# Patient Record
Sex: Male | Born: 1935
Health system: Southern US, Community
[De-identification: ages and names within clinical notes are randomized; demographics above are authoritative.]

## PROBLEM LIST (undated history)

## (undated) DIAGNOSIS — I350 Nonrheumatic aortic (valve) stenosis: Secondary | ICD-10-CM

## (undated) DIAGNOSIS — E785 Hyperlipidemia, unspecified: Secondary | ICD-10-CM

## (undated) DIAGNOSIS — I5032 Chronic diastolic (congestive) heart failure: Secondary | ICD-10-CM

## (undated) DIAGNOSIS — I10 Essential (primary) hypertension: Secondary | ICD-10-CM

## (undated) DIAGNOSIS — M4800 Spinal stenosis, site unspecified: Secondary | ICD-10-CM

## (undated) HISTORY — PX: TONSILLECTOMY: SUR1361

## (undated) HISTORY — DX: Essential (primary) hypertension: I10

## (undated) HISTORY — PX: EYE SURGERY: SHX253

## (undated) HISTORY — DX: Hyperlipidemia, unspecified: E78.5

## (undated) HISTORY — PX: BIOPSY BREAST: PRO8

---

## 2006-11-02 ENCOUNTER — Encounter: Admission: RE | Admit: 2006-11-02 | Discharge: 2006-11-02 | Payer: Self-pay | Admitting: Internal Medicine

## 2012-01-30 ENCOUNTER — Other Ambulatory Visit: Payer: Self-pay | Admitting: Oncology

## 2017-04-22 ENCOUNTER — Ambulatory Visit
Admission: RE | Admit: 2017-04-22 | Discharge: 2017-04-22 | Disposition: A | Discharge status: Home / Self Care | Payer: Medicare Other | Source: Ambulatory Visit | Attending: Internal Medicine | Admitting: Internal Medicine

## 2017-04-22 ENCOUNTER — Other Ambulatory Visit: Payer: Self-pay | Admitting: Internal Medicine

## 2017-04-22 DIAGNOSIS — K5900 Constipation, unspecified: Secondary | ICD-10-CM

## 2017-05-13 ENCOUNTER — Emergency Department (HOSPITAL_COMMUNITY): Payer: Medicare Other

## 2017-05-13 ENCOUNTER — Emergency Department (HOSPITAL_COMMUNITY)
Admission: EM | Admit: 2017-05-13 | Discharge: 2017-05-13 | Disposition: A | Discharge status: Home / Self Care | Payer: Medicare Other | Attending: Emergency Medicine | Admitting: Emergency Medicine

## 2017-05-13 ENCOUNTER — Other Ambulatory Visit: Payer: Self-pay

## 2017-05-13 ENCOUNTER — Encounter (HOSPITAL_COMMUNITY): Payer: Self-pay | Admitting: *Deleted

## 2017-05-13 DIAGNOSIS — R1084 Generalized abdominal pain: Secondary | ICD-10-CM | POA: Diagnosis not present

## 2017-05-13 DIAGNOSIS — K5900 Constipation, unspecified: Secondary | ICD-10-CM | POA: Diagnosis not present

## 2017-05-13 DIAGNOSIS — I10 Essential (primary) hypertension: Secondary | ICD-10-CM | POA: Insufficient documentation

## 2017-05-13 DIAGNOSIS — R0789 Other chest pain: Secondary | ICD-10-CM | POA: Insufficient documentation

## 2017-05-13 DIAGNOSIS — R109 Unspecified abdominal pain: Secondary | ICD-10-CM | POA: Diagnosis present

## 2017-05-13 DIAGNOSIS — I159 Secondary hypertension, unspecified: Secondary | ICD-10-CM

## 2017-05-13 HISTORY — DX: Spinal stenosis, site unspecified: M48.00

## 2017-05-13 LAB — BASIC METABOLIC PANEL WITH GFR
Anion gap: 9 (ref 5–15)
BUN: 20 mg/dL (ref 6–20)
CO2: 23 mmol/L (ref 22–32)
Calcium: 9.5 mg/dL (ref 8.9–10.3)
Chloride: 104 mmol/L (ref 101–111)
Creatinine, Ser: 1.17 mg/dL (ref 0.61–1.24)
GFR calc Af Amer: >60 mL/min
GFR calc non Af Amer: 55 mL/min — ABNORMAL LOW
Glucose, Bld: 119 mg/dL — ABNORMAL HIGH (ref 65–99)
Potassium: 4 mmol/L (ref 3.5–5.1)
Sodium: 136 mmol/L (ref 135–145)

## 2017-05-13 LAB — URINALYSIS, ROUTINE W REFLEX MICROSCOPIC
Bilirubin Urine: NEGATIVE
GLUCOSE, UA: NEGATIVE mg/dL
Hgb urine dipstick: NEGATIVE
Ketones, ur: NEGATIVE mg/dL
LEUKOCYTES UA: NEGATIVE
Nitrite: NEGATIVE
PROTEIN: NEGATIVE mg/dL
Specific Gravity, Urine: 1.025 (ref 1.005–1.030)
pH: 5 (ref 5.0–8.0)

## 2017-05-13 LAB — HEPATIC FUNCTION PANEL
ALBUMIN: 3.8 g/dL (ref 3.5–5.0)
ALK PHOS: 59 U/L (ref 38–126)
ALT: 13 U/L — ABNORMAL LOW (ref 17–63)
AST: 21 U/L (ref 15–41)
BILIRUBIN INDIRECT: 0.8 mg/dL (ref 0.3–0.9)
Bilirubin, Direct: 0.2 mg/dL (ref 0.1–0.5)
TOTAL PROTEIN: 6.4 g/dL — AB (ref 6.5–8.1)
Total Bilirubin: 1 mg/dL (ref 0.3–1.2)

## 2017-05-13 LAB — CBC
HCT: 38.8 % — ABNORMAL LOW (ref 39.0–52.0)
HEMOGLOBIN: 12.9 g/dL — AB (ref 13.0–17.0)
MCH: 30.6 pg (ref 26.0–34.0)
MCHC: 33.2 g/dL (ref 30.0–36.0)
MCV: 91.9 fL (ref 78.0–100.0)
Platelets: 214 10*3/uL (ref 150–400)
RBC: 4.22 MIL/uL (ref 4.22–5.81)
RDW: 13.4 % (ref 11.5–15.5)
WBC: 6.8 10*3/uL (ref 4.0–10.5)

## 2017-05-13 LAB — LIPASE, BLOOD: LIPASE: 40 U/L (ref 11–51)

## 2017-05-13 LAB — TROPONIN I: Troponin I: <0.03 ng/mL

## 2017-05-13 LAB — I-STAT TROPONIN, ED: TROPONIN I, POC: 0.02 ng/mL (ref 0.00–0.08)

## 2017-05-13 MED ORDER — MAGNESIUM CITRATE PO SOLN
1.0000 | Freq: Once | ORAL | Status: AC
  Administered 2017-05-13: 1 via ORAL
  Filled 2017-05-13: qty 296

## 2017-05-13 MED ORDER — IOPAMIDOL (ISOVUE-300) INJECTION 61%
INTRAVENOUS | Status: AC
  Administered 2017-05-13: 100 mL
  Filled 2017-05-13: qty 100

## 2017-05-13 NOTE — ED Triage Notes (Addendum)
Pt c/o "gas pains;" then tonight had pain in bilateral arms. Pt denies chest pain. Wife reports pt has "narrowing in the aorta" and has been told by pcp that if pt has CP to come to the ER. Wife also reports pt has had swelling in hands and feet  Pt denies any pain or discomfort at present

## 2017-05-13 NOTE — Discharge Instructions (Signed)
Please follow up with Dr. Valentina Lucks regarding your CT findings.  Take magnesium citrate, half a bottle today and another half tomorrow. You can also do enemas at home to help relieve your bowels. Discuss with Dr. Valentina Lucks your blood pressure medications.

## 2017-05-13 NOTE — ED Notes (Signed)
Patient transported to CT 

## 2017-05-13 NOTE — ED Provider Notes (Signed)
[facility]-EMERGENCY DEPT Provider Note   CSN: [id number] Arrival date & time: 05/13/17  0208     History   Chief Complaint Chief Complaint  Patient presents with  . Abdominal Pain  . Chest Pain    HPI Marcus Powers is a 83 y.o. male.  HPI  Marcus Powers is a 83 y.o. male with history of hypertension, presents to emergency department complaining of abdominal pain and chest discomfort. Patient states that he has been having issues with constipation over the last month. He has been to his primary care doctor and is taking MiraLAX and a laxative. He states he also had an enema 2 weeks ago. He states his constipation is not improving. He states that last night he was in bed, and developed diffuse abdominal discomfort that radiated into his chest. He also stated that he had some tingling in bilateral arms which alarmed him and he decided to come to emergency department. He does not remember last time he had a bowel movement. He also reports urinary frequency and states he has to sit down on the toilet to urinate because sometimes he is unable to control it. He denies any nausea or vomiting. No fever or chills. He denies any chest pain at this time. He also admits to swelling and bilateral hands and ankles over the last few weeks. He states that his doctor did take him off of one of the blood pressure medicines and he has been drinking more fluids. No medications taken prior to her coming in.   Past Medical History:  Diagnosis Date  . Spinal stenosis     There are no active problems to display for this patient.   Past Surgical History:  Procedure Laterality Date  . BIOPSY BREAST         Home Medications    Prior to Admission medications   Not on File    Family History No family history on file.  Social History Social History  Substance Use Topics  . Smoking status: Never Smoker  . Smokeless tobacco: Never Used  . Alcohol use No     Allergies   Patient has no known  allergies.   Review of Systems Review of Systems  Constitutional: Negative for chills and fever.  Respiratory: Negative for cough, chest tightness and shortness of breath.   Cardiovascular: Positive for chest pain. Negative for palpitations and leg swelling.  Gastrointestinal: Positive for abdominal pain and constipation. Negative for abdominal distention, diarrhea, nausea and vomiting.  Genitourinary: Positive for frequency and urgency. Negative for dysuria, hematuria, scrotal swelling and testicular pain.  Musculoskeletal: Negative for arthralgias, myalgias, neck pain and neck stiffness.  Skin: Negative for rash.  Allergic/Immunologic: Negative for immunocompromised state.  Neurological: Negative for dizziness, weakness, light-headedness, numbness and headaches.  All other systems reviewed and are negative.    Physical Exam Updated Vital Signs BP (!) 174/75   Pulse 75   Temp 98 F (36.7 C) (Oral)   Resp 15   SpO2 97%   Physical Exam  Constitutional: He is oriented to person, place, and time. He appears well-developed and well-nourished. No distress.  HENT:  Head: Normocephalic and atraumatic.  Eyes: Conjunctivae are normal.  Neck: Neck supple.  Cardiovascular: Normal rate, regular rhythm, normal heart sounds and intact distal pulses.   No murmur heard. Pulmonary/Chest: Effort normal. No respiratory distress. He has no wheezes. He has no rales.  Abdominal: Soft. Bowel sounds are normal. He exhibits no distension. There is tenderness. There is no rebound.  Diffuse tenderness  Musculoskeletal: He exhibits no edema.  Neurological: He is alert and oriented to person, place, and time.  Skin: Skin is warm and dry.  Nursing note and vitals reviewed.    ED Treatments / Results  Labs (all labs ordered are listed, but only abnormal results are displayed) Labs Reviewed  BASIC METABOLIC PANEL - Abnormal; Notable for the following:       Result Value   Glucose, Bld 119 (*)     GFR calc non Af Amer 55 (*)    All other components within normal limits  CBC - Abnormal; Notable for the following:    Hemoglobin 12.9 (*)    HCT 38.8 (*)    All other components within normal limits  URINALYSIS, ROUTINE W REFLEX MICROSCOPIC  HEPATIC FUNCTION PANEL  LIPASE, BLOOD  I-STAT TROPONIN, ED  I-STAT TROPONIN, ED    EKG  EKG Interpretation None       Radiology Dg Chest 2 View  Result Date: 05/13/2017 CLINICAL DATA:  Acute onset of generalized chest pain, radiating to the shoulder. Initial encounter. EXAM: CHEST  2 VIEW COMPARISON:  None. FINDINGS: The lungs are well-aerated and clear. There is no evidence of focal opacification, pleural effusion or pneumothorax. The heart is normal in size; the mediastinal contour is within normal limits. No acute osseous abnormalities are seen. IMPRESSION: No acute cardiopulmonary process seen. Electronically Signed   By: Roanna Raider M.D.   On: 05/13/2017 02:44    Procedures Procedures (including critical care time)  Medications Ordered in ED Medications - No data to display   Initial Impression / Assessment and Plan / ED Course  I have reviewed the triage vital signs and the nursing notes.  Pertinent labs & imaging results that were available during my care of the patient were reviewed by me and considered in my medical decision making (see chart for details).     Patient in emergency department with abdominal pain and chest pain. He has been in department for almost 8 hours prior to me seeing him. He is hypertensive, otherwise vital signs have remained normal. His abdomen is diffusely tender, question constipation. Given severity of the pain earlier, will get CT abdomen and pelvis for further evaluation. Will repeat troponin, initial was negative. Also will add hepatic function panel and lipase and UA   12:10 PM CT is with no acute findings other than constipation. Nonemergent findings were discussed with patient and he will  follow-up with family doctor for further evaluation. Discussed with Dr. Fayrene Fearing who has seen patient as well. Will treat with magnesium citrate for constipation, at home enemas as needed. Discussed blood pressure control, patient was recently taken off of some blood pressure medications, advised him to speak with Dr. Valentina Lucks to see if he needs to restart that. Otherwise patient is in no acute distress. 2 troponins are negative. I do not think this is cardiac. Will have him follow-up closely. Return precautions discussed.  Vitals:   05/13/17 0221 05/13/17 0720 05/13/17 0915 05/13/17 1210  BP: (!) 163/80 (!) 158/64 (!) 174/75 (!) 166/76  Pulse: 83 77 75 66  Resp: 18 (!) 24 15 17   Temp: 98 F (36.7 C)     TempSrc: Oral     SpO2: 100% 100% 97% 98%     Final Clinical Impressions(s) / ED Diagnoses   Final diagnoses:  Generalized abdominal pain  Constipation, unspecified constipation type  Secondary hypertension  Atypical chest pain    New Prescriptions New Prescriptions  No medications on file     Jaynie Crumble, Cordelia Poche 05/13/17 1759    Rolland Porter, MD 05/14/17 1043

## 2019-12-01 ENCOUNTER — Ambulatory Visit: Payer: Medicare Other

## 2020-10-23 DIAGNOSIS — E7800 Pure hypercholesterolemia, unspecified: Secondary | ICD-10-CM | POA: Diagnosis not present

## 2020-10-23 DIAGNOSIS — I10 Essential (primary) hypertension: Secondary | ICD-10-CM | POA: Diagnosis not present

## 2020-12-25 DIAGNOSIS — G3184 Mild cognitive impairment, so stated: Secondary | ICD-10-CM | POA: Diagnosis not present

## 2020-12-26 DIAGNOSIS — E7800 Pure hypercholesterolemia, unspecified: Secondary | ICD-10-CM | POA: Diagnosis not present

## 2020-12-26 DIAGNOSIS — I10 Essential (primary) hypertension: Secondary | ICD-10-CM | POA: Diagnosis not present

## 2021-02-09 ENCOUNTER — Emergency Department (HOSPITAL_COMMUNITY)
Admission: EM | Admit: 2021-02-09 | Discharge: 2021-02-10 | Disposition: A | Discharge status: Home / Self Care | Payer: Medicare Other | Attending: Emergency Medicine | Admitting: Emergency Medicine

## 2021-02-09 ENCOUNTER — Encounter (HOSPITAL_COMMUNITY): Payer: Self-pay

## 2021-02-09 ENCOUNTER — Emergency Department (HOSPITAL_COMMUNITY): Payer: Medicare Other

## 2021-02-09 ENCOUNTER — Other Ambulatory Visit: Payer: Self-pay

## 2021-02-09 DIAGNOSIS — R062 Wheezing: Secondary | ICD-10-CM | POA: Insufficient documentation

## 2021-02-09 DIAGNOSIS — R0602 Shortness of breath: Secondary | ICD-10-CM | POA: Diagnosis not present

## 2021-02-09 DIAGNOSIS — Z20822 Contact with and (suspected) exposure to covid-19: Secondary | ICD-10-CM | POA: Insufficient documentation

## 2021-02-09 DIAGNOSIS — R609 Edema, unspecified: Secondary | ICD-10-CM

## 2021-02-09 DIAGNOSIS — E8770 Fluid overload, unspecified: Secondary | ICD-10-CM | POA: Insufficient documentation

## 2021-02-09 DIAGNOSIS — R600 Localized edema: Secondary | ICD-10-CM | POA: Diagnosis not present

## 2021-02-09 DIAGNOSIS — J9811 Atelectasis: Secondary | ICD-10-CM | POA: Diagnosis not present

## 2021-02-09 DIAGNOSIS — R011 Cardiac murmur, unspecified: Secondary | ICD-10-CM | POA: Diagnosis not present

## 2021-02-09 MED ORDER — ALBUTEROL SULFATE HFA 108 (90 BASE) MCG/ACT IN AERS
2.0000 | INHALATION_SPRAY | RESPIRATORY_TRACT | Status: DC | PRN
  Administered 2021-02-09: 2 via RESPIRATORY_TRACT
  Filled 2021-02-09: qty 6.7

## 2021-02-09 NOTE — ED Triage Notes (Signed)
Patient reports shortness of breathing starting yesterday but worsening this morning, denies any chest pain.

## 2021-02-10 ENCOUNTER — Telehealth: Payer: Self-pay

## 2021-02-10 DIAGNOSIS — R600 Localized edema: Secondary | ICD-10-CM | POA: Diagnosis not present

## 2021-02-10 DIAGNOSIS — R0602 Shortness of breath: Secondary | ICD-10-CM | POA: Diagnosis not present

## 2021-02-10 LAB — COMPREHENSIVE METABOLIC PANEL
ALT: 14 U/L (ref 0–44)
AST: 16 U/L (ref 15–41)
Albumin: 3.7 g/dL (ref 3.5–5.0)
Alkaline Phosphatase: 56 U/L (ref 38–126)
Anion gap: 6 (ref 5–15)
BUN: 14 mg/dL (ref 8–23)
CO2: 24 mmol/L (ref 22–32)
Calcium: 9.1 mg/dL (ref 8.9–10.3)
Chloride: 109 mmol/L (ref 98–111)
Creatinine, Ser: 1.26 mg/dL — ABNORMAL HIGH (ref 0.61–1.24)
GFR, Estimated: 54 mL/min — ABNORMAL LOW (ref >60)
Glucose, Bld: 129 mg/dL — ABNORMAL HIGH (ref 70–99)
Potassium: 3.4 mmol/L — ABNORMAL LOW (ref 3.5–5.1)
Sodium: 139 mmol/L (ref 135–145)
Total Bilirubin: 0.9 mg/dL (ref 0.3–1.2)
Total Protein: 6.5 g/dL (ref 6.5–8.1)

## 2021-02-10 LAB — CBC WITH DIFFERENTIAL/PLATELET
Abs Immature Granulocytes: 0.01 10*3/uL (ref 0.00–0.07)
Basophils Absolute: 0.1 10*3/uL (ref 0.0–0.1)
Basophils Relative: 1 %
Eosinophils Absolute: 0.1 10*3/uL (ref 0.0–0.5)
Eosinophils Relative: 1 %
HCT: 35.9 % — ABNORMAL LOW (ref 39.0–52.0)
Hemoglobin: 11.6 g/dL — ABNORMAL LOW (ref 13.0–17.0)
Immature Granulocytes: 0 %
Lymphocytes Relative: 16 %
Lymphs Abs: 1.1 10*3/uL (ref 0.7–4.0)
MCH: 31.5 pg (ref 26.0–34.0)
MCHC: 32.3 g/dL (ref 30.0–36.0)
MCV: 97.6 fL (ref 80.0–100.0)
Monocytes Absolute: 0.8 10*3/uL (ref 0.1–1.0)
Monocytes Relative: 12 %
Neutro Abs: 4.6 10*3/uL (ref 1.7–7.7)
Neutrophils Relative %: 70 %
Platelets: 188 10*3/uL (ref 150–400)
RBC: 3.68 MIL/uL — ABNORMAL LOW (ref 4.22–5.81)
RDW: 13.8 % (ref 11.5–15.5)
WBC: 6.7 10*3/uL (ref 4.0–10.5)
nRBC: 0 % (ref 0.0–0.2)

## 2021-02-10 LAB — TROPONIN I (HIGH SENSITIVITY)
Troponin I (High Sensitivity): 61 ng/L — ABNORMAL HIGH (ref <18)
Troponin I (High Sensitivity): 61 ng/L — ABNORMAL HIGH (ref <18)

## 2021-02-10 LAB — RESP PANEL BY RT-PCR (FLU A&B, COVID) ARPGX2
Influenza A by PCR: NEGATIVE
Influenza B by PCR: NEGATIVE
SARS Coronavirus 2 by RT PCR: NEGATIVE

## 2021-02-10 LAB — BRAIN NATRIURETIC PEPTIDE: B Natriuretic Peptide: 826.7 pg/mL — ABNORMAL HIGH (ref 0.0–100.0)

## 2021-02-10 LAB — D-DIMER, QUANTITATIVE: D-Dimer, Quant: 0.59 ug/mL-FEU — ABNORMAL HIGH (ref 0.00–0.50)

## 2021-02-10 MED ORDER — FUROSEMIDE 40 MG PO TABS: 40.0000 mg | ORAL_TABLET | Freq: Every day | ORAL | 0 refills | Status: DC

## 2021-02-10 MED ORDER — FUROSEMIDE 10 MG/ML IJ SOLN
40.0000 mg | Freq: Once | INTRAMUSCULAR | Status: AC
  Administered 2021-02-10: 40 mg via INTRAVENOUS
  Filled 2021-02-10: qty 4

## 2021-02-10 MED ORDER — POTASSIUM CHLORIDE CRYS ER 20 MEQ PO TBCR: 40.0000 meq | EXTENDED_RELEASE_TABLET | Freq: Every day | ORAL | 0 refills | Status: DC

## 2021-02-10 NOTE — Telephone Encounter (Signed)
Ms ClPP lled to state her husbands prescriptions were never called in. In reviewing the chart her scripts were pritned. She went over the packet, they were not in there. This RNCM called them in to the pharmacy as written.

## 2021-02-10 NOTE — ED Provider Notes (Signed)
[facility] EMERGENCY DEPARTMENT Provider Note   CSN: [id number] Arrival date & time: 02/09/21  2224     History Chief Complaint  Patient presents with  . Shortness of Breath    Marcus Powers is a 87 y.o. male.  87 year old male who presents emerged from today with shortness of breath.  His wife is with him and states her last couple days of progressively worsening audible wheezing.  Never anything like that before.  Does not have a history of smoking.  No recent illnesses.  No cough or fevers.  When I noticed the lower extremity swelling he states that that been going on here recently.  The wife states that she is not sure how long its been present   Shortness of Breath      Past Medical History:  Diagnosis Date  . Spinal stenosis     There are no problems to display for this patient.   Past Surgical History:  Procedure Laterality Date  . BIOPSY BREAST         History reviewed. No pertinent family history.  Social History   Tobacco Use  . Smoking status: Never Smoker  . Smokeless tobacco: Never Used  Substance Use Topics  . Alcohol use: No  . Drug use: No    Home Medications Prior to Admission medications   Medication Sig Start Date End Date Taking? Authorizing Provider  acetaminophen (TYLENOL) 500 MG tablet Take 1,000 mg by mouth every 6 (six) hours as needed for mild pain.   Yes [provider]  furosemide (LASIX) 40 MG tablet Take 1 tablet (40 mg total) by mouth daily for 5 days. 02/10/21 02/15/21 Yes Sheddrick Lattanzio, Barbara Cower, MD  hydrochlorothiazide (HYDRODIURIL) 12.5 MG tablet Take 12.5 mg by mouth daily. 12/31/20  Yes [provider]  losartan (COZAAR) 100 MG tablet Take 100 mg by mouth daily. 12/31/20  Yes [provider]  potassium chloride SA (KLOR-CON) 20 MEQ tablet Take 2 tablets (40 mEq total) by mouth daily for 5 days. 02/10/21 02/15/21 Yes Artia Singley, Barbara Cower, MD  pravastatin (PRAVACHOL) 20 MG tablet Take 20 mg by mouth  daily. 12/31/20  Yes [provider]    Allergies    Patient has no known allergies.  Review of Systems   Review of Systems  Respiratory: Positive for shortness of breath.   All other systems reviewed and are negative.   Physical Exam Updated Vital Signs BP (!) 149/96   Pulse 89   Temp 98.1 F (36.7 C) (Oral)   Resp (!) 21   Ht 5\' 5"  (1.651 m)   Wt 73.5 kg   SpO2 99%   BMI 26.96 kg/m   Physical Exam Vitals and nursing note reviewed.  Constitutional:      Appearance: He is well-developed.  HENT:     Head: Normocephalic and atraumatic.  Neck:     Vascular: JVD present.  Cardiovascular:     Rate and Rhythm: Normal rate.     Heart sounds: Murmur heard.    Pulmonary:     Effort: Pulmonary effort is normal. No respiratory distress.     Breath sounds: Examination of the right-lower field reveals rales. Examination of the left-lower field reveals rales. Rales present.  Abdominal:     General: There is no distension.  Musculoskeletal:        General: Normal range of motion.     Cervical back: Normal range of motion.     Right lower leg: Edema present.  Left lower leg: Edema present.  Skin:    General: Skin is warm and dry.  Neurological:     Mental Status: He is alert.     ED Results / Procedures / Treatments   Labs (all labs ordered are listed, but only abnormal results are displayed) Labs Reviewed  CBC WITH DIFFERENTIAL/PLATELET - Abnormal; Notable for the following components:      Result Value   RBC 3.68 (*)    Hemoglobin 11.6 (*)    HCT 35.9 (*)    All other components within normal limits  COMPREHENSIVE METABOLIC PANEL - Abnormal; Notable for the following components:   Potassium 3.4 (*)    Glucose, Bld 129 (*)    Creatinine, Ser 1.26 (*)    GFR, Estimated 54 (*)    All other components within normal limits  BRAIN NATRIURETIC PEPTIDE - Abnormal; Notable for the following components:   B Natriuretic Peptide 826.7 (*)    All other  components within normal limits  D-DIMER, QUANTITATIVE - Abnormal; Notable for the following components:   D-Dimer, Quant 0.59 (*)    All other components within normal limits  TROPONIN I (HIGH SENSITIVITY) - Abnormal; Notable for the following components:   Troponin I (High Sensitivity) 61 (*)    All other components within normal limits  TROPONIN I (HIGH SENSITIVITY) - Abnormal; Notable for the following components:   Troponin I (High Sensitivity) 61 (*)    All other components within normal limits  RESP PANEL BY RT-PCR (FLU A&B, COVID) [id number]    EKG EKG Interpretation  Date/Time:  Saturday Feb 09 2021 22:35:17 EDT Ventricular Rate:  92 PR Interval:  164 QRS Duration: 88 QT Interval:  378 QTC Calculation: 467 R Axis:   60 Text Interpretation: Normal sinus rhythm Possible Left atrial enlargement Left ventricular hypertrophy with repolarization abnormality ( Sokolow-Lyon ) Abnormal ECG Confirmed by Marily Memos [phone]) on 02/09/2021 11:21:49 PM   Radiology DG Chest 2 View  Result Date: 02/09/2021 CLINICAL DATA:  Shortness of breath EXAM: CHEST - 2 VIEW COMPARISON:  05/13/2017 FINDINGS: Cardiac shadow is within normal limits. Aortic calcifications are noted. Lungs are well aerated bilaterally. Very mild left basilar atelectasis is noted posteriorly. No sizable effusion noted. No acute bony abnormality is seen. IMPRESSION: Mild left basilar atelectasis. Electronically Signed   By: Alcide Clever M.D.   On: 02/09/2021 22:57    Procedures Procedures   Medications Ordered in ED Medications  albuterol (VENTOLIN HFA) 108 (90 Base) MCG/ACT inhaler 2 puff (2 puffs Inhalation Given 02/09/21 2349)  furosemide (LASIX) injection 40 mg (40 mg Intravenous Given 02/10/21 0142)    ED Course  I have reviewed the triage vital signs and the nursing notes.  Pertinent labs & imaging results that were available during my care of the patient were reviewed by me and considered in my medical decision  making (see chart for details).    MDM Rules/Calculators/A&P                         Suspect pulmonry edema, will eval for same. Has h/o some type of valve problem but unsure of which one or what the problem is.  Patient was diuresed in the emergency room a significant amount of urine output.  Patient with improvement.  His vital signs are stable.  He is not tachypneic or hypoxic.  Blood pressure started to improve.  He started to feel better.  We will initiate a 3-day course  of Lasix at home with cardiology referral for management of new onset heart failure.  Final Clinical Impression(s) / ED Diagnoses Final diagnoses:  Fluid retention    Rx / DC Orders ED Discharge Orders         Ordered    furosemide (LASIX) 40 MG tablet  Daily        02/10/21 0322    Ambulatory referral to Cardiology        02/10/21 0322    potassium chloride SA (KLOR-CON) 20 MEQ tablet  Daily        02/10/21 0323           Donita Newland, Barbara Cower, MD 02/10/21 [phone]

## 2021-02-15 DIAGNOSIS — I350 Nonrheumatic aortic (valve) stenosis: Secondary | ICD-10-CM | POA: Diagnosis not present

## 2021-02-15 DIAGNOSIS — I5030 Unspecified diastolic (congestive) heart failure: Secondary | ICD-10-CM | POA: Diagnosis not present

## 2021-02-18 ENCOUNTER — Other Ambulatory Visit (HOSPITAL_COMMUNITY): Payer: Self-pay | Admitting: Internal Medicine

## 2021-02-18 DIAGNOSIS — I5030 Unspecified diastolic (congestive) heart failure: Secondary | ICD-10-CM

## 2021-02-28 ENCOUNTER — Ambulatory Visit (HOSPITAL_COMMUNITY)
Admission: RE | Admit: 2021-02-28 | Discharge: 2021-02-28 | Disposition: A | Discharge status: Home / Self Care | Payer: Medicare Other | Source: Ambulatory Visit | Attending: Internal Medicine | Admitting: Internal Medicine

## 2021-02-28 ENCOUNTER — Other Ambulatory Visit: Payer: Self-pay

## 2021-02-28 DIAGNOSIS — I5030 Unspecified diastolic (congestive) heart failure: Secondary | ICD-10-CM | POA: Diagnosis not present

## 2021-02-28 DIAGNOSIS — I080 Rheumatic disorders of both mitral and aortic valves: Secondary | ICD-10-CM | POA: Diagnosis not present

## 2021-02-28 LAB — ECHOCARDIOGRAM COMPLETE
AR max vel: 0.92 cm2
AV Area VTI: 0.9 cm2
AV Area mean vel: 0.83 cm2
AV Mean grad: 47.3 mmHg
AV Peak grad: 74.5 mmHg
Ao pk vel: 4.32 m/s
Area-P 1/2: 2.76 cm2
Calc EF: 52.1 %
P 1/2 time: 351 msec
S' Lateral: 3.4 cm
Single Plane A2C EF: 54.7 %
Single Plane A4C EF: 49.9 %

## 2021-02-28 NOTE — Progress Notes (Signed)
  Echocardiogram 2D Echocardiogram has been performed.  Tye Savoy 02/28/2021, 11:26 AM

## 2021-03-08 DIAGNOSIS — I10 Essential (primary) hypertension: Secondary | ICD-10-CM | POA: Diagnosis not present

## 2021-03-08 DIAGNOSIS — E7800 Pure hypercholesterolemia, unspecified: Secondary | ICD-10-CM | POA: Diagnosis not present

## 2021-03-08 DIAGNOSIS — I5030 Unspecified diastolic (congestive) heart failure: Secondary | ICD-10-CM | POA: Diagnosis not present

## 2021-04-15 ENCOUNTER — Ambulatory Visit: Payer: Self-pay | Admitting: Cardiology

## 2021-04-19 NOTE — Progress Notes (Signed)
Patient referred by Lavone Orn, MD for aortic stenosis  Subjective:   Marcus Powers, male    DOB: [birth year], 87 y.o.   MRN: [id number]   Chief Complaint  Patient presents with   Congestive heart failure with preserved left ventricular fu   Aortic Stenosis   New Patient (Initial Visit)     HPI  87 y.o. African-American male with hypertension, hyperlipidemia, severe aortic stenosis.  Patient is here today with his son-in-law [name].  Patient lives with his wife, daughter, and son-in-law.  He is fairly ambulatory at home with a cane.  She also mows his lawn using a right more.  He does not have overt chest pain, shortness of breath, orthopnea, PND symptoms.  He has only occasional leg edema.  He was in the ER in 02/2021 with wheezing and shortness of breath.  Chest x-ray showed some atelectasis but no obvious edema.  His BNP was over 800.  He was given Lasix for 5 days.  He has not had any recurrence of such symptoms since then.  Reviewed external echocardiogram findings, and personally interpreted them.   Past Medical History:  Diagnosis Date   Spinal stenosis      Past Surgical History:  Procedure Laterality Date   BIOPSY BREAST       Social History   Tobacco Use  Smoking Status Never  Smokeless Tobacco Never    Social History   Substance and Sexual Activity  Alcohol Use No     Family History  Problem Relation Age of Onset   Cancer Mother    Stroke Father      Current Outpatient Medications on File Prior to Visit  Medication Sig Dispense Refill   acetaminophen (TYLENOL) 500 MG tablet Take 1,000 mg by mouth every 6 (six) hours as needed for mild pain.     furosemide (LASIX) 40 MG tablet Take 1 tablet (40 mg total) by mouth daily for 5 days. 5 tablet 0   hydrochlorothiazide (HYDRODIURIL) 12.5 MG tablet Take 12.5 mg by mouth daily.     losartan (COZAAR) 100 MG tablet Take 100 mg by mouth daily.     potassium chloride SA (KLOR-CON) 20 MEQ tablet Take 2  tablets (40 mEq total) by mouth daily for 5 days. 10 tablet 0   pravastatin (PRAVACHOL) 20 MG tablet Take 20 mg by mouth daily.     No current facility-administered medications on file prior to visit.    Cardiovascular and other pertinent studies:  EKG 04/22/2021: Sinus rhythm 90 bpm Left atrial enlargement.  Left ventricular hypertrophy Diffuse nonspecific T-abnormality, likely due to LVH  Echocardiogram 02/28/2021:  1. Left ventricular ejection fraction, by estimation, is 50 to 55%. The  left ventricle has low normal function. The left ventricle has no regional  wall motion abnormalities. There is mild left ventricular hypertrophy.  Left ventricular diastolic parameters are consistent with  Grade I diastolic dysfunction (impaired  relaxation).   2. Right ventricular systolic function is normal. The right ventricular  size is normal. There is normal pulmonary artery systolic pressure.   3. Left atrial size was mildly dilated.   4. The mitral valve is normal in structure. Mild mitral valve  regurgitation. No evidence of mitral stenosis.   5. The aortic valve has an indeterminant number of cusps. Aortic valve  regurgitation is mild. Severe aortic valve stenosis.  Vmax 4.5 m/sec, mean PG 47 mmHg, AVA 0.9 cm2.   Recent labs: 03/05/2021: Glucose 95, BUN/Cr 16/1.18. EGFR 70.  HbA1C 6.3% Chol 223, TG 163, HDL 76, LDL 119 TSH N/A    Review of Systems  Cardiovascular:  Negative for chest pain, dyspnea on exertion, leg swelling (Occasional), palpitations and syncope.        Vitals:   04/22/21 1323  BP: 132/64  Pulse: 91  Resp: 16  Temp: 98 F (36.7 C)  SpO2: 97%     Body mass index is 25.46 kg/m. Filed Weights   04/22/21 1323  Weight: 153 lb (69.4 kg)     Objective:   Physical Exam Vitals and nursing note reviewed.  Constitutional:      General: He is not in acute distress. Neck:     Vascular: No JVD.  Cardiovascular:     Rate and Rhythm: Normal rate and  regular rhythm.     Pulses: Normal pulses.     Heart sounds: Murmur heard.  Harsh midsystolic murmur is present with a grade of 4/6 at the upper right sternal border radiating to the neck.  Pulmonary:     Effort: Pulmonary effort is normal.     Breath sounds: Normal breath sounds. No wheezing or rales.  Musculoskeletal:     Right lower leg: Edema (Trace) present.     Left lower leg: No edema.           Assessment & Recommendations:   87 y.o. African-American male with hypertension, hyperlipidemia, severe aortic stenosis.   HFpEF: Compensated.  Aortic stenosis: Severe by transthoracic echocardiogram 02/2021.  Number of cusps not clearly delineated. He is minimally symptomatic.  He had 1 episode of mild decompensation of HFpEF in 02/2021.  Otherwise, only has minimal leg swelling.  More importantly, patient is quite reluctant to consider valve replacement at this time.  I explained the natural history of aortic stenosis, and option of TAVR which is minimally invasive.  However, patient tells me today, " if he calls me, I must answer", while talking about possibility of sudden death from severe aortic stenosis.  Patient understands the risks of delaying TAVR. We decided to hold off work-up for TAVR.  I will repeat echocardiogram in 3 months and see him for follow-up after that.  Okay to take Lasix as needed for worsening leg edema shortness of breath.  Hypertension: Controlled.  Thank you for referring the patient to [place]. Please feel free to contact with any questions.   Nigel Mormon, MD Pager: [phone] Office: [phone]

## 2021-04-22 ENCOUNTER — Ambulatory Visit: Payer: Medicare Other | Admitting: Cardiology

## 2021-04-22 ENCOUNTER — Encounter: Payer: Self-pay | Admitting: Cardiology

## 2021-04-22 ENCOUNTER — Other Ambulatory Visit: Payer: Self-pay

## 2021-04-22 VITALS — BP 132/64 | HR 91 | Temp 98.0°F | Resp 16 | Ht 65.0 in | Wt 153.0 lb

## 2021-04-22 DIAGNOSIS — I5030 Unspecified diastolic (congestive) heart failure: Secondary | ICD-10-CM | POA: Diagnosis not present

## 2021-04-22 DIAGNOSIS — I10 Essential (primary) hypertension: Secondary | ICD-10-CM | POA: Diagnosis not present

## 2021-04-22 DIAGNOSIS — I350 Nonrheumatic aortic (valve) stenosis: Secondary | ICD-10-CM | POA: Insufficient documentation

## 2021-04-22 NOTE — Patient Instructions (Signed)
Transcatheter Aortic Valve Replacement  Transcatheter aortic valve replacement (TAVR) is a procedure to place an artificial aortic valve in the heart. This procedure is also called transcatheter aortic valve implantation (TAVI). The aortic valve controls blood flow between the heart and the main blood vessel that supplies blood to the body (aorta). TAVR may be needed if the aortic valve is stiff or tight (aortic stenosis) and is causing symptoms. During TAVR, a flexible tube (catheter) is placed through an incision in your groin, chest, or neck and is guided to your aortic valve. Then, the artificial valve is moved through the catheter and into the aortic valve. The artificial valve is then expanded and placed within your old valve. The old valve is not removed. The new valve takes over thefunction of the old valve, and it improves blood flow through your heart. Tell a health care provider about: Any allergies you have. All medicines you are taking, including blood thinners, vitamins, herbs, eye drops, creams, and over-the-counter medicines. Any problems you or family members have had with anesthetic medicines. Any blood disorders you have. Any surgeries you have had. Any medical conditions you have. Whether you are pregnant or may be pregnant. What are the risks? Generally, this is a safe procedure. However, problems may occur, including: Bleeding. Damage to nearby structures or organs, such as damage to nearby blood vessels, damage to the heart, or kidney failure. Stroke. Blood clots. Heart problems, such as abnormal heart rhythms (arrhythmia), heart attack, failure of the artificial valve, or the need for a pacemaker. Allergic reactions to medicines. Infection. What happens before the procedure? Staying hydrated Follow instructions from your health care provider about hydration, which may include: Up to 2 hours before the procedure - you may continue to drink clear liquids, such as water,  clear fruit juice, black coffee, and plain tea.  Eating and drinking restrictions Follow instructions from your health care provider about eating and drinking, which may include: 8 hours before the procedure - stop eating heavy meals or foods, such as meat, fried foods, or fatty foods. 6 hours before the procedure - stop eating light meals or foods, such as toast or cereal. 6 hours before the procedure - stop drinking milk or drinks that contain milk. 2 hours before the procedure - stop drinking clear liquids. Medicines Ask your health care provider about: Changing or stopping your regular medicines. This is especially important if you are taking diabetes medicines or blood thinners. Taking medicines such as aspirin and ibuprofen. These medicines can thin your blood. Do not take these medicines unless your health care provider tells you to take them. Taking over-the-counter medicines, vitamins, herbs, and supplements. You may need to take blood thinners before surgery. If so, take these medicines as directed. Tests You will have testing, including: An echocardiogram (ECG), or ultrasound of the heart, to evaluate your heart function and heart valves. CT scan to evaluate your aortic valve and determine if your valve can be altered with an artificial valve. You may also have: A stress test to evaluate your aortic valve. A coronary angiogram. During this procedure, a catheter is inserted into a blood vessel and guided into your heart to check pressures, check your arteries, and take images of the heart. Tests of lung function. A chest X-ray. You will have blood samples taken to monitor your kidney function and salts and minerals in your blood (electrolytes). General instructions Do not use any products that contain nicotine or tobacco for at least 4 weeks  before the procedure. These products include cigarettes, e-cigarettes, and chewing tobacco. If you need help quitting, ask your health care  provider. Plan to have someone take you home from the hospital or clinic. Plan to have a responsible adult care for you for at least 24 hours after you leave the hospital or clinic. This is important. Ask your health care provider: How your surgery site will be marked. What steps will be taken to help prevent infection. These steps may include: Removing hair at the surgery site. Washing skin with a germ-killing soap. Taking antibiotic medicine. What happens during the procedure? IVs will be inserted into veins in your arms. You will be given one or more of the following: A medicine to help you relax (sedative). A medicine to numb the area (local anesthetic). A medicine to make you fall asleep (general anesthetic). Small incisions will be made in one or more of these places: Your groin. This is the most common place. Your neck. Your upper chest, near your collarbone. The left side of your chest. Catheters will be threaded into your incisions and into blood vessels that lead to your heart. If you have an incision in the left side of your chest, a catheter will be placed directly into your heart. Ongoing X-ray images (fluoroscopy) will be used to guide the catheters to the right places in your heart. A wire will be placed through the catheter inside your aortic valve. A balloon at the end of this catheter may be inflated to open your aortic valve. The wire will then be removed. Another wire will be placed through the catheter inside your aortic valve. This wire will be used to help position and inflate your artificial valve. Then, the wire will be removed. Tests will be done to make sure your new valve and your heart are working properly. Your incisions will be closed with stitches (sutures), skin glue, or adhesive strips. If an incision was made in your chest wall, you may have a chest tube placed to keep your lung from collapsing. Bandages (dressings) will be placed over your  incisions. The procedure may vary among health care providers and hospitals. What happens after the procedure? Your blood pressure, heart rate, breathing rate, and blood oxygen level will be monitored until you leave the hospital or clinic. You may continue to receive fluids and medicines through an IV. If you had a chest incision, you will have some chest pain. You will get pain medicine as needed. You will be given blood thinners. You may need to lie still in bed for several hours. When you are allowed to get up, you will be encouraged to move around. You may have to wear compression stockings. These stockings help to prevent blood clots and reduce swelling in your legs. You will be shown how to do breathing exercises to prevent lung infection. Do not drive or operate machinery until your health care provider says that it is safe. Summary Transcatheter aortic valve replacement (TAVR) is a procedure to place an artificial aortic valve in the heart. You may need TAVR if you have a stiff or tight aortic valve (aortic stenosis) that is causing symptoms. After the procedure, you may need to lie still in bed for several hours. When you are allowed to get up, you will be encouraged to move around. This information is not intended to replace advice given to you by your health care provider. Make sure you discuss any questions you have with your healthcare provider. Document  Revised: 11/09/2019 Document Reviewed: 09/09/2019 Elsevier Patient Education  2022 [employer] Aortic Valve Stenosis  Aortic valve stenosis is a narrowing of the aortic valve in the heart. The aortic valve opens and closes to regulate blood flow between the left side of the heart (left ventricle) and the artery that leads away from the heart (aorta). When the aortic valve becomes narrow, it is difficult for the heart to pump blood out to the body, which causes the heart to work harder. The extra workcan weaken the heart muscle  over time. Aortic valve stenosis can range from mild to severe. If it is not treated, itcan become more severe over time and lead to heart failure. What are the causes? This condition may be caused by: Buildup of calcium around and on the aortic valve. This can occur with aging. This is the most common cause of aortic valve stenosis. A heart problem that developed in the womb (birth defect). Rheumatic fever. Radiation to the chest. What increases the risk? You may be more likely to develop this condition if: You are older than age 53. You were born with an abnormal bicuspid valve. What are the signs or symptoms? You may not have any symptoms until your condition becomes severe. It may take 10-20 years for mild or moderate aortic valve stenosis to become severe. Symptoms may include: Shortness of breath. This may get worse during physical activity. Feeling unusually weak and tired (fatigue). Extreme discomfort in the chest, neck, or arm during physical activity (angina). A heartbeat that is irregular or faster than normal (palpitations). Dizziness or fainting. This may happen when you get physically tired or after you take certain heart medicines, such as nitroglycerin. How is this diagnosed? This condition may be diagnosed with: A physical exam. Echocardiogram. This is a type of imaging test that uses sound waves (ultrasound) to make images of your heart. There are two kinds of this test that may be used. Transthoracic echocardiogram (TTE). For this type, a wand-like tool (transducer) is moved over your chest to create ultrasound images that are recorded by a computer. Transesophageal echocardiogram (TEE). For this type, a flexible tube (probe) is inserted down the part of the body that moves food from your mouth to your stomach (esophagus). The heart and the esophagus are close to each other. Your health care provider will use the probe to take clear, detailed pictures of the heart. Cardiac  catheterization. For this procedure, a small, thin tube (catheter) is passed through a large vein in your neck, groin, or arm. The catheter is used to get information about arteries, structures, blood pressure, and oxygen levels in your heart. Stress tests. These are tests that evaluate the blood supply to your heart and your heart's response to exercise. You may work with a health care provider who specializes in the heart (cardiologist) for diagnosis and treatment. How is this treated? Treatment depends on how severe your condition is and what your symptoms are. You will need to have your heart checked regularly to make sure that your condition is not getting worse or causing serious problems. Treatment may also include: Surgery to replace your aortic valve. This is the most common treatment for aortic valve stenosis, and it is the only treatment to cure the condition. Several types of surgeries are available. The surgery may be done: Through a large incision over your heart (open-heart surgery). Through small incisions, using a flexible tube called a catheter (transcatheter aortic valve replacement, TAVR). Medicines that help to  keep your heart rate regular. Medicines that thin your blood (anticoagulants) to prevent blood clots. Antibiotic medicines to help prevent infection. If your condition is mild, you may only need regular follow-up visits formonitoring. Follow these instructions at home: Lifestyle Limit alcohol intake to no more than 1 drink a day for nonpregnant women and 2 drinks a day for men. One drink equals 12 oz of beer, 5 oz of wine, or 1 oz of hard liquor. Do not use any products that contain nicotine or tobacco, such as cigarettes and e-cigarettes. If you need help quitting, ask your health care provider. Work with your health care provider to manage your blood pressure and cholesterol. Maintain a healthy weight. Eating and drinking  Eat a heart-healthy diet that includes  plenty of fresh fruits and vegetables, whole grains, lean protein, and low-fat or nonfat dairy. Limit how much caffeine you drink. Caffeine can affect your heart's rate and rhythm. Avoid foods that are: High in salt (sodium), saturated fat, or sugar. Canned or highly processed. [place]. Follow instructions from your health care provider about any other eating or drinking restrictions.  Activity Exercise regularly and return to your normal activities as told by your health care provider. Ask your health care provider what amount and type of physical activity is safe for you. If your aortic valve stenosis is mild, you may only need to avoid very intense physical activity, such as heavy weight lifting. The more severe your aortic valve stenosis is, the more activities you may need to avoid. If you are taking blood thinners: Before you take any medicines that contain aspirin or NSAIDs, talk with your health care provider. These medicines increase your risk for dangerous bleeding. Take your medicine exactly as told, at the same time every day. Avoid activities that could cause injury or bruising, and follow instructions about how to prevent falls. Wear a medical alert bracelet or carry a card that lists what medicines you take. General instructions Take over-the-counter and prescription medicines only as told by your health care provider. If you were prescribed an antibiotic, take it as told by your health care provider. Do not stop taking the antibiotic even if you start to feel better. If you are a woman and you plan to become pregnant, talk with your health care provider before you become pregnant. Before you have any type of medical or dental procedure or surgery, tell all health care providers that you have aortic valve stenosis. This may affect the treatment that you receive. Keep all follow-up visits as told by your health care provider. This is important. Contact a health care provider  if: You have a fever. Get help right away if: You develop any of the following symptoms: Chest pain. Chest tightness. Shortness of breath. Trouble breathing. You feel light-headed. You feel like you might faint. Your heartbeat is irregular or faster than normal. These symptoms may represent a serious problem that is an emergency. Do not wait to see if the symptoms will go away. Get medical help right away. Call your local emergency services (911 in the [place] Do not drive yourself to the hospital. Summary Aortic valve stenosis is a narrowing of the aortic valve in the heart. The aortic valve opens and closes to regulate blood flow between the left side of the heart (left ventricle) and the artery that leads away from the heart (aorta). Aortic valve stenosis can range from mild to severe. If it is not treated, it can become more severe over time  and lead to heart failure. Treatment depends on how severe your condition is and what your symptoms are. You will need to have your heart checked regularly to make sure that your condition is not getting worse or causing serious problems. Exercise regularly and return to your normal activities as told by your health care provider. Ask your health care provider what amount and type of physical activity is safe for you. This information is not intended to replace advice given to you by your health care provider. Make sure you discuss any questions you have with your healthcare provider. Document Revised: 03/07/2020 Document Reviewed: 03/07/2020 Elsevier Patient Education  2022 [employer]

## 2021-05-03 ENCOUNTER — Ambulatory Visit: Payer: Medicare Other | Admitting: Cardiovascular Disease

## 2021-05-31 DIAGNOSIS — E7800 Pure hypercholesterolemia, unspecified: Secondary | ICD-10-CM | POA: Diagnosis not present

## 2021-05-31 DIAGNOSIS — I10 Essential (primary) hypertension: Secondary | ICD-10-CM | POA: Diagnosis not present

## 2021-05-31 DIAGNOSIS — I5030 Unspecified diastolic (congestive) heart failure: Secondary | ICD-10-CM | POA: Diagnosis not present

## 2021-07-09 ENCOUNTER — Ambulatory Visit: Payer: Medicare Other

## 2021-07-09 ENCOUNTER — Other Ambulatory Visit: Payer: Self-pay

## 2021-07-09 DIAGNOSIS — I350 Nonrheumatic aortic (valve) stenosis: Secondary | ICD-10-CM | POA: Diagnosis not present

## 2021-07-18 ENCOUNTER — Ambulatory Visit: Payer: Medicare Other | Admitting: Cardiology

## 2021-07-18 ENCOUNTER — Other Ambulatory Visit: Payer: Self-pay

## 2021-07-18 ENCOUNTER — Encounter: Payer: Self-pay | Admitting: Cardiology

## 2021-07-18 VITALS — BP 144/61 | HR 110 | Temp 98.5°F | Resp 16 | Ht 65.0 in | Wt 157.0 lb

## 2021-07-18 DIAGNOSIS — I10 Essential (primary) hypertension: Secondary | ICD-10-CM | POA: Diagnosis not present

## 2021-07-18 DIAGNOSIS — I350 Nonrheumatic aortic (valve) stenosis: Secondary | ICD-10-CM

## 2021-07-18 NOTE — Progress Notes (Signed)
Patient referred by Lavone Orn, MD for aortic stenosis  Subjective:   Marcus Powers, male    DOB: [birth year], 87 y.o.   MRN: [id number]   Chief Complaint  Patient presents with   Congestive heart failure with preserved left ventricular fu   Hypertension   Follow-up   Results    echo     HPI  87 y.o. African-American male with hypertension, hyperlipidemia, severe AS, mod AI  Patient is here today with his son in law. Patient tells me that he is feeling well. He denies any chest pain, also denies any shortness of breath with the level of activity he does. He tells me that he had swelling in his legs a few weeks ago. He calls this a "clot" in his legs, that improved after urinating. I do not find any objective evidence of him having had any DVT.   I discussed recent echocardiogram results with the patient, details below. He remains unsure about if he wants any procedure for severe aortic stenosis and aortic regurgitation.   Initial consultation HPI: Patient is here today with his son-in-law Marcus Powers.  Patient lives with his wife, daughter, and son-in-law.  He is fairly ambulatory at home with a cane.  She also mows his lawn using a right more.  He does not have overt chest pain, shortness of breath, orthopnea, PND symptoms.  He has only occasional leg edema.  He was in the ER in 02/2021 with wheezing and shortness of breath.  Chest x-ray showed some atelectasis but no obvious edema.  His BNP was over 800.  He was given Lasix for 5 days.  He has not had any recurrence of such symptoms since then.  Reviewed external echocardiogram findings, and personally interpreted them.   Current Outpatient Medications on File Prior to Visit  Medication Sig Dispense Refill   acetaminophen (TYLENOL) 500 MG tablet Take 1,000 mg by mouth every 6 (six) hours as needed for mild pain.     amLODipine (NORVASC) 5 MG tablet Take 5 mg by mouth daily.     losartan (COZAAR) 100 MG tablet Take 100 mg by mouth  daily.     pravastatin (PRAVACHOL) 20 MG tablet Take 20 mg by mouth daily.     No current facility-administered medications on file prior to visit.    Cardiovascular and other pertinent studies:  EKG 04/22/2021: Sinus rhythm 90 bpm Left atrial enlargement.  Left ventricular hypertrophy Diffuse nonspecific T-abnormality, likely due to LVH  Echocardiogram 07/09/2021:  Left ventricle cavity is normal in size. Moderate concentric hypertrophy  of the left ventricle. Normal global wall motion. Normal LV systolic  function with EF 55%. Doppler evidence of grade I (impaired) diastolic  dysfunction, normal LAP.  Left atrial cavity is mildly dilated. Aneurysmal interatrial septum  without 2D or color Doppler evidence of shunting.  Trileaflet aortic valve. Moderate aortic valve leaflet calcification.  Moderate aortic stenosis. Vmax 3.96 m/sec, mean PG 38 mmHg, AVA 0.6 cm by  continuity equation. Dimensionless index 0.17. Moderate (Grade III) aortic  regurgitation. Moderate (Grade II) mitral regurgitation.  Mild tricuspid regurgitation.  No evidence of pulmonary hypertension.   Recent labs: 03/05/2021: Glucose 95, BUN/Cr 16/1.18. EGFR 70.  HbA1C 6.3% Chol 223, TG 163, HDL 76, LDL 119 TSH N/A Trop HS 91, 91 BNP 826   Review of Systems  Cardiovascular:  Negative for chest pain, dyspnea on exertion, leg swelling (Occasional), palpitations and syncope.        Vitals:   07/18/21  1158  BP: (!) 144/61  Pulse: (!) 110  Resp: 16  Temp: 98.5 F (36.9 C)  SpO2: 97%    Body mass index is 26.13 kg/m. Filed Weights   07/18/21 1158  Weight: 157 lb (71.2 kg)     Objective:   Physical Exam Vitals and nursing note reviewed.  Constitutional:      General: He is not in acute distress. Neck:     Vascular: No JVD.  Cardiovascular:     Rate and Rhythm: Normal rate and regular rhythm.     Pulses: Normal pulses.     Heart sounds: Murmur heard.  Harsh midsystolic murmur is present  with a grade of 4/6 at the upper right sternal border radiating to the neck.  Pulmonary:     Effort: Pulmonary effort is normal.     Breath sounds: Normal breath sounds. No wheezing or rales.  Musculoskeletal:     Right lower leg: Edema (Trace) present.     Left lower leg: No edema.           Assessment & Recommendations:   87 y.o. African-American male with hypertension, hyperlipidemia, severe AS, mod AI  Severe AS, mod AI: At present, he is stage C1. He did have symptomatic HFpEF in 02/2021, which has since resolved. Regardless, he remains at increased risk for future recurrent heart failure, arrhythmia, syncope, sudden death. He remains very unsure about if and when he would want to consider TAVR. He seems to think that he should only consider this if he symptomatic. I have explained the risks and benefits of TAVR at this stage. Obviously, he needs coronary angiography and RHC before TAVR. He wants to talk to his daughter and get back to me.  HFpEF: Compensated.  Hypertension: Controlled.  F/u in 4 weeks     Nigel Mormon, MD Pager: [phone] Office: [phone]

## 2021-08-01 DIAGNOSIS — Z0000 Encounter for general adult medical examination without abnormal findings: Secondary | ICD-10-CM | POA: Diagnosis not present

## 2021-08-01 DIAGNOSIS — I350 Nonrheumatic aortic (valve) stenosis: Secondary | ICD-10-CM | POA: Diagnosis not present

## 2021-08-01 DIAGNOSIS — I10 Essential (primary) hypertension: Secondary | ICD-10-CM | POA: Diagnosis not present

## 2021-08-01 DIAGNOSIS — R7301 Impaired fasting glucose: Secondary | ICD-10-CM | POA: Diagnosis not present

## 2021-08-01 DIAGNOSIS — E7800 Pure hypercholesterolemia, unspecified: Secondary | ICD-10-CM | POA: Diagnosis not present

## 2021-08-01 DIAGNOSIS — Z1389 Encounter for screening for other disorder: Secondary | ICD-10-CM | POA: Diagnosis not present

## 2021-08-01 DIAGNOSIS — Z23 Encounter for immunization: Secondary | ICD-10-CM | POA: Diagnosis not present

## 2021-08-15 ENCOUNTER — Ambulatory Visit: Payer: Medicare Other | Admitting: Cardiology

## 2021-08-23 ENCOUNTER — Ambulatory Visit: Payer: Medicare Other | Admitting: Cardiology

## 2021-08-23 ENCOUNTER — Other Ambulatory Visit: Payer: Self-pay

## 2021-12-12 ENCOUNTER — Other Ambulatory Visit: Payer: Self-pay

## 2021-12-12 ENCOUNTER — Emergency Department (HOSPITAL_COMMUNITY): Payer: Medicare Other

## 2021-12-12 ENCOUNTER — Inpatient Hospital Stay (HOSPITAL_COMMUNITY)
Admission: EM | Admit: 2021-12-12 | Discharge: 2021-12-14 | DRG: 286 | Disposition: A | Discharge status: Home / Self Care | Payer: Medicare Other | Attending: Internal Medicine | Admitting: Internal Medicine

## 2021-12-12 ENCOUNTER — Encounter (HOSPITAL_COMMUNITY): Payer: Self-pay | Admitting: Emergency Medicine

## 2021-12-12 DIAGNOSIS — I499 Cardiac arrhythmia, unspecified: Secondary | ICD-10-CM | POA: Diagnosis not present

## 2021-12-12 DIAGNOSIS — Z87891 Personal history of nicotine dependence: Secondary | ICD-10-CM

## 2021-12-12 DIAGNOSIS — J9811 Atelectasis: Secondary | ICD-10-CM | POA: Diagnosis not present

## 2021-12-12 DIAGNOSIS — Z20822 Contact with and (suspected) exposure to covid-19: Secondary | ICD-10-CM | POA: Diagnosis present

## 2021-12-12 DIAGNOSIS — I5031 Acute diastolic (congestive) heart failure: Secondary | ICD-10-CM | POA: Diagnosis not present

## 2021-12-12 DIAGNOSIS — E876 Hypokalemia: Secondary | ICD-10-CM | POA: Diagnosis present

## 2021-12-12 DIAGNOSIS — I110 Hypertensive heart disease with heart failure: Principal | ICD-10-CM | POA: Diagnosis present

## 2021-12-12 DIAGNOSIS — R0602 Shortness of breath: Secondary | ICD-10-CM | POA: Diagnosis not present

## 2021-12-12 DIAGNOSIS — Z888 Allergy status to other drugs, medicaments and biological substances status: Secondary | ICD-10-CM | POA: Diagnosis not present

## 2021-12-12 DIAGNOSIS — I350 Nonrheumatic aortic (valve) stenosis: Secondary | ICD-10-CM

## 2021-12-12 DIAGNOSIS — I5033 Acute on chronic diastolic (congestive) heart failure: Secondary | ICD-10-CM | POA: Diagnosis present

## 2021-12-12 DIAGNOSIS — J9601 Acute respiratory failure with hypoxia: Secondary | ICD-10-CM | POA: Diagnosis present

## 2021-12-12 DIAGNOSIS — I429 Cardiomyopathy, unspecified: Secondary | ICD-10-CM | POA: Diagnosis not present

## 2021-12-12 DIAGNOSIS — Z79899 Other long term (current) drug therapy: Secondary | ICD-10-CM

## 2021-12-12 DIAGNOSIS — R0689 Other abnormalities of breathing: Secondary | ICD-10-CM | POA: Diagnosis not present

## 2021-12-12 DIAGNOSIS — R0603 Acute respiratory distress: Secondary | ICD-10-CM | POA: Diagnosis present

## 2021-12-12 DIAGNOSIS — R6889 Other general symptoms and signs: Secondary | ICD-10-CM | POA: Diagnosis not present

## 2021-12-12 DIAGNOSIS — E785 Hyperlipidemia, unspecified: Secondary | ICD-10-CM | POA: Diagnosis not present

## 2021-12-12 DIAGNOSIS — Z743 Need for continuous supervision: Secondary | ICD-10-CM | POA: Diagnosis not present

## 2021-12-12 DIAGNOSIS — J90 Pleural effusion, not elsewhere classified: Secondary | ICD-10-CM | POA: Diagnosis not present

## 2021-12-12 DIAGNOSIS — I5032 Chronic diastolic (congestive) heart failure: Secondary | ICD-10-CM | POA: Diagnosis present

## 2021-12-12 DIAGNOSIS — R000 Tachycardia, unspecified: Secondary | ICD-10-CM | POA: Diagnosis not present

## 2021-12-12 DIAGNOSIS — I10 Essential (primary) hypertension: Secondary | ICD-10-CM | POA: Diagnosis present

## 2021-12-12 HISTORY — DX: Chronic diastolic (congestive) heart failure: I50.32

## 2021-12-12 HISTORY — DX: Nonrheumatic aortic (valve) stenosis: I35.0

## 2021-12-12 LAB — CBC WITH DIFFERENTIAL/PLATELET
Abs Immature Granulocytes: 0.02 10*3/uL (ref 0.00–0.07)
Basophils Absolute: 0.1 10*3/uL (ref 0.0–0.1)
Basophils Relative: 1 %
Eosinophils Absolute: 0.1 10*3/uL (ref 0.0–0.5)
Eosinophils Relative: 1 %
HCT: 35.1 % — ABNORMAL LOW (ref 39.0–52.0)
Hemoglobin: 11.4 g/dL — ABNORMAL LOW (ref 13.0–17.0)
Immature Granulocytes: 0 %
Lymphocytes Relative: 21 %
Lymphs Abs: 1.6 10*3/uL (ref 0.7–4.0)
MCH: 32.6 pg (ref 26.0–34.0)
MCHC: 32.5 g/dL (ref 30.0–36.0)
MCV: 100.3 fL — ABNORMAL HIGH (ref 80.0–100.0)
Monocytes Absolute: 0.5 10*3/uL (ref 0.1–1.0)
Monocytes Relative: 7 %
Neutro Abs: 5.2 10*3/uL (ref 1.7–7.7)
Neutrophils Relative %: 70 %
Platelets: 197 10*3/uL (ref 150–400)
RBC: 3.5 MIL/uL — ABNORMAL LOW (ref 4.22–5.81)
RDW: 13.7 % (ref 11.5–15.5)
WBC: 7.5 10*3/uL (ref 4.0–10.5)
nRBC: 0 % (ref 0.0–0.2)

## 2021-12-12 NOTE — ED Provider Notes (Signed)
[facility] EMERGENCY DEPARTMENT Provider Note   CSN: [id number] Arrival date & time: 12/12/21  2332     History  Chief Complaint  Patient presents with   Shortness of Breath    Marcus Powers is a 88 y.o. male.  Patient presents to the emergency department for evaluation of shortness of breath.  Patient reports that he got up to go to the bathroom and noticed that he was very short of breath.  EMS reports that he was in distress upon their arrival.  Oxygen saturation was 85% on room air, he was diaphoretic and tripoding.  Patient placed on CPAP and has significantly improved.  Respiratory rate decreased from the 40s to 20s and his oxygen saturation is now 100%.  Patient reports that he feels much improved.  He never had any associated chest pain.  Patient has noticed swelling of his legs which is unusual for him.  He does not take a diuretic.   Shortness of Breath     Home Medications Prior to Admission medications   Medication Sig Start Date End Date Taking? Authorizing Provider  acetaminophen (TYLENOL) 500 MG tablet Take 1,000 mg by mouth every 6 (six) hours as needed for mild pain.    [provider]  amLODipine (NORVASC) 5 MG tablet Take 5 mg by mouth daily. 04/16/21   [provider]  losartan (COZAAR) 100 MG tablet Take 100 mg by mouth daily. 12/31/20   [provider]  pravastatin (PRAVACHOL) 20 MG tablet Take 20 mg by mouth daily. 12/31/20   [provider]      Allergies    Benazepril hcl    Review of Systems   Review of Systems  Respiratory:  Positive for shortness of breath.   Cardiovascular:  Positive for leg swelling.   Physical Exam Updated Vital Signs BP (!) 157/100 (BP Location: Right Arm)    Pulse 83    Temp 97.9 F (36.6 C) (Oral)    Resp 19    Ht 5\' 5"  (1.651 m)    Wt 70.2 kg    SpO2 98%    BMI 25.74 kg/m  Physical Exam Vitals and nursing note reviewed.  Constitutional:      General: He is not in  acute distress.    Appearance: He is well-developed.  HENT:     Head: Normocephalic and atraumatic.     Mouth/Throat:     Mouth: Mucous membranes are moist.  Eyes:     General: Vision grossly intact. Gaze aligned appropriately.     Extraocular Movements: Extraocular movements intact.     Conjunctiva/sclera: Conjunctivae normal.  Cardiovascular:     Rate and Rhythm: Normal rate and regular rhythm.     Pulses: Normal pulses.     Heart sounds: Normal heart sounds, S1 normal and S2 normal. No murmur heard.   No friction rub. No gallop.  Pulmonary:     Effort: Accessory muscle usage present.     Breath sounds: Examination of the right-upper field reveals wheezing and rales. Examination of the left-upper field reveals wheezing and rales. Examination of the right-middle field reveals rales. Examination of the left-middle field reveals rales. Examination of the right-lower field reveals rales. Examination of the left-lower field reveals rales. Wheezing and rales present.  Abdominal:     Palpations: Abdomen is soft.     Tenderness: There is no abdominal tenderness. There is no guarding or rebound.     Hernia: No hernia is present.  Musculoskeletal:  General: No swelling.     Cervical back: Full passive range of motion without pain, normal range of motion and neck supple. No pain with movement, spinous process tenderness or muscular tenderness. Normal range of motion.     Right lower leg: Edema present.     Left lower leg: Edema present.  Skin:    General: Skin is warm and dry.     Capillary Refill: Capillary refill takes less than 2 seconds.     Findings: No ecchymosis, erythema, lesion or wound.  Neurological:     Mental Status: He is alert and oriented to person, place, and time.     GCS: GCS eye subscore is 4. GCS verbal subscore is 5. GCS motor subscore is 6.     Cranial Nerves: Cranial nerves 2-12 are intact.     Sensory: Sensation is intact.     Motor: Motor function is intact.  No weakness or abnormal muscle tone.     Coordination: Coordination is intact.  Psychiatric:        Mood and Affect: Mood normal.        Speech: Speech normal.        Behavior: Behavior normal.    ED Results / Procedures / Treatments   Labs (all labs ordered are listed, but only abnormal results are displayed) Labs Reviewed  CBC WITH DIFFERENTIAL/PLATELET - Abnormal; Notable for the following components:      Result Value   RBC 3.50 (*)    Hemoglobin 11.4 (*)    HCT 35.1 (*)    MCV 100.3 (*)    All other components within normal limits  COMPREHENSIVE METABOLIC PANEL - Abnormal; Notable for the following components:   Potassium 3.3 (*)    Glucose, Bld 189 (*)    Calcium 8.7 (*)    Total Protein 6.1 (*)    Total Bilirubin 1.4 (*)    GFR, Estimated 57 (*)    All other components within normal limits  BRAIN NATRIURETIC PEPTIDE - Abnormal; Notable for the following components:   B Natriuretic Peptide 960.3 (*)    All other components within normal limits  COMPREHENSIVE METABOLIC PANEL - Abnormal; Notable for the following components:   Potassium 3.3 (*)    Glucose, Bld 125 (*)    Total Bilirubin 1.3 (*)    All other components within normal limits  CBC WITH DIFFERENTIAL/PLATELET - Abnormal; Notable for the following components:   RBC 3.54 (*)    Hemoglobin 11.2 (*)    HCT 34.5 (*)    All other components within normal limits  TROPONIN I (HIGH SENSITIVITY) - Abnormal; Notable for the following components:   Troponin I (High Sensitivity) 95 (*)    All other components within normal limits  TROPONIN I (HIGH SENSITIVITY) - Abnormal; Notable for the following components:   Troponin I (High Sensitivity) 102 (*)    All other components within normal limits  RESP PANEL BY RT-PCR (FLU A&B, COVID) ARPGX2  MAGNESIUM  PHOSPHORUS  TROPONIN I (HIGH SENSITIVITY)    EKG EKG Interpretation  Date/Time:  Thursday December 12 2021 23:37:57 EST Ventricular Rate:  103 PR  Interval:  168 QRS Duration: 89 QT Interval:  373 QTC Calculation: 489 R Axis:   55 Text Interpretation: Sinus tachycardia Supraventricular bigeminy Left atrial enlargement Probable LVH with secondary repol abnrm Borderline prolonged QT interval No significant change since last tracing Confirmed by Gilda Crease [phone]) on 12/12/2021 11:42:28 PM  Radiology CT Angio Chest Pulmonary Embolism (PE)  W or WO Contrast  Result Date: 12/13/2021 CLINICAL DATA:  Shortness of breath EXAM: CT ANGIOGRAPHY CHEST WITH CONTRAST TECHNIQUE: Multidetector CT imaging of the chest was performed using the standard protocol during bolus administration of intravenous contrast. Multiplanar CT image reconstructions and MIPs were obtained to evaluate the vascular anatomy. RADIATION DOSE REDUCTION: This exam was performed according to the departmental dose-optimization program which includes automated exposure control, adjustment of the mA and/or kV according to patient size and/or use of iterative reconstruction technique. CONTRAST:  34mL OMNIPAQUE IOHEXOL 350 MG/ML SOLN COMPARISON:  Chest x-ray from earlier in the same day. FINDINGS: Cardiovascular: Atherosclerotic calcifications of the thoracic aorta and its branches are noted. No aneurysmal dilatation is seen. No cardiac enlargement is noted. Coronary calcifications are noted. The pulmonary artery shows a normal branching pattern bilaterally. No focal filling defect to suggest pulmonary embolism is seen. Mediastinum/Nodes: Thoracic inlet is within normal limits. No sizable hilar or mediastinal adenopathy is noted. The esophagus is within normal limits. Lungs/Pleura: Lungs demonstrate bilateral small pleural effusions. Associated basilar atelectasis is seen. Small nodule is noted in the right upper lobe laterally measuring less than 5 mm best seen on image number 25 of series 6. no other nodules are seen. Upper Abdomen: No acute abnormality is noted. Right renal cyst is seen  measuring up to 5.6 cm. Musculoskeletal: Degenerative changes of the thoracic spine are noted. No rib abnormality is seen. Review of the MIP images confirms the above findings. IMPRESSION: No evidence of pulmonary emboli. 4 mm right solid pulmonary nodule within the upper lobe. No follow-up imaging is recommended. Reference: Radiology. 2017; 284(1):228-43. Bibasilar atelectasis is noted. Aortic Atherosclerosis (ICD10-I70.0). Electronically Signed   By: Alcide Clever M.D.   On: 12/13/2021 02:36   DG Chest Port 1 View  Result Date: 12/13/2021 CLINICAL DATA:  Shortness of breath. EXAM: PORTABLE CHEST 1 VIEW COMPARISON:  Chest x-ray 05/13/2017. FINDINGS: The heart size and mediastinal contours are within normal limits. There is minimal left basilar atelectasis. The lungs are otherwise clear. No pleural effusion or pneumothorax. The visualized skeletal structures are unremarkable. IMPRESSION: Minimal left basilar atelectasis. Electronically Signed   By: Darliss Cheney M.D.   On: 12/13/2021 00:00    Procedures Procedures    Medications Ordered in ED Medications  acetaminophen (TYLENOL) tablet 650 mg (has no administration in time range)    Or  acetaminophen (TYLENOL) suppository 650 mg (has no administration in time range)  furosemide (LASIX) injection 20 mg (has no administration in time range)  pravastatin (PRAVACHOL) tablet 20 mg (has no administration in time range)  iohexol (OMNIPAQUE) 350 MG/ML injection 73 mL (73 mLs Intravenous Contrast Given 12/13/21 0223)  furosemide (LASIX) injection 40 mg (40 mg Intravenous Given 12/13/21 0302)  potassium chloride SA (KLOR-CON M) CR tablet 40 mEq (40 mEq Oral Given 12/13/21 0532)    ED Course/ Medical Decision Making/ A&P                           Medical Decision Making Amount and/or Complexity of Data Reviewed Independent Historian: EMS External Data Reviewed: radiology, ECG and notes. Labs: ordered. Decision-making details documented in ED  Course. Radiology: ordered and independent interpretation performed. Decision-making details documented in ED Course. ECG/medicine tests: ordered and independent interpretation performed. Decision-making details documented in ED Course.  Risk Prescription drug management. Decision regarding hospitalization.   Patient presents to the emergency department with respiratory distress.  Patient reports that he awoke from  sleep and felt very short of breath.  He has been noting increased swelling of his legs over the last few days.  Differential diagnosis considered: Acute coronary syndrome, hypertensive urgency, congestive heart failure exacerbation, pneumonia, bronchospasm  Upon arrival, patient is on CPAP.  He was continued on BiPAP.  Patient with diffuse rales and slight wheezing evident on upon arrival.  Chest x-ray at arrival did not show significant pathology, however.  Patient was hypertensive for EMS but this improved with sublingual nitroglycerin x2.  Continued on BiPAP but was able to be weaned off.  Patient tachycardic and hypoxic without clear cause at arrival.  CT angiography performed.  No evidence of PE but he does have bilateral pleural effusions.  Does have slightly reduced ejection fraction and diastolic heart failure on most recent echo.  BNP elevated.  Presentation most consistent with pulmonary edema and congestive heart failure exacerbation, likely precipitated by elevated blood pressure.  Will hospitalize for further management.        Final Clinical Impression(s) / ED Diagnoses Final diagnoses:  Acute respiratory failure with hypoxia [facility])    Rx / DC Orders ED Discharge Orders     None         Delquan Poucher, Canary Brim, MD 12/13/21 [phone]

## 2021-12-12 NOTE — Progress Notes (Signed)
Placed pt on bipap PS10, peep 5 40%. Pt tolerating well. Pt resp rate 22. Small right sided wheeze. Pt not in distress. Will continue to monitor.  ?

## 2021-12-12 NOTE — ED Triage Notes (Signed)
Pt BIB [facility] from home, pt c/o sudden onset shortness of breath after getting up to use the bathroom. On EMS arrival, pt tripoding, diaphoretic and +rales. Given 2NTG by EMS, and placed on CPAP pta. ?

## 2021-12-13 ENCOUNTER — Encounter (HOSPITAL_COMMUNITY)
Admission: EM | Disposition: A | Discharge status: Home / Self Care | Payer: Self-pay | Source: Home / Self Care | Attending: Internal Medicine

## 2021-12-13 ENCOUNTER — Emergency Department (HOSPITAL_COMMUNITY): Payer: Medicare Other

## 2021-12-13 ENCOUNTER — Observation Stay (HOSPITAL_COMMUNITY): Payer: Medicare Other

## 2021-12-13 ENCOUNTER — Encounter (HOSPITAL_COMMUNITY): Payer: Self-pay | Admitting: Internal Medicine

## 2021-12-13 DIAGNOSIS — I5032 Chronic diastolic (congestive) heart failure: Secondary | ICD-10-CM | POA: Diagnosis present

## 2021-12-13 DIAGNOSIS — E876 Hypokalemia: Secondary | ICD-10-CM | POA: Diagnosis present

## 2021-12-13 DIAGNOSIS — I350 Nonrheumatic aortic (valve) stenosis: Secondary | ICD-10-CM

## 2021-12-13 DIAGNOSIS — Z20822 Contact with and (suspected) exposure to covid-19: Secondary | ICD-10-CM | POA: Diagnosis present

## 2021-12-13 DIAGNOSIS — Z888 Allergy status to other drugs, medicaments and biological substances status: Secondary | ICD-10-CM | POA: Diagnosis not present

## 2021-12-13 DIAGNOSIS — J90 Pleural effusion, not elsewhere classified: Secondary | ICD-10-CM | POA: Diagnosis not present

## 2021-12-13 DIAGNOSIS — E785 Hyperlipidemia, unspecified: Secondary | ICD-10-CM | POA: Diagnosis not present

## 2021-12-13 DIAGNOSIS — Z79899 Other long term (current) drug therapy: Secondary | ICD-10-CM | POA: Diagnosis not present

## 2021-12-13 DIAGNOSIS — I5033 Acute on chronic diastolic (congestive) heart failure: Secondary | ICD-10-CM | POA: Diagnosis not present

## 2021-12-13 DIAGNOSIS — Z87891 Personal history of nicotine dependence: Secondary | ICD-10-CM | POA: Diagnosis not present

## 2021-12-13 DIAGNOSIS — I5031 Acute diastolic (congestive) heart failure: Secondary | ICD-10-CM | POA: Diagnosis not present

## 2021-12-13 DIAGNOSIS — R0602 Shortness of breath: Secondary | ICD-10-CM | POA: Diagnosis not present

## 2021-12-13 DIAGNOSIS — J9811 Atelectasis: Secondary | ICD-10-CM | POA: Diagnosis not present

## 2021-12-13 DIAGNOSIS — I110 Hypertensive heart disease with heart failure: Secondary | ICD-10-CM | POA: Diagnosis present

## 2021-12-13 DIAGNOSIS — I10 Essential (primary) hypertension: Secondary | ICD-10-CM

## 2021-12-13 DIAGNOSIS — J9601 Acute respiratory failure with hypoxia: Principal | ICD-10-CM | POA: Diagnosis present

## 2021-12-13 DIAGNOSIS — R0603 Acute respiratory distress: Secondary | ICD-10-CM | POA: Diagnosis present

## 2021-12-13 DIAGNOSIS — I429 Cardiomyopathy, unspecified: Secondary | ICD-10-CM | POA: Diagnosis present

## 2021-12-13 HISTORY — PX: RIGHT HEART CATH AND CORONARY ANGIOGRAPHY: CATH118264

## 2021-12-13 LAB — ECHOCARDIOGRAM COMPLETE
AR max vel: 0.33 cm2
AV Area VTI: 0.39 cm2
AV Area mean vel: 0.31 cm2
AV Mean grad: 35 mmHg
AV Peak grad: 56 mmHg
Ao pk vel: 3.74 m/s
Area-P 1/2: 2.66 cm2
Calc EF: 52.9 %
Height: 65 in
MV M vel: 5.8 m/s
MV Peak grad: 134.6 mmHg
P 1/2 time: 315 msec
S' Lateral: 3.25 cm
Single Plane A2C EF: 48.2 %
Single Plane A4C EF: 59.7 %
Weight: 2475.2 oz

## 2021-12-13 LAB — COMPREHENSIVE METABOLIC PANEL
ALT: 10 U/L (ref 0–44)
ALT: 13 U/L (ref 0–44)
AST: 19 U/L (ref 15–41)
AST: 22 U/L (ref 15–41)
Albumin: 3.6 g/dL (ref 3.5–5.0)
Albumin: 3.7 g/dL (ref 3.5–5.0)
Alkaline Phosphatase: 53 U/L (ref 38–126)
Alkaline Phosphatase: 55 U/L (ref 38–126)
Anion gap: 10 (ref 5–15)
Anion gap: 11 (ref 5–15)
BUN: 14 mg/dL (ref 8–23)
BUN: 16 mg/dL (ref 8–23)
CO2: 22 mmol/L (ref 22–32)
CO2: 24 mmol/L (ref 22–32)
Calcium: 8.7 mg/dL — ABNORMAL LOW (ref 8.9–10.3)
Calcium: 9 mg/dL (ref 8.9–10.3)
Chloride: 103 mmol/L (ref 98–111)
Chloride: 106 mmol/L (ref 98–111)
Creatinine, Ser: 1.14 mg/dL (ref 0.61–1.24)
Creatinine, Ser: 1.2 mg/dL (ref 0.61–1.24)
GFR, Estimated: 57 mL/min — ABNORMAL LOW (ref >60)
GFR, Estimated: >60 mL/min (ref >60)
Glucose, Bld: 125 mg/dL — ABNORMAL HIGH (ref 70–99)
Glucose, Bld: 189 mg/dL — ABNORMAL HIGH (ref 70–99)
Potassium: 3.3 mmol/L — ABNORMAL LOW (ref 3.5–5.1)
Potassium: 3.3 mmol/L — ABNORMAL LOW (ref 3.5–5.1)
Sodium: 138 mmol/L (ref 135–145)
Sodium: 138 mmol/L (ref 135–145)
Total Bilirubin: 1.3 mg/dL — ABNORMAL HIGH (ref 0.3–1.2)
Total Bilirubin: 1.4 mg/dL — ABNORMAL HIGH (ref 0.3–1.2)
Total Protein: 6.1 g/dL — ABNORMAL LOW (ref 6.5–8.1)
Total Protein: 6.6 g/dL (ref 6.5–8.1)

## 2021-12-13 LAB — BRAIN NATRIURETIC PEPTIDE: B Natriuretic Peptide: 960.3 pg/mL — ABNORMAL HIGH (ref 0.0–100.0)

## 2021-12-13 LAB — POCT I-STAT 7, (LYTES, BLD GAS, ICA,H+H)
Acid-Base Excess: 2 mmol/L (ref 0.0–2.0)
Bicarbonate: 26 mmol/L (ref 20.0–28.0)
Calcium, Ion: 1.21 mmol/L (ref 1.15–1.40)
HCT: 32 % — ABNORMAL LOW (ref 39.0–52.0)
Hemoglobin: 10.9 g/dL — ABNORMAL LOW (ref 13.0–17.0)
O2 Saturation: 99 %
Potassium: 3.5 mmol/L (ref 3.5–5.1)
Sodium: 142 mmol/L (ref 135–145)
TCO2: 27 mmol/L (ref 22–32)
pCO2 arterial: 39 mmHg (ref 32–48)
pH, Arterial: 7.432 (ref 7.35–7.45)
pO2, Arterial: 123 mmHg — ABNORMAL HIGH (ref 83–108)

## 2021-12-13 LAB — CBC WITH DIFFERENTIAL/PLATELET
Abs Immature Granulocytes: 0.03 10*3/uL (ref 0.00–0.07)
Basophils Absolute: 0.1 10*3/uL (ref 0.0–0.1)
Basophils Relative: 1 %
Eosinophils Absolute: 0 10*3/uL (ref 0.0–0.5)
Eosinophils Relative: 0 %
HCT: 34.5 % — ABNORMAL LOW (ref 39.0–52.0)
Hemoglobin: 11.2 g/dL — ABNORMAL LOW (ref 13.0–17.0)
Immature Granulocytes: 0 %
Lymphocytes Relative: 12 %
Lymphs Abs: 1.1 10*3/uL (ref 0.7–4.0)
MCH: 31.6 pg (ref 26.0–34.0)
MCHC: 32.5 g/dL (ref 30.0–36.0)
MCV: 97.5 fL (ref 80.0–100.0)
Monocytes Absolute: 0.9 10*3/uL (ref 0.1–1.0)
Monocytes Relative: 10 %
Neutro Abs: 7 10*3/uL (ref 1.7–7.7)
Neutrophils Relative %: 77 %
Platelets: 195 10*3/uL (ref 150–400)
RBC: 3.54 MIL/uL — ABNORMAL LOW (ref 4.22–5.81)
RDW: 13.7 % (ref 11.5–15.5)
WBC: 9.1 10*3/uL (ref 4.0–10.5)
nRBC: 0 % (ref 0.0–0.2)

## 2021-12-13 LAB — MAGNESIUM: Magnesium: 2 mg/dL (ref 1.7–2.4)

## 2021-12-13 LAB — TROPONIN I (HIGH SENSITIVITY)
Troponin I (High Sensitivity): 102 ng/L (ref <18)
Troponin I (High Sensitivity): 105 ng/L (ref <18)
Troponin I (High Sensitivity): 95 ng/L — ABNORMAL HIGH (ref <18)

## 2021-12-13 LAB — POCT I-STAT EG7
Acid-Base Excess: 2 mmol/L (ref 0.0–2.0)
Bicarbonate: 27.2 mmol/L (ref 20.0–28.0)
Calcium, Ion: 1.23 mmol/L (ref 1.15–1.40)
HCT: 32 % — ABNORMAL LOW (ref 39.0–52.0)
Hemoglobin: 10.9 g/dL — ABNORMAL LOW (ref 13.0–17.0)
O2 Saturation: 68 %
Potassium: 3.6 mmol/L (ref 3.5–5.1)
Sodium: 143 mmol/L (ref 135–145)
TCO2: 29 mmol/L (ref 22–32)
pCO2, Ven: 43.4 mmHg — ABNORMAL LOW (ref 44–60)
pH, Ven: 7.405 (ref 7.25–7.43)
pO2, Ven: 36 mmHg (ref 32–45)

## 2021-12-13 LAB — RESP PANEL BY RT-PCR (FLU A&B, COVID) ARPGX2
Influenza A by PCR: NEGATIVE
Influenza B by PCR: NEGATIVE
SARS Coronavirus 2 by RT PCR: NEGATIVE

## 2021-12-13 LAB — PHOSPHORUS: Phosphorus: 3.1 mg/dL (ref 2.5–4.6)

## 2021-12-13 SURGERY — RIGHT HEART CATH AND CORONARY ANGIOGRAPHY
Anesthesia: LOCAL

## 2021-12-13 MED ORDER — ACETAMINOPHEN 325 MG PO TABS: 650.0000 mg | ORAL_TABLET | Freq: Four times a day (QID) | ORAL | Status: DC | PRN

## 2021-12-13 MED ORDER — HEPARIN (PORCINE) IN NACL 1000-0.9 UT/500ML-% IV SOLN
INTRAVENOUS | Status: AC
  Filled 2021-12-13: qty 1000

## 2021-12-13 MED ORDER — SODIUM CHLORIDE 0.9 % IV SOLN: 250.0000 mL | INTRAVENOUS | Status: DC | PRN

## 2021-12-13 MED ORDER — VERAPAMIL HCL 2.5 MG/ML IV SOLN
INTRAVENOUS | Status: DC | PRN
  Administered 2021-12-13: 10 mL via INTRA_ARTERIAL

## 2021-12-13 MED ORDER — MIDAZOLAM HCL 2 MG/2ML IJ SOLN
INTRAMUSCULAR | Status: AC
  Filled 2021-12-13: qty 2

## 2021-12-13 MED ORDER — POTASSIUM CHLORIDE CRYS ER 20 MEQ PO TBCR
40.0000 meq | EXTENDED_RELEASE_TABLET | Freq: Once | ORAL | Status: AC
  Administered 2021-12-13: 40 meq via ORAL
  Filled 2021-12-13: qty 2

## 2021-12-13 MED ORDER — LIDOCAINE HCL (PF) 1 % IJ SOLN
INTRAMUSCULAR | Status: AC
Start: 2021-12-13 — End: ?
  Filled 2021-12-13: qty 30

## 2021-12-13 MED ORDER — ONDANSETRON HCL 4 MG/2ML IJ SOLN: 4.0000 mg | Freq: Four times a day (QID) | INTRAMUSCULAR | Status: DC | PRN

## 2021-12-13 MED ORDER — FENTANYL CITRATE (PF) 100 MCG/2ML IJ SOLN
INTRAMUSCULAR | Status: DC | PRN
  Administered 2021-12-13: 25 ug via INTRAVENOUS

## 2021-12-13 MED ORDER — SODIUM CHLORIDE 0.9 % IV SOLN: INTRAVENOUS | Status: DC

## 2021-12-13 MED ORDER — FUROSEMIDE 10 MG/ML IJ SOLN
20.0000 mg | Freq: Two times a day (BID) | INTRAMUSCULAR | Status: DC
Start: 2021-12-13 — End: 2021-12-14
  Administered 2021-12-13 (×2): 20 mg via INTRAVENOUS
  Filled 2021-12-13 (×2): qty 2

## 2021-12-13 MED ORDER — FENTANYL CITRATE (PF) 100 MCG/2ML IJ SOLN
INTRAMUSCULAR | Status: AC
  Filled 2021-12-13: qty 2

## 2021-12-13 MED ORDER — HEPARIN SODIUM (PORCINE) 1000 UNIT/ML IJ SOLN
INTRAMUSCULAR | Status: DC | PRN
  Administered 2021-12-13 (×2): 2000 [IU] via INTRAVENOUS

## 2021-12-13 MED ORDER — MIDAZOLAM HCL 2 MG/2ML IJ SOLN
INTRAMUSCULAR | Status: DC | PRN
  Administered 2021-12-13: 1 mg via INTRAVENOUS

## 2021-12-13 MED ORDER — SODIUM CHLORIDE 0.9% FLUSH: 3.0000 mL | Freq: Two times a day (BID) | INTRAVENOUS | Status: DC

## 2021-12-13 MED ORDER — ACETAMINOPHEN 650 MG RE SUPP: 650.0000 mg | Freq: Four times a day (QID) | RECTAL | Status: DC | PRN

## 2021-12-13 MED ORDER — LABETALOL HCL 5 MG/ML IV SOLN: 10.0000 mg | INTRAVENOUS | Status: AC | PRN

## 2021-12-13 MED ORDER — VERAPAMIL HCL 2.5 MG/ML IV SOLN
INTRAVENOUS | Status: AC
  Filled 2021-12-13: qty 2

## 2021-12-13 MED ORDER — HEPARIN SODIUM (PORCINE) 1000 UNIT/ML IJ SOLN
INTRAMUSCULAR | Status: AC
  Filled 2021-12-13: qty 10

## 2021-12-13 MED ORDER — ASPIRIN 81 MG PO CHEW: 81.0000 mg | CHEWABLE_TABLET | ORAL | Status: DC

## 2021-12-13 MED ORDER — PRAVASTATIN SODIUM 10 MG PO TABS
20.0000 mg | ORAL_TABLET | Freq: Every day | ORAL | Status: DC
  Administered 2021-12-14: 20 mg via ORAL
  Filled 2021-12-13 (×2): qty 2

## 2021-12-13 MED ORDER — IOHEXOL 350 MG/ML SOLN
INTRAVENOUS | Status: DC | PRN
  Administered 2021-12-13: 30 mL

## 2021-12-13 MED ORDER — HYDRALAZINE HCL 20 MG/ML IJ SOLN: 10.0000 mg | INTRAMUSCULAR | Status: AC | PRN

## 2021-12-13 MED ORDER — LIDOCAINE HCL (PF) 1 % IJ SOLN
INTRAMUSCULAR | Status: DC | PRN
  Administered 2021-12-13 (×2): 2 mL

## 2021-12-13 MED ORDER — FUROSEMIDE 10 MG/ML IJ SOLN
40.0000 mg | Freq: Once | INTRAMUSCULAR | Status: AC
  Administered 2021-12-13: 40 mg via INTRAVENOUS
  Filled 2021-12-13: qty 4

## 2021-12-13 MED ORDER — IOHEXOL 350 MG/ML SOLN
73.0000 mL | Freq: Once | INTRAVENOUS | Status: AC | PRN
  Administered 2021-12-13: 73 mL via INTRAVENOUS

## 2021-12-13 MED ORDER — SODIUM CHLORIDE 0.9% FLUSH: 3.0000 mL | INTRAVENOUS | Status: DC | PRN

## 2021-12-13 MED ORDER — HEPARIN (PORCINE) IN NACL 1000-0.9 UT/500ML-% IV SOLN
INTRAVENOUS | Status: DC | PRN
  Administered 2021-12-13 (×2): 500 mL

## 2021-12-13 MED ORDER — SODIUM CHLORIDE 0.9% FLUSH
3.0000 mL | Freq: Two times a day (BID) | INTRAVENOUS | Status: DC
  Administered 2021-12-13 – 2021-12-14 (×3): 3 mL via INTRAVENOUS

## 2021-12-13 MED ORDER — ACETAMINOPHEN 325 MG PO TABS: 650.0000 mg | ORAL_TABLET | ORAL | Status: DC | PRN

## 2021-12-13 SURGICAL SUPPLY — 17 items
CATH BALLN WEDGE 5F 110CM (CATHETERS) ×1 IMPLANT
CATH INFINITI 5 FR JL3.5 (CATHETERS) ×1 IMPLANT
CATH INFINITI 5FR MULTPACK ANG (CATHETERS) ×1 IMPLANT
CATH SWAN GANZ 7F STRAIGHT (CATHETERS) IMPLANT
DEVICE RAD COMP TR BAND LRG (VASCULAR PRODUCTS) ×1 IMPLANT
GLIDESHEATH SLEND A-KIT 6F 22G (SHEATH) ×1 IMPLANT
GUIDEWIRE INQWIRE 1.5J.035X260 (WIRE) IMPLANT
INQWIRE 1.5J .035X260CM (WIRE) ×2
KIT HEART LEFT (KITS) ×6 IMPLANT
KIT MICROPUNCTURE NIT STIFF (SHEATH) ×1 IMPLANT
PACK CARDIAC CATHETERIZATION (CUSTOM PROCEDURE TRAY) ×6 IMPLANT
SHEATH GLIDE SLENDER 4/5FR (SHEATH) ×1 IMPLANT
SHEATH PINNACLE 5F 10CM (SHEATH) IMPLANT
SHEATH PINNACLE 7F 10CM (SHEATH) IMPLANT
SYR MEDRAD MARK 7 150ML (SYRINGE) ×6 IMPLANT
TRANSDUCER W/STOPCOCK (MISCELLANEOUS) ×6 IMPLANT
TUBING CIL FLEX 10 FLL-RA (TUBING) ×6 IMPLANT

## 2021-12-13 NOTE — H&P (View-Only) (Signed)
CARDIOLOGY CONSULT NOTE  ?Patient ID: ?Marcus Powers ?MRN: [id number] ?DOB/AGE: 07-Dec-1927 88 y.o. ? ?Admit date: 12/12/2021 ?Referring Physician: [facility] hospitalist ?Reason for Consultation:  Severe AS, heart failure ? ?HPI:  ? ?88 y.o. African-American male with hypertension, hyperlipidemia, severe AS, mod AI, now admitted with acute decompensated heart failure ? ?Patient had previously been reluctant to consider TAVR workup as he had been only sporadically symptomatic. However, last two days, he has had severe shortness of breath with minimal activity, generalized weakness. He has also noticed swelling in both his feet. No chest pain. Workup shows elevated BNP, elevated but flat trop. He is currently asymptomatic at rest.  ? ?Past Medical History:  ?Diagnosis Date  ? Aortic stenosis   ? Chronic diastolic heart failure ([place])   ? Hyperlipidemia   ? Hypertension   ? Spinal stenosis   ?  ? ?Past Surgical History:  ?Procedure Laterality Date  ? BIOPSY BREAST    ?  ? ?\ ?Family History  ?Problem Relation Age of Onset  ? Cancer Mother   ? Stroke Father   ?  ? ?Social History: ?Social History  ? ?Socioeconomic History  ? Marital status: Married  ?  Spouse name: Not on file  ? Number of children: Not on file  ? Years of education: Not on file  ? Highest education level: Not on file  ?Occupational History  ? Not on file  ?Tobacco Use  ? Smoking status: Former  ?  Packs/day: 0.25  ?  Years: 5.00  ?  Pack years: 1.25  ?  Types: Cigarettes  ?  Quit date: 1969  ?  Years since quitting: 54.2  ? Smokeless tobacco: Never  ?Vaping Use  ? Vaping Use: Never used  ?Substance and Sexual Activity  ? Alcohol use: No  ? Drug use: No  ? Sexual activity: Not on file  ?Other Topics Concern  ? Not on file  ?Social History Narrative  ? Not on file  ? ?Social Determinants of Health  ? ?Financial Resource Strain: Not on file  ?Food Insecurity: Not on file  ?Transportation Needs: Not on file  ?Physical Activity: Not on file  ?Stress: Not on file   ?Social Connections: Not on file  ?Intimate Partner Violence: Not on file  ?  ? ?Medications Prior to Admission  ?Medication Sig Dispense Refill Last Dose  ? acetaminophen (TYLENOL) 500 MG tablet Take 1,000 mg by mouth every 6 (six) hours as needed for mild pain.     ? amLODipine (NORVASC) 5 MG tablet Take 5 mg by mouth daily.     ? losartan (COZAAR) 100 MG tablet Take 100 mg by mouth daily.     ? pravastatin (PRAVACHOL) 20 MG tablet Take 20 mg by mouth daily.     ? ? ?Review of Systems  ?Constitutional:  ?     Generalized weakness  ?Cardiovascular:  Positive for dyspnea on exertion and leg swelling. Negative for chest pain, palpitations and syncope.  ?  ? ?Physical Exam: ?Physical Exam ?Vitals and nursing note reviewed.  ?Constitutional:   ?   General: He is not in acute distress. ?Neck:  ?   Vascular: No JVD.  ?Cardiovascular:  ?   Rate and Rhythm: Normal rate and regular rhythm. FrequentExtrasystoles are present. ?   Heart sounds: Murmur heard.  ?Harsh midsystolic murmur is present with a grade of 4/6 at the upper right sternal border radiating to the neck.  ?Pulmonary:  ?   Effort: Pulmonary effort is  normal.  ?   Breath sounds: Examination of the right-lower field reveals wheezing. Examination of the left-lower field reveals wheezing. Wheezing present. No rales.  ?Musculoskeletal:  ?   Right lower leg: Edema (Feet) present.  ?   Left lower leg: Edema (Feet) present.  ? ? ? ?  ?Lab Results: ?Reviewed and interpreted: ? Latest Reference Range & Units 12/12/21 23:37 12/13/21 01:41 12/13/21 05:57  ?B Natriuretic Peptide 0.0 - 100.0 pg/mL 960.3 (H)    ?Troponin I (High Sensitivity) <18 ng/L 95 (H) 102 (HH) 105 (HH)  ?(HH): Data is critically high ?(H): Data is abnormally high ? ?Imaging/tests reviewed and independently interpreted: ?CXR 12/12/2021: ?Minimal left basilar atelectasis. ? ?Cardiac Studies: ? ?Telemetry 12/13/2021: ?Frequent PVCs ? ?EKG 12/13/2021: ?Sinus tachycardia ?Supraventricular bigeminy ?Left atrial  enlargement ?Probable LVH with secondary repol abnrm ?Borderline prolonged QT interval ? ?Echocardiogram 07/09/2021:  ?Left ventricle cavity is normal in size. Moderate concentric hypertrophy  ?of the left ventricle. Normal global wall motion. Normal LV systolic  ?function with EF 55%. Doppler evidence of grade I (impaired) diastolic  ?dysfunction, normal LAP.  ?Left atrial cavity is mildly dilated. Aneurysmal interatrial septum  ?without 2D or color Doppler evidence of shunting.  ?Trileaflet aortic valve. Moderate aortic valve leaflet calcification.  ?Moderate aortic stenosis. Vmax 3.96 m/sec, mean PG 38 mmHg, AVA 0.6 cm? by  ?continuity equation. Dimensionless index 0.17. Moderate (Grade III) aortic  ?regurgitation. Moderate (Grade II) mitral regurgitation.  ?Mild tricuspid regurgitation.  ?No evidence of pulmonary hypertension. ?  ?Tests ordered: ?Echocardiogram ? ?Assessment & Recommendations: ? ?88 y.o. African-American male with hypertension, hyperlipidemia, severe AS, mod AI, now admitted with acute decompensated heart failure ? ?Acute on chronic HFpEF: ?Valvular cardiomyopathy due to severe AS, mod AI. ?High risk of worsening heart failure, syncope, death explained to the patient and wife Jobe Gibbon (over the phone). ?Patient is now willing to consider TAVR workup, which I hope can be done during this hospitalization. ?Risks/benefits discussed with the patient. There really isn't ay alternative option to his optimal care. ?Will proceed with coronary angiography and right heart cath this morning. ?Continue IV lasix 20 mg bid for now. ?Echocardiogram ordered.  ?Will speak with TAVR team. ? ?Discussed interpretation of tests and management recommendations with the primary team ? ?  ? ?Nigel Mormon, MD ?Pager: [phone] ?Office: [phone] ? ?

## 2021-12-13 NOTE — Assessment & Plan Note (Signed)
#)   Acute hypoxic respiratory failure: Not resolved; initially presented in acute fracture respiratory failure given initial oxygen saturations in the mid 80s on room air, on CPAP followed by BiPAP in the setting of acute respiratory symptoms, as above, and no known history of underlying baseline supplemental oxygen requirements.  Suspect that this was the basis of aforementioned acute on chronic diastolic heart failure, with ensuing good diuresis following initiation of IV Lasix resulting in ability to wean off of BiPAP, with patient now maintaining oxygen saturations in the mid to high 90s on room air.  Stated above, CT chest showed no evidence of infiltrate, or acute pulmonary embolism.  ?  ?Plan: Monitor continuous pulse oximetry.  Add on serum magnesium.  Check serum phosphorus level.  Trend troponin.  Echocardiogram ordered for the morning.  Monitor on telemetry. ?

## 2021-12-13 NOTE — Consult Note (Addendum)
[place] VALVE TEAM  Cardiology Consultation:   Patient ID: Olamiposi Topp MRN: [id number]; DOB: 17-Jan-1928  Admit date: 12/12/2021 Date of Consult: 12/13/2021  Primary Care Provider: Lavone Orn, MD [place] Cardiologist: Dr. Virgina Jock, MD   Patient Profile:   Ison Gaida is a 88 y.o. male with a hx of HTN, HLD, and severe aortic stenosis who is being seen today for the evaluation of possible TAVR at the request of Dr. Virgina Jock.  History of Present Illness:   Mr. Baumhardt is a very nice 88yo male who lives here in [place] with his wife of 59 years, their daughter and her husband. He is a retired Database administrator. He is very functional at home and is able to perform ADL/IADLs on his own. He ambulates in the home with a cane and uses a walker for longer distances, mainly for stability. He continues to mow his yard with a riding lawnmower without difficulty. He does not go to the dentist however has no active issues with his teeth. He reports he has been doing well with only intermittent SOB and LE edema until about two days ago when he says he was sitting and talking with his wife on the couch. When he went to stand up, his legs were weak and he became very SOB with speaking. They called EMS to the house and on their arrival, his O2 saturations were at 85% on RA, he was tripod breathing and diaphoretic. He was placed on CPAP with significant improvement. On arrival to [facility], BNP noted to be 960, CXR with minimal left basilar atelectasis. CTA with no PE. HsT elevated at 102>>105. He has been treated with IV Lasix with great UO. He is now willing to consider TAVR. He underwent R/LHC today which showed no obstructive CAD. Repeat echocardiogram is pending review. He is currently resting comfortably with no SOB, chest pain, palpitations, orthopnea, dizziness, or syncope.   Mr. Esmaili has been followed by Dr. Virgina Jock for his cardiology care. He was  admitted to the hospital 02/2021 for SOB, found to have a BNP >800, CXR showing atelectasis however no overt edema. He was treated with Lasix x5 days with symptoms resolution. Echocardiogram at that time showed an LVEF at 50-55% with mild LVH G1DD, mild MR, and severe aortic stenosis with a mean gradient at 47.16mmHg, peak gradient 74.57mmHg, and AVA by VTI measuring 0.90cm2.   He was then seen by Dr. Virgina Jock 04/22/21 and was doing well overall. He was ambulating in the home and mowing his lawn with no symptoms of SOB, chest pain, orthopnea, or dizziness. There was discussion about referral for TAVR workup however the patient was very reluctant despite discussion regarding the natural progression of the disease. Plan at that time was for repeat echocardiogram in 3 months with follow up. Repeat echo 07/09/21 showed LVEF at 55% with G1DD, moderate grade III aortic regurgitation and moderate grade II mitral regurgitation. Mean aortic valve gradient at 56mmHg with an AVA at 0.6cm2. There was mild TR. Fortunately at that time, he had no recurrent HF admissions and remained fairly stable. TAVR was again dicussed at which time the patient only wanted to consider if her became symptomatic.   Past Medical History:  Diagnosis Date   Aortic stenosis    Chronic diastolic heart failure ([place])    Hyperlipidemia    Hypertension    Spinal stenosis     Past Surgical History:  Procedure Laterality Date   BIOPSY BREAST  Home Medications:  Prior to Admission medications   Medication Sig Start Date End Date Taking? Authorizing Provider  acetaminophen (TYLENOL) 500 MG tablet Take 1,000 mg by mouth every 6 (six) hours as needed for mild pain.    [provider]  amLODipine (NORVASC) 5 MG tablet Take 5 mg by mouth daily. 04/16/21   [provider]  losartan (COZAAR) 100 MG tablet Take 100 mg by mouth daily. 12/31/20   [provider]  pravastatin (PRAVACHOL) 20 MG tablet Take 20 mg by  mouth daily. 12/31/20   [provider]    Inpatient Medications: Scheduled Meds:  aspirin  81 mg Oral Pre-Cath   [MAR Hold] furosemide  20 mg Intravenous BID   [MAR Hold] pravastatin  20 mg Oral Daily   sodium chloride flush  3 mL Intravenous Q12H   Continuous Infusions:  sodium chloride     sodium chloride     PRN Meds: sodium chloride, [MAR Hold] acetaminophen **OR** [MAR Hold] acetaminophen, Heparin (Porcine) in NaCl, sodium chloride flush  Allergies:    Allergies  Allergen Reactions   Benazepril Hcl     Other reaction(s): cough    Social History:   Social History   Socioeconomic History   Marital status: Married    Spouse name: Not on file   Number of children: Not on file   Years of education: Not on file   Highest education level: Not on file  Occupational History   Not on file  Tobacco Use   Smoking status: Former    Packs/day: 0.25    Years: 5.00    Pack years: 1.25    Types: Cigarettes    Quit date: 1969    Years since quitting: 54.2   Smokeless tobacco: Never  Vaping Use   Vaping Use: Never used  Substance and Sexual Activity   Alcohol use: No   Drug use: No   Sexual activity: Not on file  Other Topics Concern   Not on file  Social History Narrative   Not on file   Social Determinants of Health   Financial Resource Strain: Not on file  Food Insecurity: Not on file  Transportation Needs: Not on file  Physical Activity: Not on file  Stress: Not on file  Social Connections: Not on file  Intimate Partner Violence: Not on file    Family History:    Family History  Problem Relation Age of Onset   Cancer Mother    Stroke Father    ROS:  Please see the history of present illness.   All other ROS reviewed and negative.     Physical Exam/Data:   Vitals:   12/13/21 0500 12/13/21 0515 12/13/21 0550 12/13/21 0917  BP: (!) 145/85 (!) 156/68 (!) 157/100   Pulse: 82 77 83   Resp: 19 (!) 21 19   Temp:  98 F (36.7 C) 97.9 F (36.6  C)   TempSrc:  Oral Oral   SpO2: 97% 97% 98% 98%  Weight:   70.2 kg   Height:   5\' 5"  (1.651 m)     Intake/Output Summary (Last 24 hours) at 12/13/2021 0924 Last data filed at 12/13/2021 0550 Gross per 24 hour  Intake --  Output 700 ml  Net -700 ml   Last 3 Weights 12/13/2021 07/18/2021 04/22/2021  Weight (lbs) 154 lb 11.2 oz 157 lb 153 lb  Weight (kg) 70.171 kg 71.215 kg 69.4 kg     Body mass index is 25.74 kg/m.  General: Elderly, NAD Skin: Warm, dry, intact  Neck: Negative for carotid bruits. No JVD Lungs:Clear to ausculation bilaterally. No wheezes, rales, or rhonchi. Breathing is unlabored. Cardiovascular: RRR with S1 S2. Harsh murmur noted Abdomen: Soft, non-tender, non-distended. No obvious abdominal masses. Extremities: Mild 1-2 ankle edema   Neuro: Alert and oriented. No focal deficits. No facial asymmetry. MAE spontaneously. Psych: Responds to questions appropriately with normal affect.    EKG:  The EKG was personally reviewed and demonstrates: 12/13/21 ST with HR 105bpm. Telemetry:  Telemetry was personally reviewed and demonstrates: 12/13/21 NSR with HR in the 80's  Relevant CV Studies:  [facility] 12/13/21:  LM: Normal LAD: Prox focal 20% stenosis Lcx: Normal RCA: Mid focal 20% stenosis   No obstructive coronary artery disease   RA: 7 mmHg RV: 44/1 mmHg PA: 34/12 mmHg, mPAP 27 mmHg PCW: 11 mmHg   CO: 4.9 L/min CI: 2.8 L/min/m2   Valvular cardiomyopathy, now compensated at rest NYHA class III   Echocardiogram 07/09/2021:  Left ventricle cavity is normal in size. Moderate concentric hypertrophy  of the left ventricle. Normal global wall motion. Normal LV systolic  function with EF 55%. Doppler evidence of grade I (impaired) diastolic  dysfunction, normal LAP.  Left atrial cavity is mildly dilated. Aneurysmal interatrial septum  without 2D or color Doppler evidence of shunting.  Trileaflet aortic valve. Moderate aortic valve leaflet calcification.   Moderate aortic stenosis. Vmax 3.96 m/sec, mean PG 38 mmHg, AVA 0.6 cm by  continuity equation. Dimensionless index 0.17. Moderate (Grade III) aortic  regurgitation. Moderate (Grade II) mitral regurgitation.  Mild tricuspid regurgitation.  No evidence of pulmonary hypertension.  Echocardiogram 02/28/2021:  1. Left ventricular ejection fraction, by estimation, is 50 to 55%. The  left ventricle has low normal function. The left ventricle has no regional  wall motion abnormalities. There is mild left ventricular hypertrophy. Left ventricular diastolic parameters are consistent with  Grade I diastolic dysfunction (impaired  relaxation).   2. Right ventricular systolic function is normal. The right ventricular  size is normal. There is normal pulmonary artery systolic pressure.   3. Left atrial size was mildly dilated.   4. The mitral valve is normal in structure. Mild mitral valve  regurgitation. No evidence of mitral stenosis.   Laboratory Data:  High Sensitivity Troponin:   Recent Labs  Lab 12/12/21 2337 12/13/21 0141 12/13/21 0557  TROPONINIHS 95* 102* 105*     Chemistry Recent Labs  Lab 12/12/21 2337 12/13/21 0557  NA 138 138  K 3.3* 3.3*  CL 106 103  CO2 22 24  GLUCOSE 189* 125*  BUN 16 14  CREATININE 1.20 1.14  CALCIUM 8.7* 9.0  GFRNONAA 57* >60  ANIONGAP 10 11    Recent Labs  Lab 12/12/21 2337 12/13/21 0557  PROT 6.1* 6.6  ALBUMIN 3.6 3.7  AST 22 19  ALT 10 13  ALKPHOS 53 55  BILITOT 1.4* 1.3*   Hematology Recent Labs  Lab 12/12/21 2337 12/13/21 0557  WBC 7.5 9.1  RBC 3.50* 3.54*  HGB 11.4* 11.2*  HCT 35.1* 34.5*  MCV 100.3* 97.5  MCH 32.6 31.6  MCHC 32.5 32.5  RDW 13.7 13.7  PLT 197 195   BNP Recent Labs  Lab 12/12/21 2337  BNP 960.3*    DDimer No results for input(s): DDIMER in the last 168 hours.   Radiology/Studies:  CT Angio Chest Pulmonary Embolism (PE) W or WO Contrast  Result Date: 12/13/2021 CLINICAL DATA:  Shortness of  breath EXAM: CT  ANGIOGRAPHY CHEST WITH CONTRAST TECHNIQUE: Multidetector CT imaging of the chest was performed using the standard protocol during bolus administration of intravenous contrast. Multiplanar CT image reconstructions and MIPs were obtained to evaluate the vascular anatomy. RADIATION DOSE REDUCTION: This exam was performed according to the departmental dose-optimization program which includes automated exposure control, adjustment of the mA and/or kV according to patient size and/or use of iterative reconstruction technique. CONTRAST:  61mL OMNIPAQUE IOHEXOL 350 MG/ML SOLN COMPARISON:  Chest x-ray from earlier in the same day. FINDINGS: Cardiovascular: Atherosclerotic calcifications of the thoracic aorta and its branches are noted. No aneurysmal dilatation is seen. No cardiac enlargement is noted. Coronary calcifications are noted. The pulmonary artery shows a normal branching pattern bilaterally. No focal filling defect to suggest pulmonary embolism is seen. Mediastinum/Nodes: Thoracic inlet is within normal limits. No sizable hilar or mediastinal adenopathy is noted. The esophagus is within normal limits. Lungs/Pleura: Lungs demonstrate bilateral small pleural effusions. Associated basilar atelectasis is seen. Small nodule is noted in the right upper lobe laterally measuring less than 5 mm best seen on image number 25 of series 6. no other nodules are seen. Upper Abdomen: No acute abnormality is noted. Right renal cyst is seen measuring up to 5.6 cm. Musculoskeletal: Degenerative changes of the thoracic spine are noted. No rib abnormality is seen. Review of the MIP images confirms the above findings. IMPRESSION: No evidence of pulmonary emboli. 4 mm right solid pulmonary nodule within the upper lobe. No follow-up imaging is recommended. Reference: Radiology. 2017; 284(1):228-43. Bibasilar atelectasis is noted. Aortic Atherosclerosis (ICD10-I70.0). Electronically Signed   By: Alcide Clever M.D.   On:  12/13/2021 02:36   DG Chest Port 1 View  Result Date: 12/13/2021 CLINICAL DATA:  Shortness of breath. EXAM: PORTABLE CHEST 1 VIEW COMPARISON:  Chest x-ray 05/13/2017. FINDINGS: The heart size and mediastinal contours are within normal limits. There is minimal left basilar atelectasis. The lungs are otherwise clear. No pleural effusion or pneumothorax. The visualized skeletal structures are unremarkable. IMPRESSION: Minimal left basilar atelectasis. Electronically Signed   By: Darliss Cheney M.D.   On: 12/13/2021 00:00    STS Risk Calculator:  Procedure: AV Replacement  Risk of Mortality: 4.266% Renal Failure: 5.825% Permanent Stroke: 3.018% Prolonged Ventilation: 13.606% DSW Infection: 0.109% Reoperation: 5.444% Morbidity or Mortality: 20.526% Short Length of Stay: 15.452% Long Length of Stay: 12.710%  [facility] Cardiomyopathy Questionnaire  KCCQ-12 12/13/2021  1 a. Ability to shower/bathe Not at all limited  1 b. Ability to walk 1 block Slightly limited  1 c. Ability to hurry/jog Other, Did not do  2. Edema feet/ankles/legs 1-2 times a week  3. Limited by fatigue 1-2 times a week  4. Limited by dyspnea 1-2 times a week  5. Sitting up / on 3+ pillows Never over the past 2 weeks  6. Limited enjoyment of life Slightly limited  7. Rest of life w/ symptoms Somewhat satisfied  8 a. Participation in hobbies Slightly limited  8 b. Participation in chores Slightly limited  8 c. Visiting family/friends Slightly limited     Assessment and Plan:   Denorris Orduno is a 88 y.o. male with symptoms of severe, stage D1 aortic stenosis with NYHA Class III symptoms. I have reviewed the patient's recent echocardiogram which is notable for normal LV systolic function at 55% and severe aortic stenosis with peak gradient of 62.59mmHg and mean transvalvular gradient of 38.35mmHg. The patient's dimensionless index is 0.17 and calculated aortic valve area is 0.6 cm.  I have reviewed the natural  history of aortic stenosis with the patient. We have discussed the limitations of medical therapy and the poor prognosis associated with symptomatic aortic stenosis. We have reviewed potential treatment options, including palliative medical therapy, conventional surgical aortic valve replacement, and transcatheter aortic valve replacement. We discussed treatment options in the context of this patient's specific comorbid medical conditions.    The patient's predicted risk of mortality with conventional aortic valve replacement is 4.2% primarily based on advanced age, concurrent valvular disease with moderate MR, moderate AR, and mild TR, and progressing HF symptoms. TAVR seems like a reasonable treatment option for this patient pending formal cardiac surgical consultation. We discussed typical evaluation which will require a gated cardiac CTA and a CTA of the chest/abdomen/pelvis to evaluate both his cardiac anatomy and peripheral vasculature. He underwent R/LHC today which showed no obstructive CAD and will have an updated echocardiogram as well. The plan will be to obtain pre TAVR scans Monday if he remains inpatient over the weekend. Unfortunately, Dr. Cyndia Bent will be out of town for the next two weeks, limiting our availability to perform TAVRs at that time. If he is willing to proceed, we can schedule an appointment with Dr. Cyndia Bent in mid-May. In the meantime, we can work on optimizing his diuretic regimen to help prevent re-hospitalization.   For questions or updates, please contact [place] Please consult [email or web address] for contact info under    Signed, Kathyrn Drown, NP  12/13/2021 9:24 AM  I have personally seen and examined this patient. I agree with the assessment and plan as outlined above.   Mr. Martinetti is a 88 yo male with history of HTN, HLD, mild CAD by cath today and severe aortic stenosis who is admitted with acute on chronic diastolic CHF. He is very active at home. He is known to  have severe aortic stenosis by echo in 2022. He has chosen not to pursue TAVR workup up to this point as he was feeling well. He has been followed by Dr. Virgina Jock in the outpatient setting. He is now admitted with acute respiratory failure, hypoxia and volume overload. He has been diuresed and is feeling much better. Echo in October 2022 with severe AS with mean gradient 35 mmHg, AVA 0.6 cm2. LVEF=55%. Echo today with similar gradient across the aortic valve. Cardiac cath today with mild CAD. Overall normal filling pressures.  LE edema is resolved. He feels much better. Dyspnea resolved. No chest pain. No dizziness.   Labs reviewed.  Echo images and cath images reviewed.  EKG with sinus tachycardia with LVH  My exam:  Thin, elderly male in NAD.  Systolic murmur.  No JVD.  No LE edema.  Clear lungs.   Plan:  Severe Aortic Valve Stenosis: He has severe, stage D1 aortic valve stenosis. I have personally reviewed the echo images. The aortic valve is thickened, calcified with limited leaflet mobility. I think he would benefit from AVR. Given advanced age, he is not a candidate for conventional AVR by surgical approach. I think he may be a good candidate for TAVR.   I have reviewed the natural history of aortic stenosis with the patient and their family members  who are present today. We have discussed the limitations of medical therapy and the poor prognosis associated with symptomatic aortic stenosis. We have reviewed potential treatment options, including palliative medical therapy, conventional surgical aortic valve replacement, and transcatheter aortic valve replacement. We discussed treatment options in the context of the patient's  specific comorbid medical conditions.   He would like to proceed with planning for TAVR.We will plan a cardiac CT, CTA of the chest/abdomen and pelvis early next week. He can then be discharged and we will plan an outpatient visit with Dr. Cyndia Bent.   Lauree Chandler 12/13/2021 2:54 PM

## 2021-12-13 NOTE — Progress Notes (Signed)
? ?  Echocardiogram ?2D Echocardiogram has been performed. ? ?Festus Barren ?12/13/2021, 11:31 AM ?

## 2021-12-13 NOTE — Progress Notes (Signed)
Pt on his way down to cath lab. Consent signed. Pt verbalized understanding of procedure and it's indications. All questions and concerns addressed. ?

## 2021-12-13 NOTE — Assessment & Plan Note (Signed)
#)   Essential Hypertension: documented h/o such, with outpatient antihypertensive regimen including amlodipine as well as losartan.  SBP's in the ED today: In the 120s to 150s mmHg.  Particular in the context of plan for additional IV diuresis, will hold home antihypertensive medications for now given the potential for afterload dependence of borderline severe aortic stenosis, pending updated echocardiogram result, as further detailed above. ?  ?Plan: Close monitoring of subsequent BP via routine VS. hold home) medications for now, as above. ?

## 2021-12-13 NOTE — Progress Notes (Signed)
Removed pt from bipap. HR 80, sat 99% BP151/84. No resp distress noted. Will continue to monitor.  ?

## 2021-12-13 NOTE — Consult Note (Signed)
CARDIOLOGY CONSULT NOTE  ?Patient ID: ?Marcus Powers ?MRN: [id number] ?DOB/AGE: May 08, 1928 88 y.o. ? ?Admit date: 12/12/2021 ?Referring Physician: [facility] ?Reason for Consultation:  Severe AS, heart failure ? ?HPI:  ? ?88 y.o. African-American male with hypertension, hyperlipidemia, severe AS, mod AI, now admitted with acute decompensated heart failure ? ?Patient had previously been reluctant to consider TAVR workup as he had been only sporadically symptomatic. However, last two days, he has had severe shortness of breath with minimal activity, generalized weakness. He has also noticed swelling in both his feet. No chest pain. Workup shows elevated BNP, elevated but flat trop. He is currently asymptomatic at rest.  ? ?Past Medical History:  ?Diagnosis Date  ? Aortic stenosis   ? Chronic diastolic heart failure ([place])   ? Hyperlipidemia   ? Hypertension   ? Spinal stenosis   ?  ? ?Past Surgical History:  ?Procedure Laterality Date  ? BIOPSY BREAST    ?  ? ?\ ?Family History  ?Problem Relation Age of Onset  ? Cancer Mother   ? Stroke Father   ?  ? ?Social History: ?Social History  ? ?Socioeconomic History  ? Marital status: Married  ?  Spouse name: Not on file  ? Number of children: Not on file  ? Years of education: Not on file  ? Highest education level: Not on file  ?Occupational History  ? Not on file  ?Tobacco Use  ? Smoking status: Former  ?  Packs/day: 0.25  ?  Years: 5.00  ?  Pack years: 1.25  ?  Types: Cigarettes  ?  Quit date: 1969  ?  Years since quitting: 54.2  ? Smokeless tobacco: Never  ?Vaping Use  ? Vaping Use: Never used  ?Substance and Sexual Activity  ? Alcohol use: No  ? Drug use: No  ? Sexual activity: Not on file  ?Other Topics Concern  ? Not on file  ?Social History Narrative  ? Not on file  ? ?Social Determinants of Health  ? ?Financial Resource Strain: Not on file  ?Food Insecurity: Not on file  ?Transportation Needs: Not on file  ?Physical Activity: Not on file  ?Stress: Not on file   ?Social Connections: Not on file  ?Intimate Partner Violence: Not on file  ?  ? ?Medications Prior to Admission  ?Medication Sig Dispense Refill Last Dose  ? acetaminophen (TYLENOL) 500 MG tablet Take 1,000 mg by mouth every 6 (six) hours as needed for mild pain.     ? amLODipine (NORVASC) 5 MG tablet Take 5 mg by mouth daily.     ? losartan (COZAAR) 100 MG tablet Take 100 mg by mouth daily.     ? pravastatin (PRAVACHOL) 20 MG tablet Take 20 mg by mouth daily.     ? ? ?Review of Systems  ?Constitutional:  ?     Generalized weakness  ?Cardiovascular:  Positive for dyspnea on exertion and leg swelling. Negative for chest pain, palpitations and syncope.  ?  ? ?Physical Exam: ?Physical Exam ?Vitals and nursing note reviewed.  ?Constitutional:   ?   General: He is not in acute distress. ?Neck:  ?   Vascular: No JVD.  ?Cardiovascular:  ?   Rate and Rhythm: Normal rate and regular rhythm. FrequentExtrasystoles are present. ?   Heart sounds: Murmur heard.  ?Harsh midsystolic murmur is present with a grade of 4/6 at the upper right sternal border radiating to the neck.  ?Pulmonary:  ?   Effort: Pulmonary effort is  normal.  ?   Breath sounds: Examination of the right-lower field reveals wheezing. Examination of the left-lower field reveals wheezing. Wheezing present. No rales.  ?Musculoskeletal:  ?   Right lower leg: Edema (Feet) present.  ?   Left lower leg: Edema (Feet) present.  ? ? ? ?  ?Lab Results: ?Reviewed and interpreted: ? Latest Reference Range & Units 12/12/21 23:37 12/13/21 01:41 12/13/21 05:57  ?B Natriuretic Peptide 0.0 - 100.0 pg/mL 960.3 (H)    ?Troponin I (High Sensitivity) <18 ng/L 95 (H) 102 (HH) 105 (HH)  ?(HH): Data is critically high ?(H): Data is abnormally high ? ?Imaging/tests reviewed and independently interpreted: ?CXR 12/12/2021: ?Minimal left basilar atelectasis. ? ?Cardiac Studies: ? ?Telemetry 12/13/2021: ?Frequent PVCs ? ?EKG 12/13/2021: ?Sinus tachycardia ?Supraventricular bigeminy ?Left atrial  enlargement ?Probable LVH with secondary repol abnrm ?Borderline prolonged QT interval ? ?Echocardiogram 07/09/2021:  ?Left ventricle cavity is normal in size. Moderate concentric hypertrophy  ?of the left ventricle. Normal global wall motion. Normal LV systolic  ?function with EF 55%. Doppler evidence of grade I (impaired) diastolic  ?dysfunction, normal LAP.  ?Left atrial cavity is mildly dilated. Aneurysmal interatrial septum  ?without 2D or color Doppler evidence of shunting.  ?Trileaflet aortic valve. Moderate aortic valve leaflet calcification.  ?Moderate aortic stenosis. Vmax 3.96 m/sec, mean PG 38 mmHg, AVA 0.6 cm? by  ?continuity equation. Dimensionless index 0.17. Moderate (Grade III) aortic  ?regurgitation. Moderate (Grade II) mitral regurgitation.  ?Mild tricuspid regurgitation.  ?No evidence of pulmonary hypertension. ?  ?Tests ordered: ?Echocardiogram ? ?Assessment & Recommendations: ? ?88 y.o. African-American male with hypertension, hyperlipidemia, severe AS, mod AI, now admitted with acute decompensated heart failure ? ?Acute on chronic HFpEF: ?Valvular cardiomyopathy due to severe AS, mod AI. ?High risk of worsening heart failure, syncope, death explained to the patient and wife Jobe Gibbon (over the phone). ?Patient is now willing to consider TAVR workup, which I hope can be done during this hospitalization. ?Risks/benefits discussed with the patient. There really isn't ay alternative option to his optimal care. ?Will proceed with coronary angiography and right heart cath this morning. ?Continue IV lasix 20 mg bid for now. ?Echocardiogram ordered.  ?Will speak with TAVR team. ? ?Discussed interpretation of tests and management recommendations with the primary team ? ?  ? ?Nigel Mormon, MD ?Pager: [phone] ?Office: [phone] ? ?

## 2021-12-13 NOTE — Progress Notes (Signed)
Pt received from cath lab AxOx4 but slightly confused, VS wnL and as per flow. (R) wrist & (L) AC sites r C/D/I. All questions and concerns addressed. Call bell placed within reach, will continue to monitor sites and maintain patient safety.  ?

## 2021-12-13 NOTE — H&P (Signed)
History and Physical    PLEASE NOTE THAT DRAGON DICTATION SOFTWARE WAS USED IN THE CONSTRUCTION OF THIS NOTE.   Marcus Powers [id number] DOB: [birth year] DOA: 12/12/2021  PCP: Lavone Orn, MD  Patient coming from: home   I have personally briefly reviewed patient's old medical records in [place]  Chief Complaint: Shortness of breath  HPI: Marcus Powers is a 88 y.o. male with medical history significant for chronic diastolic heart failure, moderate aortic stenosis, hypertension, hyperlipidemia, who is admitted to [facility] on 12/12/2021 with acute on chronic diastolic heart failure after presenting from home to [facility] ED complaining of shortness of breath.   The patient reports 1 day of progressive shortness of breath associated with as well as 2 to 3 days of progressive edema in the bilateral lower extremities in the absence of calf tenderness or new lower extremity erythema.  He notes associated new onset wheezing and nonproductive cough.  Denies any associated chest pain, diaphoresis, palpitations, nausea, vomiting, presyncope, or syncope.  Denies history of any recent hemoptysis. He also denies any associated subjective fever, chills, rigors, or generalized myalgias.  Medical history notable for chronic diastolic heart failure, with most recent echocardiogram on 07/09/2021 notable for LVEF 44%, grade 1 diastolic dysfunction, mildly dilated left atrium, moderate aortic stenosis associated with aortic valve area of 0.6 cm2 per continuity equation, moderate mitral regurgitation, moderate aortic regurgitation.  He is not on any scheduled diuretic medications as an outpatient.  He is a former smoker, having completely quit smoking greater than 50 years ago after less than 3-pack-year history of smoking.  Denies any known chronic underlying pulmonary pathology, and confirms no known baseline supplemental oxygen requirements.  In the setting of progressive shortness of breath, the  patient contacted EMS this evening, who noted initial oxygen saturations to be in the mid 80s, prompting EMS to start CPAP.  Blood pressure was elevated at the time, prompting sublingual nitroglycerin to be administered via EMS.  Of note, he was immediately converted to BiPAP upon arrival at [facility] emergency department.    ED Course:  Vital signs in the ED were notable for the following: Afebrile; initial heart rate 104, with subsequent heart rates in the range of 89-97; blood pressure 122/76- 150/95 mmHg; initial respiratory rate 30, subsequently improving into the range of 19-23; oxygen saturation 100% on BiPAP with 40% FiO2, with subsequent removal of BiPAP leading to associated oxygen saturations of 96 to 97% on room air following interval IV diuresis via Lasix, as quantified below.  Labs were notable for the following: CMP notable for the following: Potassium 3.3, bicarbonate 22, creatinine 1.20 relative to most recent prior creatinine data point of 1.26 in May 2022.  BNP 960 compared to 826 in May 2022.  High-sensitivity troponin I initially 95, with repeat value trending up slightly to 182, which is compared to most recent prior value of 61 in May 2022.  CBC notable for white blood cell count 7500, hemoglobin 11.4 compared to 11.6 in May 2022.  COVID-19/influenza PCR negative.  Imaging and additional notable ED work-up: EKG, in comparison to most recent prior EKG from July 2022 shows sinus tachycardia with heart rate 103, nonspecific T wave flattening in V5/V6, which appears unchanged relative to most recent prior EKG, and no evidence of ST changes, including no evidence of ST elevation.  CTA chest showed no evidence of acute pulmonary embolism, while demonstrating evidence of small bilateral pleural effusions, bibasilar atelectasis, but no evidence of infiltrate or  pneumothorax.  While in the ED, the following were administered: Lasix 40 mg IV x1 dose.  Subsequently, the patient was admitted  for further evaluation and management of acute on chronic diastolic heart failure, with presentation also notable for hypokalemia and mildly elevated troponin in the context of improved acute hypoxic respiratory failure.     Review of Systems: As per HPI otherwise 10 point review of systems negative.   Past Medical History:  Diagnosis Date   Aortic stenosis    Chronic diastolic heart failure ([place])    Hyperlipidemia    Hypertension    Spinal stenosis     Past Surgical History:  Procedure Laterality Date   BIOPSY BREAST      Social History:  reports that he quit smoking about 54 years ago. His smoking use included cigarettes. He has a 1.25 pack-year smoking history. He has never used smokeless tobacco. He reports that he does not drink alcohol and does not use drugs.   Allergies  Allergen Reactions   Benazepril Hcl     Other reaction(s): cough    Family History  Problem Relation Age of Onset   Cancer Mother    Stroke Father     Family history reviewed and not pertinent    Prior to Admission medications   Medication Sig Start Date End Date Taking? Authorizing Provider  acetaminophen (TYLENOL) 500 MG tablet Take 1,000 mg by mouth every 6 (six) hours as needed for mild pain.    [provider]  amLODipine (NORVASC) 5 MG tablet Take 5 mg by mouth daily. 04/16/21   [provider]  losartan (COZAAR) 100 MG tablet Take 100 mg by mouth daily. 12/31/20   [provider]  pravastatin (PRAVACHOL) 20 MG tablet Take 20 mg by mouth daily. 12/31/20   [provider]     Objective    Physical Exam: Vitals:   12/13/21 0200 12/13/21 0215 12/13/21 0230 12/13/21 0245  BP: (!) 151/65 (!) 144/82 (!) 156/86 (!) 150/95  Pulse: 86 86 99 87  Resp: (!) 21 20 (!) 23 19  Temp:      TempSrc:      SpO2: 96% 97% 96% 97%    General: appears to be stated age; alert, oriented; mildly increased work of breathing noted Skin: warm, dry, no rash Head:   AT/[place] Mouth:  Oral mucosa membranes appear moist, normal dentition Neck: supple; trachea midline Heart:  RRR; did not appreciate any M/R/G Lungs: Bibasilar crackles, but otherwise CTAB, did not appreciate any wheezes,or rhonchi Abdomen: + BS; soft, ND, NT Vascular: 2+ pedal pulses b/l; 2+ radial pulses b/l Extremities: 1-2+ edema in the bilateral lower extremities, no muscle wasting Neuro: strength and sensation intact in upper and lower extremities b/l     Labs on Admission: I have personally reviewed following labs and imaging studies  CBC: Recent Labs  Lab 12/12/21 2337  WBC 7.5  NEUTROABS 5.2  HGB 11.4*  HCT 35.1*  MCV 100.3*  PLT 544   Basic Metabolic Panel: Recent Labs  Lab 12/12/21 2337  NA 138  K 3.3*  CL 106  CO2 22  GLUCOSE 189*  BUN 16  CREATININE 1.20  CALCIUM 8.7*   GFR: CrCl cannot be calculated (Unknown ideal weight.). Liver Function Tests: Recent Labs  Lab 12/12/21 2337  AST 22  ALT 10  ALKPHOS 53  BILITOT 1.4*  PROT 6.1*  ALBUMIN 3.6   No results for input(s): LIPASE, AMYLASE in the last 168 hours. No results  for input(s): AMMONIA in the last 168 hours. Coagulation Profile: No results for input(s): INR, PROTIME in the last 168 hours. Cardiac Enzymes: No results for input(s): CKTOTAL, CKMB, CKMBINDEX, TROPONINI in the last 168 hours. BNP (last 3 results) No results for input(s): PROBNP in the last 8760 hours. HbA1C: No results for input(s): HGBA1C in the last 72 hours. CBG: No results for input(s): GLUCAP in the last 168 hours. Lipid Profile: No results for input(s): CHOL, HDL, LDLCALC, TRIG, CHOLHDL, LDLDIRECT in the last 72 hours. Thyroid Function Tests: No results for input(s): TSH, T4TOTAL, FREET4, T3FREE, THYROIDAB in the last 72 hours. Anemia Panel: No results for input(s): VITAMINB12, FOLATE, FERRITIN, TIBC, IRON, RETICCTPCT in the last 72 hours. Urine analysis:    Component Value Date/Time   COLORURINE YELLOW 05/13/2017  1115   APPEARANCEUR CLEAR 05/13/2017 1115   LABSPEC 1.025 05/13/2017 1115   PHURINE 5.0 05/13/2017 1115   GLUCOSEU NEGATIVE 05/13/2017 1115   HGBUR NEGATIVE 05/13/2017 1115   BILIRUBINUR NEGATIVE 05/13/2017 1115   KETONESUR NEGATIVE 05/13/2017 1115   PROTEINUR NEGATIVE 05/13/2017 1115   NITRITE NEGATIVE 05/13/2017 1115   LEUKOCYTESUR NEGATIVE 05/13/2017 1115    Radiological Exams on Admission: CT Angio Chest Pulmonary Embolism (PE) W or WO Contrast  Result Date: 12/13/2021 CLINICAL DATA:  Shortness of breath EXAM: CT ANGIOGRAPHY CHEST WITH CONTRAST TECHNIQUE: Multidetector CT imaging of the chest was performed using the standard protocol during bolus administration of intravenous contrast. Multiplanar CT image reconstructions and MIPs were obtained to evaluate the vascular anatomy. RADIATION DOSE REDUCTION: This exam was performed according to the departmental dose-optimization program which includes automated exposure control, adjustment of the mA and/or kV according to patient size and/or use of iterative reconstruction technique. CONTRAST:  35mL OMNIPAQUE IOHEXOL 350 MG/ML SOLN COMPARISON:  Chest x-ray from earlier in the same day. FINDINGS: Cardiovascular: Atherosclerotic calcifications of the thoracic aorta and its branches are noted. No aneurysmal dilatation is seen. No cardiac enlargement is noted. Coronary calcifications are noted. The pulmonary artery shows a normal branching pattern bilaterally. No focal filling defect to suggest pulmonary embolism is seen. Mediastinum/Nodes: Thoracic inlet is within normal limits. No sizable hilar or mediastinal adenopathy is noted. The esophagus is within normal limits. Lungs/Pleura: Lungs demonstrate bilateral small pleural effusions. Associated basilar atelectasis is seen. Small nodule is noted in the right upper lobe laterally measuring less than 5 mm best seen on image number 25 of series 6. no other nodules are seen. Upper Abdomen: No acute  abnormality is noted. Right renal cyst is seen measuring up to 5.6 cm. Musculoskeletal: Degenerative changes of the thoracic spine are noted. No rib abnormality is seen. Review of the MIP images confirms the above findings. IMPRESSION: No evidence of pulmonary emboli. 4 mm right solid pulmonary nodule within the upper lobe. No follow-up imaging is recommended. Reference: Radiology. 2017; 284(1):228-43. Bibasilar atelectasis is noted. Aortic Atherosclerosis (ICD10-I70.0). Electronically Signed   By: Inez Catalina M.D.   On: 12/13/2021 02:36   DG Chest Port 1 View  Result Date: 12/13/2021 CLINICAL DATA:  Shortness of breath. EXAM: PORTABLE CHEST 1 VIEW COMPARISON:  Chest x-ray 05/13/2017. FINDINGS: The heart size and mediastinal contours are within normal limits. There is minimal left basilar atelectasis. The lungs are otherwise clear. No pleural effusion or pneumothorax. The visualized skeletal structures are unremarkable. IMPRESSION: Minimal left basilar atelectasis. Electronically Signed   By: Ronney Asters M.D.   On: 12/13/2021 00:00     EKG: Independently reviewed, with result as described  above.    Assessment/Plan   Principal Problem:   Acute on chronic diastolic heart failure (HCC) Active Problems:   Hypertension   Acute respiratory failure with hypoxia (HCC)   Hypokalemia   Hyperlipidemia      #) Acute on chronic diastolic heart failure: dx of acute decompensation on the basis of presenting 1 day of progressive shortness of breath associate with PND and associated with 2 to 3 days of progressive edema in the bilateral lower extremities, with evidence of increased work of breathing, acute hypoxic respiratory failure, as further defined below, elevated BNP.  Of note, CTA chest, which was performed while the patient was on BiPAP, potentially diminishing radiographic appearance of pre-existing interstitial/pulmonary edema, was notable for small bilateral pleural effusions, while showing no  evidence of acute PE, infiltrate, or pneumothorax.  This is in the context of a known history of chronic diastolic heart failure, with most recent echocardiogram performed in October 2022, which was notable for LVEF 60%, grade 1 diastolic dysfunction.  Of important note for the multiple valvular pathologies identified on this most recent echocardiogram, all of which can predispose the patient to ensuing development of acutely decompensated heart failure, including the presence of moderate aortic stenosis, which nearly met criteria for severe aortic stenosis at the time, as well as evidence of moderate mitral regurgitation as well as moderate aortic regurgitation.  We will order updated echocardiogram to further assess, including evaluation of interval trend in these multiple valvular pathologies.  Of note, the presence of moderate aortic stenosis, if subsequent progression to severe could complicate management of patient's heart failure due to associated preload and afterload dependence associated with this pathology when severe.  As noted above, patient is not a scheduled diuretic medications at home.  I suspect that mildly elevated initial troponin is a consequence of underlying acutely decompensated heart failure as opposed to representing ACS causing presenting acute heart failure exacerbation, particularly in the absence of any recent CP, and with presenting EKG showing no evidence of acute ischemic changes. However, will continue to evaluate with further trending of troponin and close monitoring on tele.     Plan: monitor strict I's & O's and daily weights. Monitor on telemetry. monitor continuous pulse oximetry. Repeat BMP in the morning, including for monitoring trend of potassium, bicarbonate, and renal function in response to interval diuresis efforts.  Potassium chloride 40 mill colons p.o. x1 dose now.  Check serum magnesium level . Close monitoring of ensuing blood pressure response to diuresis  efforts.  Echocardiogram ordered for the morning.  Trend troponin.  Lasix 20 mg IV twice daily, with close attention to interval trend in severity of aforementioned aortic stenosis.       #) Acute hypoxic respiratory failure: Not resolved; initially presented in acute fracture respiratory failure given initial oxygen saturations in the mid 80s on room air, on CPAP followed by BiPAP in the setting of acute respiratory symptoms, as above, and no known history of underlying baseline supplemental oxygen requirements.  Suspect that this was the basis of aforementioned acute on chronic diastolic heart failure, with ensuing good diuresis following initiation of IV Lasix resulting in ability to wean off of BiPAP, with patient now maintaining oxygen saturations in the mid to high 90s on room air.  Stated above, CT chest showed no evidence of infiltrate, or acute pulmonary embolism.   Plan: Monitor continuous pulse oximetry.  Add on serum magnesium.  Check serum phosphorus level.  Trend troponin.  Echocardiogram ordered for the  morning.  Monitor on telemetry.        #) Hypokalemia: Initial serum potassium 3.3.  This value was drawn prior to receipt of Lasix 40 mg IV x1.   Plan: Potassium chloride 40 mill equivalents p.o. x1 dose now.  Repeat CMP in the morning.  Add on serum magnesium level.  Monitor on telemetry.        #) Essential Hypertension: documented h/o such, with outpatient antihypertensive regimen including amlodipine as well as losartan.  SBP's in the ED today: In the 120s to 150s mmHg.  Particular in the context of plan for additional IV diuresis, will hold home antihypertensive medications for now given the potential for afterload dependence of borderline severe aortic stenosis, pending updated echocardiogram result, as further detailed above.  Plan: Close monitoring of subsequent BP via routine VS. hold home) medications for now, as above.         #) Hyperlipidemia:  documented h/o such. On pravastatin as outpatient.    Plan: continue home statin.     DVT prophylaxis: SCD's   Code Status: Full code Family Communication: none Disposition Plan: Per Rounding Team Consults called: none;  Admission status: Observation    PLEASE NOTE THAT DRAGON DICTATION SOFTWARE WAS USED IN THE CONSTRUCTION OF THIS NOTE.   [place] DO [facility]  From [place]   12/13/2021, 5:02 AM

## 2021-12-13 NOTE — Assessment & Plan Note (Signed)
#)  Acute on chronic diastolic heart failure: dx of acute decompensation on the basis of presenting 1 day of progressive shortness of breath associate with PND and associated with 2 to 3 days of progressive edema in the bilateral lower extremities, with evidence of increased work of breathing, acute hypoxic respiratory failure, as further defined below, elevated BNP.  Of note, CTA chest, which was performed while the patient was on BiPAP, potentially diminishing radiographic appearance of pre-existing interstitial/pulmonary edema, was notable for small bilateral pleural effusions, while showing no evidence of acute PE, infiltrate, or pneumothorax. ?  ?This is in the context of a known history of chronic diastolic heart failure, with most recent echocardiogram performed in October 2022, which was notable for LVEF 77%, grade 1 diastolic dysfunction.  Of important note for the multiple valvular pathologies identified on this most recent echocardiogram, all of which can predispose the patient to ensuing development of acutely decompensated heart failure, including the presence of moderate aortic stenosis, which nearly met criteria for severe aortic stenosis at the time, as well as evidence of moderate mitral regurgitation as well as moderate aortic regurgitation.  We will order updated echocardiogram to further assess, including evaluation of interval trend in these multiple valvular pathologies.  Of note, the presence of moderate aortic stenosis, if subsequent progression to severe could complicate management of patient's heart failure due to associated preload and afterload dependence associated with this pathology when severe.  As noted above, patient is not a scheduled diuretic medications at home. ?  ?I suspect that mildly elevated initial troponin is a consequence of underlying acutely decompensated heart failure as opposed to representing ACS causing presenting acute heart failure exacerbation, particularly in the  absence of any recent CP, and with presenting EKG showing no evidence of acute ischemic changes. However, will continue to evaluate with further trending of troponin and close monitoring on tele.  ?  ?  ?  ?Plan: monitor strict I's & O's and daily weights. Monitor on telemetry. monitor continuous pulse oximetry. Repeat BMP in the morning, including for monitoring trend of potassium, bicarbonate, and renal function in response to interval diuresis efforts.  Potassium chloride 40 mill colons p.o. x1 dose now.  Check serum magnesium level . Close monitoring of ensuing blood pressure response to diuresis efforts.  Echocardiogram ordered for the morning.  Trend troponin.  Lasix 20 mg IV twice daily, with close attention to interval trend in severity of aforementioned aortic stenosis. ?  ?  ?

## 2021-12-13 NOTE — Progress Notes (Signed)
Patient seen and examined in the morning rounds.  Admitted by nighttime hospitalist early morning hours.  In brief, 88 year old gentleman with severe aortic stenosis, hypertension and hyperlipidemia presented with about 2 to 3 days of progressive shortness of breath and leg edema.  Seen by cardiology as outpatient and recommended TAVR work-up, however unable.  Patient was asymptomatic in the morning.  He was going for cardiac cath.  I had called and discussed case with his cardiologist.  After discussing with cardiology, patient had agreed to TAVR work-up including cardiac cath and to be seen by structural heart disease team. ? ?Acute on chronic diastolic congestive heart failure secondary to valvular heart disease: We will continue low-dose Lasix today.  Replace potassium.  Recheck electrolytes.  Further management to be directed by structural heart failure team.  Anticipate he completes imaging studies and cardiac cath as inpatient and if he is a candidate, may go for TAVR as outpatient. ? ? ?Total time spent: 25 minutes.  Same-day visit.  No charge. ?

## 2021-12-13 NOTE — Assessment & Plan Note (Signed)
  #)   Hyperlipidemia: documented h/o such. On pravastatin as outpatient.    Plan: continue home statin.     

## 2021-12-13 NOTE — Interval H&P Note (Signed)
History and Physical Interval Note: ? ?12/13/2021 ?9:26 AM ? ?Marcus Powers  has presented today for surgery, with the diagnosis of severe as.  The various methods of treatment have been discussed with the patient and family. After consideration of risks, benefits and other options for treatment, the patient has consented to  Procedure(s): ?RIGHT/LEFT HEART CATH AND CORONARY ANGIOGRAPHY (N/A) as a surgical intervention.  The patient's history has been reviewed, patient examined, no change in status, stable for surgery.  I have reviewed the patient's chart and labs.  Questions were answered to the patient's satisfaction.   ? ?2012 Appropriate Use Criteria for Diagnostic Catheterization ?Valvular Disease ?(Right and Left Heart Catheterization or Right Heart Catheterization ?Alone With or Without Left Ventriculography and Coronary Angiography) ?Indication: ? ?Preoperative assessment before valvular surgery ?A (7) Indication: 70; Score 7 ?245 ? ?Marcus Powers Marcus Powers ? ? ?

## 2021-12-13 NOTE — ED Notes (Signed)
Pt removed from bipap by RT, SpO2 97% on room air. ?

## 2021-12-13 NOTE — Assessment & Plan Note (Signed)
#)   Hypokalemia: Initial serum potassium 3.3.  This value was drawn prior to receipt of Lasix 40 mg IV x1.  ?  ?Plan: Potassium chloride 40 mill equivalents p.o. x1 dose now.  Repeat CMP in the morning.  Add on serum magnesium level.  Monitor on telemetry. ?

## 2021-12-14 LAB — BASIC METABOLIC PANEL
Anion gap: 12 (ref 5–15)
BUN: 15 mg/dL (ref 8–23)
CO2: 24 mmol/L (ref 22–32)
Calcium: 9.4 mg/dL (ref 8.9–10.3)
Chloride: 104 mmol/L (ref 98–111)
Creatinine, Ser: 1.26 mg/dL — ABNORMAL HIGH (ref 0.61–1.24)
GFR, Estimated: 54 mL/min — ABNORMAL LOW (ref >60)
Glucose, Bld: 124 mg/dL — ABNORMAL HIGH (ref 70–99)
Potassium: 3.9 mmol/L (ref 3.5–5.1)
Sodium: 140 mmol/L (ref 135–145)

## 2021-12-14 LAB — MAGNESIUM: Magnesium: 1.9 mg/dL (ref 1.7–2.4)

## 2021-12-14 LAB — CBC WITH DIFFERENTIAL/PLATELET
Abs Immature Granulocytes: 0.03 10*3/uL (ref 0.00–0.07)
Basophils Absolute: 0.1 10*3/uL (ref 0.0–0.1)
Basophils Relative: 1 %
Eosinophils Absolute: 0.1 10*3/uL (ref 0.0–0.5)
Eosinophils Relative: 1 %
HCT: 37 % — ABNORMAL LOW (ref 39.0–52.0)
Hemoglobin: 12.1 g/dL — ABNORMAL LOW (ref 13.0–17.0)
Immature Granulocytes: 0 %
Lymphocytes Relative: 22 %
Lymphs Abs: 2.3 10*3/uL (ref 0.7–4.0)
MCH: 31.8 pg (ref 26.0–34.0)
MCHC: 32.7 g/dL (ref 30.0–36.0)
MCV: 97.1 fL (ref 80.0–100.0)
Monocytes Absolute: 1.2 10*3/uL — ABNORMAL HIGH (ref 0.1–1.0)
Monocytes Relative: 12 %
Neutro Abs: 6.7 10*3/uL (ref 1.7–7.7)
Neutrophils Relative %: 64 %
Platelets: 192 10*3/uL (ref 150–400)
RBC: 3.81 MIL/uL — ABNORMAL LOW (ref 4.22–5.81)
RDW: 13.9 % (ref 11.5–15.5)
WBC: 10.4 10*3/uL (ref 4.0–10.5)
nRBC: 0 % (ref 0.0–0.2)

## 2021-12-14 LAB — PHOSPHORUS: Phosphorus: 3 mg/dL (ref 2.5–4.6)

## 2021-12-14 MED ORDER — FUROSEMIDE 40 MG PO TABS: 40.0000 mg | ORAL_TABLET | Freq: Every day | ORAL | 11 refills | Status: DC

## 2021-12-14 NOTE — Progress Notes (Signed)
Patient had discarded all attachments including primofit, pulse ox, tele wires, and BP cuff and ambulated self to bathroom with walker. Increased confusion noted. Patient A&Ox2 (disoriented to time and place - said unsure of where he was and answered the year as '41). Has been forgetful; explained callbell and buttons multiple times but patient continues to ask how to do the lights/bed and mistakenly calls the desk repeatedly.  ?

## 2021-12-14 NOTE — Progress Notes (Addendum)
Subjective:  ?Breathing improved ?Leg swelling nearly resolved ? ?Episode of confusion overnight, was unaware where he was.  He is back to his normal baseline this morning. ? ?Objective:  ?Vital Signs in the last 24 hours: ?Temp:  [97.9 ?F (36.6 ?C)-98.3 ?F (36.8 ?C)] 97.9 ?F (36.6 ?C) (03/11 1331) ?Pulse Rate:  [86-91] 86 (03/11 0920) ?Resp:  [15-19] 18 (03/11 1331) ?BP: (122-150)/(70-97) 134/70 (03/11 1331) ?SpO2:  [100 %] 100 % (03/11 0920) ?Weight:  [67.1 kg] 67.1 kg (03/11 0445) ? ?Intake/Output from previous day: ?03/10 0701 - 03/11 0700 ?In: -  ?Out: 1350 [id number] ? ?Physical Exam ?Vitals and nursing note reviewed.  ?Constitutional:   ?   General: He is not in acute distress. ?Neck:  ?   Vascular: No JVD.  ?Cardiovascular:  ?   Rate and Rhythm: Normal rate and regular rhythm.  ?   Heart sounds: Murmur heard.  ?Harsh midsystolic murmur is present with a grade of 4/6 at the upper right sternal border radiating to the neck.  ?Pulmonary:  ?   Effort: Pulmonary effort is normal.  ?   Breath sounds: Normal breath sounds. No wheezing or rales.  ?Musculoskeletal:  ?   Right lower leg: Edema (Trace) present.  ?   Left lower leg: Edema (Trace) present.  ? ? ? ?Imaging/tests reviewed and independently interpreted: ? ?Cardiac Studies: ? ?Telemetry 12/14/2021: ?Occasional PVC ? ?EKG 12/12/2021: ?Sinus tachycardia ?Supraventricular bigeminy ?Left atrial enlargement ?Probable LVH with secondary repol abnrm ?Borderline prolonged QT interval ? ?Echocardiogram 12/13/2021: ? 1. Left ventricular ejection fraction, by estimation, is 50 to 55%. The  ?left ventricle has low normal function. The left ventricle has no regional  ?wall motion abnormalities. There is mild concentric left ventricular  ?hypertrophy. Left ventricular  ?diastolic parameters are consistent with Grade II diastolic dysfunction  ?(pseudonormalization).  ? 2. Right ventricular systolic function is normal. The right ventricular  ?size is normal. There is mildly  elevated pulmonary artery systolic  ?pressure.  ? 3. Left atrial size was moderately dilated.  ? 4. The mitral valve is degenerative. No evidence of mitral valve  ?regurgitation.  ? 5. Difficult assessment for mitral stenosis, suboptimal valve morpology,  ?continuity equation valve area deferred in the setting of AI.  ? 6. The aortic valve is calcified. Aortic valve regurgitation is mild to  ?moderate. Severe aortic valve stenosis. Aortic valve mean gradient  ?measures 35.0 mmHg. Aortic valve Vmax measures 3.74 m/s. Aortic valve  ?acceleration time measures 95 msec.  ?Decreased stroke voluem index.  ? 7. The inferior vena cava is normal in size with greater than 50%  ?respiratory variability, suggesting right atrial pressure of 3 mmHg.  ? ?Comparison(s): No significant change from prior study.  ? ?Coronary angiogram, [facility] 12/13/2021: ?LM: Normal ?LAD: Prox focal 20% stenosis ?Lcx: Normal ?RCA: Mid focal 20% stenosis ?  ?No obstructive coronary artery disease ?  ?RA: 7 mmHg ?RV: 44/1 mmHg ?PA: 34/12 mmHg, mPAP 27 mmHg ?PCW: 11 mmHg ?  ?CO: 4.9 L/min ?CI: 2.8 L/min/m2 ?  ?Valvular cardiomyopathy, now compensated at rest ?NYHA class III ?  ?Okay to perform TAVR outpatient. Continue pre-TAVR workup. ? ?Assessment & Recommendations: ? ?88 y.o. African-American male with hypertension, hyperlipidemia, severe AS, mod AI, now admitted with acute decompensated heart failure ?  ?Acute on chronic HFpEF: ?Valvular cardiomyopathy due to symptomatic stage D1 severe AS, mod AI. ?No significant coronary artery disease on coronary angiogram, fairly compensated filling pressures on right heart cath 12/13/2021 ?Appreciate TAVR team  consult.  Plan for outpatient TAVR. ? ?Initial plan was to perform inpatient TAVR work-up.  However, patient had an episode of hospital delirium overnight on 12/13/2021.  He is well compensated today.  I think it is reasonable to discharge him on Lasix 40 mg p.o. daily and arrange outpatient work-up.  I will  communicate this to the TAVR team. ? ?  ?Discussed interpretation of tests and management recommendations with the primary team ?  ? ? ?Nigel Mormon, MD ?Pager: [phone] ?Office: [phone] ? ?

## 2021-12-14 NOTE — Discharge Summary (Signed)
Physician Discharge Summary  Marcus Powers DOB: 1927-10-18 DOA: 12/12/2021  PCP: Lavone Orn, MD  Admit date: 12/12/2021 Discharge date: 12/14/2021  Admitted From: Home Disposition: Home  Recommendations for Outpatient Follow-up:  Follow up with PCP in 1-2 weeks Please obtain BMP/CBC in one week Cardiology and structural heart team will schedule follow-up with you.  Home Health: N/A Equipment/Devices: N/A  Discharge Condition: Stable CODE STATUS: Full code Diet recommendation: Low-salt diet  Discharge summary:  88 year old gentleman with history of hypertension and hyperlipidemia, severe aortic stenosis reluctant to TAVR work-up came to the emergency room with 2 to 3 days of shortness of breath and leg swelling.  He was admitted and treated with IV Lasix with good clinical response.  Underwent cardiac catheterization with no significant coronary artery disease.  Patient was deemed not a surgical candidate for open heart surgery and valve replacement, however he was deemed appropriate for possible TAVR.  Seen by structural heart team.  Plan for TAVR work-up including CT angiograms.  Since patient is stable today and studies could be done as outpatient.  As per his primary cardiologist, we will discharge him home, discontinue hydrochlorothiazide.  We will start on Lasix 40 mg daily starting 3/12.  Patient is on multiple blood pressure medications.  Stable without additional medications.  We will discontinue amlodipine.  Continue losartan. Patient was expecting inpatient CT angiograms, however he did fairly well and today he is with very good control of symptoms.  He will be going home, cardiology will complete TAVR work-up.   Discharge Diagnoses:  Principal Problem:   Acute on chronic diastolic heart failure (HCC) Active Problems:   Hypertension   Acute respiratory failure with hypoxia (HCC)   Hypokalemia   Hyperlipidemia   Severe aortic valve stenosis    Discharge  Instructions  Discharge Instructions     Call MD for:  difficulty breathing, headache or visual disturbances   Complete by: As directed    Call MD for:  extreme fatigue   Complete by: As directed    Diet - low sodium heart healthy   Complete by: As directed    Increase activity slowly   Complete by: As directed       Allergies as of 12/14/2021       Reactions   Benazepril Hcl Cough        Medication List     STOP taking these medications    amLODipine 5 MG tablet Commonly known as: NORVASC   hydrochlorothiazide 12.5 MG tablet Commonly known as: HYDRODIURIL       TAKE these medications    acetaminophen 500 MG tablet Commonly known as: TYLENOL Take 1,000 mg by mouth every 6 (six) hours as needed for mild pain.   furosemide 40 MG tablet Commonly known as: Lasix Take 1 tablet (40 mg total) by mouth daily. Start taking on: December 15, 2021   losartan 100 MG tablet Commonly known as: COZAAR Take 100 mg by mouth daily.   pravastatin 20 MG tablet Commonly known as: PRAVACHOL Take 20 mg by mouth daily.        Follow-up Information     Nigel Mormon, MD Follow up on 12/20/2021.   Specialties: Cardiology, Radiology Why: 11 AM Contact information: [place] [address] [phone]                Allergies  Allergen Reactions   Benazepril Hcl Cough    Consultations: Cardiology   Procedures/Studies: CT Angio  Chest Pulmonary Embolism (PE) W or WO Contrast  Result Date: 12/13/2021 CLINICAL DATA:  Shortness of breath EXAM: CT ANGIOGRAPHY CHEST WITH CONTRAST TECHNIQUE: Multidetector CT imaging of the chest was performed using the standard protocol during bolus administration of intravenous contrast. Multiplanar CT image reconstructions and MIPs were obtained to evaluate the vascular anatomy. RADIATION DOSE REDUCTION: This exam was performed according to the departmental dose-optimization program which includes  automated exposure control, adjustment of the mA and/or kV according to patient size and/or use of iterative reconstruction technique. CONTRAST:  77mL OMNIPAQUE IOHEXOL 350 MG/ML SOLN COMPARISON:  Chest x-ray from earlier in the same day. FINDINGS: Cardiovascular: Atherosclerotic calcifications of the thoracic aorta and its branches are noted. No aneurysmal dilatation is seen. No cardiac enlargement is noted. Coronary calcifications are noted. The pulmonary artery shows a normal branching pattern bilaterally. No focal filling defect to suggest pulmonary embolism is seen. Mediastinum/Nodes: Thoracic inlet is within normal limits. No sizable hilar or mediastinal adenopathy is noted. The esophagus is within normal limits. Lungs/Pleura: Lungs demonstrate bilateral small pleural effusions. Associated basilar atelectasis is seen. Small nodule is noted in the right upper lobe laterally measuring less than 5 mm best seen on image number 25 of series 6. no other nodules are seen. Upper Abdomen: No acute abnormality is noted. Right renal cyst is seen measuring up to 5.6 cm. Musculoskeletal: Degenerative changes of the thoracic spine are noted. No rib abnormality is seen. Review of the MIP images confirms the above findings. IMPRESSION: No evidence of pulmonary emboli. 4 mm right solid pulmonary nodule within the upper lobe. No follow-up imaging is recommended. Reference: Radiology. 2017; 284(1):228-43. Bibasilar atelectasis is noted. Aortic Atherosclerosis (ICD10-I70.0). Electronically Signed   By: Inez Catalina M.D.   On: 12/13/2021 02:36   CARDIAC CATHETERIZATION  Result Date: 12/13/2021 Images from the original result were not included. LM: Normal LAD: Prox focal 20% stenosis Lcx: Normal RCA: Mid focal 20% stenosis No obstructive coronary artery disease RA: 7 mmHg RV: 44/1 mmHg PA: 34/12 mmHg, mPAP 27 mmHg PCW: 11 mmHg CO: 4.9 L/min CI: 2.8 L/min/m2 Valvular cardiomyopathy, now compensated at rest NYHA class III Okay to  perform TAVR outpatient. Continue pre-TAVR workup. Nigel Mormon, MD Pager: [phone] Office: [phone]   DG Chest Port 1 View  Result Date: 12/13/2021 CLINICAL DATA:  Shortness of breath. EXAM: PORTABLE CHEST 1 VIEW COMPARISON:  Chest x-ray 05/13/2017. FINDINGS: The heart size and mediastinal contours are within normal limits. There is minimal left basilar atelectasis. The lungs are otherwise clear. No pleural effusion or pneumothorax. The visualized skeletal structures are unremarkable. IMPRESSION: Minimal left basilar atelectasis. Electronically Signed   By: Ronney Asters M.D.   On: 12/13/2021 00:00   ECHOCARDIOGRAM COMPLETE  Result Date: 12/13/2021    ECHOCARDIOGRAM REPORT   Patient Name:   Marcus Powers Date of Exam: 12/13/2021 Medical Rec #:  [id number]    Height:       65.0 in Accession #:    [id number]   Weight:       154.7 lb Date of Birth:  1928/03/28    BSA:          1.774 m Patient Age:    69 years     BP:           157/100 mmHg Patient Gender: M            HR:           77 bpm. Exam Location:  Inpatient Procedure: 2D  Echo, Cardiac Doppler and Color Doppler Indications:    I50.31 CHF  History:        Patient has prior history of Echocardiogram examinations, most                 recent 02/28/2021. Aortic Valve Disease; Risk                 Factors:Hypertension and Dyslipidemia. CHRONIC HEART FAILURE /                 SEVERE AOV STENOSIS.  Sonographer:    Beryle Beams Referring Phys: [id number] Hiko  1. Left ventricular ejection fraction, by estimation, is 50 to 55%. The left ventricle has low normal function. The left ventricle has no regional wall motion abnormalities. There is mild concentric left ventricular hypertrophy. Left ventricular diastolic parameters are consistent with Grade II diastolic dysfunction (pseudonormalization).  2. Right ventricular systolic function is normal. The right ventricular size is normal. There is mildly elevated pulmonary artery  systolic pressure.  3. Left atrial size was moderately dilated.  4. The mitral valve is degenerative. No evidence of mitral valve regurgitation.  5. Difficult assessment for mitral stenosis, suboptimal valve morpology, continuity equation valve area deferred in the setting of AI.  6. The aortic valve is calcified. Aortic valve regurgitation is mild to moderate. Severe aortic valve stenosis. Aortic valve mean gradient measures 35.0 mmHg. Aortic valve Vmax measures 3.74 m/s. Aortic valve acceleration time measures 95 msec. Decreased stroke voluem index.  7. The inferior vena cava is normal in size with greater than 50% respiratory variability, suggesting right atrial pressure of 3 mmHg. Comparison(s): No significant change from prior study. FINDINGS  Left Ventricle: Left ventricular ejection fraction, by estimation, is 50 to 55%. The left ventricle has low normal function. The left ventricle has no regional wall motion abnormalities. The left ventricular internal cavity size was normal in size. There is mild concentric left ventricular hypertrophy. Left ventricular diastolic parameters are consistent with Grade II diastolic dysfunction (pseudonormalization). Right Ventricle: The right ventricular size is normal. No increase in right ventricular wall thickness. Right ventricular systolic function is normal. There is mildly elevated pulmonary artery systolic pressure. The tricuspid regurgitant velocity is 3.00  m/s, and with an assumed right atrial pressure of 3 mmHg, the estimated right ventricular systolic pressure is 123XX123 mmHg. Left Atrium: Left atrial size was moderately dilated. Right Atrium: Right atrial size was normal in size. Pericardium: There is no evidence of pericardial effusion. Mitral Valve: The mitral valve is degenerative in appearance. No evidence of mitral valve regurgitation. No evidence of mitral valve stenosis. Tricuspid Valve: The tricuspid valve is normal in structure. Tricuspid valve  regurgitation is mild . No evidence of tricuspid stenosis. Aortic Valve: The aortic valve is calcified. Aortic valve regurgitation is mild to moderate. Aortic regurgitation PHT measures 315 msec. Severe aortic stenosis is present. Aortic valve mean gradient measures 35.0 mmHg. Aortic valve peak gradient measures  56.0 mmHg. Aortic valve area, by VTI measures 0.39 cm. Pulmonic Valve: The pulmonic valve was normal in structure. Pulmonic valve regurgitation is not visualized. Aorta: The aortic root and ascending aorta are structurally normal, with no evidence of dilitation. Venous: The inferior vena cava is normal in size with greater than 50% respiratory variability, suggesting right atrial pressure of 3 mmHg. IAS/Shunts: No atrial level shunt detected by color flow Doppler.  LEFT VENTRICLE PLAX 2D LVIDd:         4.50 cm  Diastology LVIDs:         3.25 cm      LV e' medial:    5.91 cm/s LV PW:         1.20 cm      LV E/e' medial:  18.3 LV IVS:        1.10 cm      LV e' lateral:   7.93 cm/s LVOT diam:     1.50 cm      LV E/e' lateral: 13.6 LV SV:         32 LV SV Index:   18 LVOT Area:     1.77 cm  LV Volumes (MOD) LV vol d, MOD A2C: 73.6 ml LV vol d, MOD A4C: 107.0 ml LV vol s, MOD A2C: 38.1 ml LV vol s, MOD A4C: 43.1 ml LV SV MOD A2C:     35.5 ml LV SV MOD A4C:     107.0 ml LV SV MOD BP:      47.7 ml RIGHT VENTRICLE             IVC RV S prime:     16.30 cm/s  IVC diam: 1.90 cm TAPSE (M-mode): 2.6 cm LEFT ATRIUM             Index        RIGHT ATRIUM          Index LA diam:        4.30 cm 2.42 cm/m   RA Area:     7.78 cm LA Vol (A2C):   79.7 ml 44.94 ml/m  RA Volume:   14.10 ml 7.95 ml/m LA Vol (A4C):   73.5 ml 41.44 ml/m LA Biplane Vol: 77.3 ml 43.58 ml/m  AORTIC VALVE AV Area (Vmax):    0.33 cm AV Area (Vmean):   0.31 cm AV Area (VTI):     0.39 cm AV Vmax:           374.00 cm/s AV Vmean:          282.000 cm/s AV VTI:            0.819 m AV Peak Grad:      56.0 mmHg AV Mean Grad:      35.0 mmHg LVOT  Vmax:         70.30 cm/s LVOT Vmean:        49.900 cm/s LVOT VTI:          0.183 m LVOT/AV VTI ratio: 0.22 AI PHT:            315 msec  AORTA Ao Root diam: 2.30 cm Ao Asc diam:  2.30 cm MITRAL VALVE                TRICUSPID VALVE MV Area (PHT): 2.66 cm     TR Peak grad:   36.0 mmHg MV Decel Time: 285 msec     TR Mean grad:   22.0 mmHg MR Peak grad: 134.6 mmHg    TR Vmax:        300.00 cm/s MR Mean grad: 97.0 mmHg     TR Vmean:       224.0 cm/s MR Vmax:      580.00 cm/s MR Vmean:     469.0 cm/s    SHUNTS MV E velocity: 108.00 cm/s  Systemic VTI:  0.18 m MV A velocity: 161.00 cm/s  Systemic Diam: 1.50 cm MV E/A ratio:  0.67 Rudean Haskell MD Electronically signed by Lyda Kalata  Chandrasekhar MD Signature Date/Time: 12/13/2021/3:37:46 PM    Final    (Echo, Carotid, EGD, Colonoscopy, ERCP)    Subjective: Patient seen and examined.  Overnight he had episodes of agitation but reoriented.  In the morning rounds denies any chest pain or palpitations.  Wants to go home. I called and updated patient's wife and both of them agreed to go home and keep up outpatient follow-ups.   Discharge Exam: Vitals:   12/14/21 0920 12/14/21 1331  BP: (!) 122/97 134/70  Pulse: 86   Resp: 19 18  Temp: 98.1 F (36.7 C) 97.9 F (36.6 C)  SpO2: 100%    Vitals:   12/13/21 2010 12/14/21 0445 12/14/21 0920 12/14/21 1331  BP: 134/76 (!) 150/77 (!) 122/97 134/70  Pulse: 91 91 86   Resp: (!) 21 15 19 18   Temp: 97.8 F (36.6 C) 98.3 F (36.8 C) 98.1 F (36.7 C) 97.9 F (36.6 C)  TempSrc: Oral Oral Oral Oral  SpO2: 100%  100%   Weight:  67.1 kg    Height:        General: Pt is alert, awake, not in acute distress On room air.  Looks comfortable.  He was eating breakfast in the morning rounds. Cardiovascular: RRR, [id number] +, pansystolic murmur at aortic area. Respiratory: CTA bilaterally, no wheezing, no rhonchi Abdominal: Soft, NT, ND, bowel sounds + Extremities: no edema, no cyanosis    The results of  significant diagnostics from this hospitalization (including imaging, microbiology, ancillary and laboratory) are listed below for reference.     Microbiology: Recent Results (from the past 240 hour(s))  Resp Panel by RT-PCR (Flu A&B, Covid) Nasopharyngeal Swab     Status: None   Collection Time: 12/12/21 11:34 PM   Specimen: Nasopharyngeal Swab; Nasopharyngeal(NP) swabs in vial transport medium  Result Value Ref Range Status   SARS Coronavirus 2 by RT PCR NEGATIVE NEGATIVE Final    Comment: (NOTE) SARS-CoV-2 target nucleic acids are NOT DETECTED.  The SARS-CoV-2 RNA is generally detectable in upper respiratory specimens during the acute phase of infection. The lowest concentration of SARS-CoV-2 viral copies this assay can detect is 138 copies/mL. A negative result does not preclude SARS-Cov-2 infection and should not be used as the sole basis for treatment or other patient management decisions. A negative result may occur with  improper specimen collection/handling, submission of specimen other than nasopharyngeal swab, presence of viral mutation(s) within the areas targeted by this assay, and inadequate number of viral copies(<138 copies/mL). A negative result must be combined with clinical observations, patient history, and epidemiological information. The expected result is Negative.  Fact Sheet for Patients:  EntrepreneurPulse.com.au  Fact Sheet for Healthcare Providers:  IncredibleEmployment.be  This test is no t yet approved or cleared by the [place] FDA and  has been authorized for detection and/or diagnosis of SARS-CoV-2 by FDA under an Emergency Use Authorization (EUA). This EUA will remain  in effect (meaning this test can be used) for the duration of the COVID-19 declaration under Section 564(b)(1) of the Act, 21 U.S.C.section 360bbb-3(b)(1), unless the authorization is terminated  or revoked sooner.       Influenza A by  PCR NEGATIVE NEGATIVE Final   Influenza B by PCR NEGATIVE NEGATIVE Final    Comment: (NOTE) The Xpert Xpress SARS-CoV-2/FLU/RSV plus assay is intended as an aid in the diagnosis of influenza from Nasopharyngeal swab specimens and should not be used as a sole basis for treatment. Nasal washings and aspirates are unacceptable for  Xpert Xpress SARS-CoV-2/FLU/RSV testing.  Fact Sheet for Patients: EntrepreneurPulse.com.au  Fact Sheet for Healthcare Providers: IncredibleEmployment.be  This test is not yet approved or cleared by the [place] FDA and has been authorized for detection and/or diagnosis of SARS-CoV-2 by FDA under an Emergency Use Authorization (EUA). This EUA will remain in effect (meaning this test can be used) for the duration of the COVID-19 declaration under Section 564(b)(1) of the Act, 21 U.S.C. section 360bbb-3(b)(1), unless the authorization is terminated or revoked.  Performed at [facility], [place] [address], [place], [place] [address]      Labs: BNP (last 3 results) Recent Labs    02/10/21 0013 12/12/21 2337  BNP 826.7* 99991111*   Basic Metabolic Panel: Recent Labs  Lab 12/12/21 2337 12/13/21 0557 12/13/21 0955 12/14/21 0151  NA 138 138 142   143 140  K 3.3* 3.3* 3.5   3.6 3.9  CL 106 103  --  104  CO2 22 24  --  24  GLUCOSE 189* 125*  --  124*  BUN 16 14  --  15  CREATININE 1.20 1.14  --  1.26*  CALCIUM 8.7* 9.0  --  9.4  MG  --  2.0  --  1.9  PHOS  --  3.1  --  3.0   Liver Function Tests: Recent Labs  Lab 12/12/21 2337 12/13/21 0557  AST 22 19  ALT 10 13  ALKPHOS 53 55  BILITOT 1.4* 1.3*  PROT 6.1* 6.6  ALBUMIN 3.6 3.7   No results for input(s): LIPASE, AMYLASE in the last 168 hours. No results for input(s): AMMONIA in the last 168 hours. CBC: Recent Labs  Lab 12/12/21 2337 12/13/21 0557 12/13/21 0955 12/14/21 0151  WBC 7.5 9.1  --  10.4  NEUTROABS 5.2 7.0  --  6.7  HGB 11.4*  11.2* 10.9*   10.9* 12.1*  HCT 35.1* 34.5* 32.0*   32.0* 37.0*  MCV 100.3* 97.5  --  97.1  PLT 197 195  --  192   Cardiac Enzymes: No results for input(s): CKTOTAL, CKMB, CKMBINDEX, TROPONINI in the last 168 hours. BNP: Invalid input(s): POCBNP CBG: No results for input(s): GLUCAP in the last 168 hours. D-Dimer No results for input(s): DDIMER in the last 72 hours. Hgb A1c No results for input(s): HGBA1C in the last 72 hours. Lipid Profile No results for input(s): CHOL, HDL, LDLCALC, TRIG, CHOLHDL, LDLDIRECT in the last 72 hours. Thyroid function studies No results for input(s): TSH, T4TOTAL, T3FREE, THYROIDAB in the last 72 hours.  Invalid input(s): FREET3 Anemia work up No results for input(s): VITAMINB12, FOLATE, FERRITIN, TIBC, IRON, RETICCTPCT in the last 72 hours. Urinalysis    Component Value Date/Time   COLORURINE YELLOW 05/13/2017 1115   APPEARANCEUR CLEAR 05/13/2017 1115   LABSPEC 1.025 05/13/2017 1115   PHURINE 5.0 05/13/2017 1115   GLUCOSEU NEGATIVE 05/13/2017 1115   HGBUR NEGATIVE 05/13/2017 1115   BILIRUBINUR NEGATIVE 05/13/2017 1115   KETONESUR NEGATIVE 05/13/2017 1115   PROTEINUR NEGATIVE 05/13/2017 1115   NITRITE NEGATIVE 05/13/2017 1115   LEUKOCYTESUR NEGATIVE 05/13/2017 1115   Sepsis Labs Invalid input(s): PROCALCITONIN,  WBC,  LACTICIDVEN Microbiology Recent Results (from the past 240 hour(s))  Resp Panel by RT-PCR (Flu A&B, Covid) Nasopharyngeal Swab     Status: None   Collection Time: 12/12/21 11:34 PM   Specimen: Nasopharyngeal Swab; Nasopharyngeal(NP) swabs in vial transport medium  Result Value Ref Range Status   SARS Coronavirus 2 by RT PCR NEGATIVE  NEGATIVE Final    Comment: (NOTE) SARS-CoV-2 target nucleic acids are NOT DETECTED.  The SARS-CoV-2 RNA is generally detectable in upper respiratory specimens during the acute phase of infection. The lowest concentration of SARS-CoV-2 viral copies this assay can detect is 138 copies/mL. A  negative result does not preclude SARS-Cov-2 infection and should not be used as the sole basis for treatment or other patient management decisions. A negative result may occur with  improper specimen collection/handling, submission of specimen other than nasopharyngeal swab, presence of viral mutation(s) within the areas targeted by this assay, and inadequate number of viral copies(<138 copies/mL). A negative result must be combined with clinical observations, patient history, and epidemiological information. The expected result is Negative.  Fact Sheet for Patients:  EntrepreneurPulse.com.au  Fact Sheet for Healthcare Providers:  IncredibleEmployment.be  This test is no t yet approved or cleared by the [place] FDA and  has been authorized for detection and/or diagnosis of SARS-CoV-2 by FDA under an Emergency Use Authorization (EUA). This EUA will remain  in effect (meaning this test can be used) for the duration of the COVID-19 declaration under Section 564(b)(1) of the Act, 21 U.S.C.section 360bbb-3(b)(1), unless the authorization is terminated  or revoked sooner.       Influenza A by PCR NEGATIVE NEGATIVE Final   Influenza B by PCR NEGATIVE NEGATIVE Final    Comment: (NOTE) The Xpert Xpress SARS-CoV-2/FLU/RSV plus assay is intended as an aid in the diagnosis of influenza from Nasopharyngeal swab specimens and should not be used as a sole basis for treatment. Nasal washings and aspirates are unacceptable for Xpert Xpress SARS-CoV-2/FLU/RSV testing.  Fact Sheet for Patients: EntrepreneurPulse.com.au  Fact Sheet for Healthcare Providers: IncredibleEmployment.be  This test is not yet approved or cleared by the [place] FDA and has been authorized for detection and/or diagnosis of SARS-CoV-2 by FDA under an Emergency Use Authorization (EUA). This EUA will remain in effect (meaning this test can  be used) for the duration of the COVID-19 declaration under Section 564(b)(1) of the Act, 21 U.S.C. section 360bbb-3(b)(1), unless the authorization is terminated or revoked.  Performed at [facility], [place] [address] [place], [place] [address]      Time coordinating discharge: 32 minutes  SIGNED:   Barb Merino, MD  [facility] 12/14/2021, 2:00 PM

## 2021-12-16 ENCOUNTER — Other Ambulatory Visit: Payer: Self-pay

## 2021-12-16 DIAGNOSIS — I350 Nonrheumatic aortic (valve) stenosis: Secondary | ICD-10-CM

## 2021-12-17 ENCOUNTER — Other Ambulatory Visit: Payer: Self-pay

## 2021-12-17 DIAGNOSIS — I350 Nonrheumatic aortic (valve) stenosis: Secondary | ICD-10-CM

## 2021-12-17 MED ORDER — METOPROLOL TARTRATE 100 MG PO TABS: ORAL_TABLET | ORAL | 0 refills | Status: DC

## 2021-12-20 ENCOUNTER — Ambulatory Visit: Payer: Medicare Other | Admitting: Cardiology

## 2021-12-20 ENCOUNTER — Encounter: Payer: Self-pay | Admitting: Cardiology

## 2021-12-20 ENCOUNTER — Other Ambulatory Visit: Payer: Self-pay

## 2021-12-20 VITALS — BP 154/80 | HR 78 | Temp 97.7°F | Resp 16 | Ht 65.0 in | Wt 152.0 lb

## 2021-12-20 DIAGNOSIS — I5032 Chronic diastolic (congestive) heart failure: Secondary | ICD-10-CM

## 2021-12-20 DIAGNOSIS — E7800 Pure hypercholesterolemia, unspecified: Secondary | ICD-10-CM | POA: Diagnosis not present

## 2021-12-20 DIAGNOSIS — I350 Nonrheumatic aortic (valve) stenosis: Secondary | ICD-10-CM | POA: Diagnosis not present

## 2021-12-20 DIAGNOSIS — I10 Essential (primary) hypertension: Secondary | ICD-10-CM | POA: Diagnosis not present

## 2021-12-20 DIAGNOSIS — I5030 Unspecified diastolic (congestive) heart failure: Secondary | ICD-10-CM | POA: Diagnosis not present

## 2021-12-20 MED ORDER — FUROSEMIDE 40 MG PO TABS: 40.0000 mg | ORAL_TABLET | Freq: Two times a day (BID) | ORAL | 2 refills | Status: DC

## 2021-12-20 NOTE — Progress Notes (Signed)
? ? ?Patient referred by Lavone Orn, MD for aortic stenosis ? ?Subjective:  ? ?Marcus Powers, male    DOB: November 29, 1927, 88 y.o.   MRN: [id number] ? ? ?Chief Complaint  ?Patient presents with  ? Hospitalization Follow-up  ? Severe aortic stenosis  ? ? ? ?HPI ? ?88 y.o. African-American male with hypertension, hyperlipidemia, symptomatic stage D1 severe AS, mod AI, HFpEF ? ?Patient was recently hospitalized at [facility] with acute decompensated heart failure.  He underwent right heart catheterization and coronary angiography, echocardiogram, that showed severe aortic stenosis, no significant coronary artery disease.  He was seen by Dr. Angelena Form and was recommended continued work-up for TAVR.  Given that Dr. Cyndia Bent is out of town for the next few weeks, this would be performed after his return, sometime in May. ? ?Patient is here today with his son. He has been doing well since discharge. Breathing is okay, leg swelling has improved. ? ?Patient's biggest complaint today was with the food he received at the hospital. He states the food was cold, he was kept hungry-separate from staying NPO for procedures.  ? ? ?Current Outpatient Medications:  ?  acetaminophen (TYLENOL) 500 MG tablet, Take 1,000 mg by mouth every 6 (six) hours as needed for mild pain., Disp: , Rfl:  ?  amLODipine (NORVASC) 5 MG tablet, Take 5 mg by mouth daily., Disp: , Rfl:  ?  furosemide (LASIX) 40 MG tablet, Take 40 mg by mouth., Disp: , Rfl:  ?  hydrochlorothiazide (HYDRODIURIL) 12.5 MG tablet, Take 12.5 mg by mouth daily., Disp: , Rfl:  ?  losartan (COZAAR) 100 MG tablet, Take 100 mg by mouth daily., Disp: , Rfl:  ?  pravastatin (PRAVACHOL) 20 MG tablet, Take 20 mg by mouth daily., Disp: , Rfl:  ? ? ? ?Cardiovascular and other pertinent studies: ? ?EKG 12/20/2021: ?Sinus tachycardia 103 bpm  ?Left atrial enlargement ?Left ventricular hypertrophy ?ST-T changes related to LVH ? ?Echocardiogram 12/13/2021: ?1. Left ventricular ejection  fraction, by estimation, is 50 to 55%. The  ?left ventricle has low normal function. The left ventricle has no regional  ?wall motion abnormalities. There is mild concentric left ventricular  ?hypertrophy. Left ventricular  ?diastolic parameters are consistent with Grade II diastolic dysfunction  ?(pseudonormalization).  ? 2. Right ventricular systolic function is normal. The right ventricular  ?size is normal. There is mildly elevated pulmonary artery systolic  ?pressure.  ? 3. Left atrial size was moderately dilated.  ? 4. The mitral valve is degenerative. No evidence of mitral valve  ?regurgitation.  ? 5. Difficult assessment for mitral stenosis, suboptimal valve morpology,  ?continuity equation valve area deferred in the setting of AI.  ? 6. The aortic valve is calcified. Aortic valve regurgitation is mild to  ?moderate. Severe aortic valve stenosis. Aortic valve mean gradient  ?measures 35.0 mmHg. Aortic valve Vmax measures 3.74 m/s. Aortic valve  ?acceleration time measures 95 msec.  ?Decreased stroke voluem index.  ? 7. The inferior vena cava is normal in size with greater than 50%  ?respiratory variability, suggesting right atrial pressure of 3 mmHg.  ? ?RHC, coronary angiography 12/13/2021: ?LM: Normal ?LAD: Prox focal 20% stenosis ?Lcx: Normal ?RCA: Mid focal 20% stenosis ?  ?No obstructive coronary artery disease ?  ?RA: 7 mmHg ?RV: 44/1 mmHg ?PA: 34/12 mmHg, mPAP 27 mmHg ?PCW: 11 mmHg ?  ?CO: 4.9 L/min ?CI: 2.8 L/min/m2 ?  ?Valvular cardiomyopathy, now compensated at rest ?NYHA class III ?  ?Okay to perform TAVR  outpatient. Continue pre-TAVR workup. ? ?Recent labs: ?12/14/2021: ?Glucose 124, BUN/Cr 15/1.26. EGFR 54. Na/K 140/3.9. Rest of the CMP normal ?H/H 12/37. MCV 97. Platelets 192 ?BNP 960 ?Trop HS 95, 102, 105 ? ?03/05/2021: ?Glucose 95, BUN/Cr 16/1.18. EGFR 70.  ?HbA1C 6.3% ?Chol 223, TG 163, HDL 76, LDL 119 ?TSH N/A ?Trop HS 91, 91 ?BNP 826 ? ? ?Review of Systems  ?Cardiovascular:  Positive for leg  swelling. Negative for chest pain, dyspnea on exertion, palpitations and syncope.  ? ?   ? ? ?Vitals:  ? 12/20/21 1115  ?BP: (!) 154/80  ?Pulse: 78  ?Resp: 16  ?SpO2: 100%  ? ? ?Body mass index is 25.29 kg/m?. Danley Danker Weights  ? 12/20/21 1115  ?Weight: 152 lb (68.9 kg)  ? ? ? ?Objective:  ? Physical Exam ?Vitals and nursing note reviewed.  ?Constitutional:   ?   General: He is not in acute distress. ?Neck:  ?   Vascular: No JVD.  ?Cardiovascular:  ?   Rate and Rhythm: Normal rate and regular rhythm.  ?   Pulses: Normal pulses.  ?   Heart sounds: Murmur heard.  ?Harsh midsystolic murmur is present with a grade of 4/6 at the upper right sternal border radiating to the neck.  ?Pulmonary:  ?   Effort: Pulmonary effort is normal.  ?   Breath sounds: Normal breath sounds. No wheezing or rales.  ?Musculoskeletal:  ?   Right lower leg: Edema (Trace) present.  ?   Left lower leg: Edema (Trace) present.  ? ? ? ?  ICD-10-CM   ?1. Severe aortic stenosis  I35.0 EKG 12-Lead  ?  ?2. Essential hypertension  I10   ?  ?3. Chronic heart failure with preserved ejection fraction (HCC)  I50.32   ?  ? ?Meds ordered this encounter  ?Medications  ? furosemide (LASIX) 40 MG tablet  ?  Sig: Take 1 tablet (40 mg total) by mouth 2 (two) times daily.  ?  Dispense:  60 tablet  ?  Refill:  2  ? ? ? ?   ? ?Assessment & Recommendations:  ? ? ?88 y.o. African-American male with hypertension, hyperlipidemia, symptomatic stage D1 severe AS, mod AI, HFpEF ? ?Severe AS, mod AI: ?Symptomatic stage D1, with heart failure symptoms. ?Ongoing TAVR work-up. CTA on 3/22.  ?Stopped HCTZ and increased lasix 40 mg bid, to avoid being on losartan, HCTZ and lasix. ? ?HFpEF: ?Fairly well compensated. ? ?Hypertension: ?BP elevated, but avoiding aggressive blood pressure control pending TAVR for severe AS. ? ?I ensured the patient that I will document his grievances re: hospital food in my chart, but encouraged him to mention these in hospital survey. ? ?F/u in 3  months ? ? ? ? ?Nigel Mormon, MD ?Pager: [phone] ?Office: [phone]  ?

## 2021-12-25 ENCOUNTER — Ambulatory Visit (HOSPITAL_COMMUNITY)
Admission: RE | Admit: 2021-12-25 | Discharge: 2021-12-25 | Disposition: A | Discharge status: Home / Self Care | Payer: Medicare Other | Source: Ambulatory Visit | Attending: Cardiovascular Disease | Admitting: Cardiovascular Disease

## 2021-12-25 ENCOUNTER — Encounter (HOSPITAL_COMMUNITY): Payer: Self-pay

## 2021-12-25 ENCOUNTER — Other Ambulatory Visit: Payer: Self-pay

## 2021-12-25 DIAGNOSIS — K5730 Diverticulosis of large intestine without perforation or abscess without bleeding: Secondary | ICD-10-CM | POA: Diagnosis not present

## 2021-12-25 DIAGNOSIS — I350 Nonrheumatic aortic (valve) stenosis: Secondary | ICD-10-CM | POA: Insufficient documentation

## 2021-12-25 DIAGNOSIS — I358 Other nonrheumatic aortic valve disorders: Secondary | ICD-10-CM | POA: Diagnosis not present

## 2021-12-25 DIAGNOSIS — I771 Stricture of artery: Secondary | ICD-10-CM | POA: Diagnosis not present

## 2021-12-25 DIAGNOSIS — I7102 Dissection of abdominal aorta: Secondary | ICD-10-CM | POA: Diagnosis not present

## 2021-12-25 DIAGNOSIS — Z01818 Encounter for other preprocedural examination: Secondary | ICD-10-CM | POA: Diagnosis not present

## 2021-12-25 MED ORDER — IOHEXOL 350 MG/ML SOLN
100.0000 mL | Freq: Once | INTRAVENOUS | Status: AC | PRN
  Administered 2021-12-25: 100 mL via INTRAVENOUS

## 2021-12-30 DIAGNOSIS — I5030 Unspecified diastolic (congestive) heart failure: Secondary | ICD-10-CM | POA: Diagnosis not present

## 2021-12-30 DIAGNOSIS — M4807 Spinal stenosis, lumbosacral region: Secondary | ICD-10-CM | POA: Diagnosis not present

## 2021-12-30 DIAGNOSIS — I10 Essential (primary) hypertension: Secondary | ICD-10-CM | POA: Diagnosis not present

## 2021-12-30 DIAGNOSIS — R0603 Acute respiratory distress: Secondary | ICD-10-CM | POA: Diagnosis not present

## 2021-12-30 DIAGNOSIS — E7800 Pure hypercholesterolemia, unspecified: Secondary | ICD-10-CM | POA: Diagnosis not present

## 2021-12-30 DIAGNOSIS — I350 Nonrheumatic aortic (valve) stenosis: Secondary | ICD-10-CM | POA: Diagnosis not present

## 2022-01-15 ENCOUNTER — Ambulatory Visit: Payer: Medicare Other

## 2022-01-19 ENCOUNTER — Emergency Department (HOSPITAL_COMMUNITY)
Admission: EM | Admit: 2022-01-19 | Discharge: 2022-01-19 | Disposition: A | Discharge status: Home / Self Care | Payer: Medicare Other | Source: Home / Self Care | Attending: Student | Admitting: Student

## 2022-01-19 ENCOUNTER — Emergency Department (HOSPITAL_COMMUNITY): Payer: Medicare Other

## 2022-01-19 DIAGNOSIS — Z888 Allergy status to other drugs, medicaments and biological substances status: Secondary | ICD-10-CM | POA: Diagnosis not present

## 2022-01-19 DIAGNOSIS — R600 Localized edema: Secondary | ICD-10-CM | POA: Insufficient documentation

## 2022-01-19 DIAGNOSIS — D649 Anemia, unspecified: Secondary | ICD-10-CM | POA: Diagnosis not present

## 2022-01-19 DIAGNOSIS — D72829 Elevated white blood cell count, unspecified: Secondary | ICD-10-CM | POA: Diagnosis not present

## 2022-01-19 DIAGNOSIS — I080 Rheumatic disorders of both mitral and aortic valves: Secondary | ICD-10-CM | POA: Diagnosis not present

## 2022-01-19 DIAGNOSIS — I509 Heart failure, unspecified: Secondary | ICD-10-CM | POA: Insufficient documentation

## 2022-01-19 DIAGNOSIS — Z823 Family history of stroke: Secondary | ICD-10-CM | POA: Diagnosis not present

## 2022-01-19 DIAGNOSIS — J9811 Atelectasis: Secondary | ICD-10-CM | POA: Diagnosis not present

## 2022-01-19 DIAGNOSIS — Z79899 Other long term (current) drug therapy: Secondary | ICD-10-CM | POA: Insufficient documentation

## 2022-01-19 DIAGNOSIS — R6889 Other general symptoms and signs: Secondary | ICD-10-CM | POA: Diagnosis not present

## 2022-01-19 DIAGNOSIS — I5A Non-ischemic myocardial injury (non-traumatic): Secondary | ICD-10-CM | POA: Diagnosis not present

## 2022-01-19 DIAGNOSIS — R0602 Shortness of breath: Secondary | ICD-10-CM | POA: Insufficient documentation

## 2022-01-19 DIAGNOSIS — I110 Hypertensive heart disease with heart failure: Secondary | ICD-10-CM | POA: Insufficient documentation

## 2022-01-19 DIAGNOSIS — R0600 Dyspnea, unspecified: Secondary | ICD-10-CM

## 2022-01-19 DIAGNOSIS — J9601 Acute respiratory failure with hypoxia: Secondary | ICD-10-CM | POA: Diagnosis not present

## 2022-01-19 DIAGNOSIS — Z87891 Personal history of nicotine dependence: Secondary | ICD-10-CM | POA: Diagnosis not present

## 2022-01-19 DIAGNOSIS — E785 Hyperlipidemia, unspecified: Secondary | ICD-10-CM | POA: Diagnosis not present

## 2022-01-19 DIAGNOSIS — Z20822 Contact with and (suspected) exposure to covid-19: Secondary | ICD-10-CM | POA: Insufficient documentation

## 2022-01-19 DIAGNOSIS — Z743 Need for continuous supervision: Secondary | ICD-10-CM | POA: Diagnosis not present

## 2022-01-19 DIAGNOSIS — I428 Other cardiomyopathies: Secondary | ICD-10-CM | POA: Diagnosis not present

## 2022-01-19 DIAGNOSIS — I5033 Acute on chronic diastolic (congestive) heart failure: Secondary | ICD-10-CM | POA: Diagnosis not present

## 2022-01-19 LAB — COMPREHENSIVE METABOLIC PANEL
ALT: 13 U/L (ref 0–44)
AST: 18 U/L (ref 15–41)
Albumin: 3.7 g/dL (ref 3.5–5.0)
Alkaline Phosphatase: 54 U/L (ref 38–126)
Anion gap: 8 (ref 5–15)
BUN: 19 mg/dL (ref 8–23)
CO2: 22 mmol/L (ref 22–32)
Calcium: 8.8 mg/dL — ABNORMAL LOW (ref 8.9–10.3)
Chloride: 106 mmol/L (ref 98–111)
Creatinine, Ser: 1.19 mg/dL (ref 0.61–1.24)
GFR, Estimated: 58 mL/min — ABNORMAL LOW (ref >60)
Glucose, Bld: 152 mg/dL — ABNORMAL HIGH (ref 70–99)
Potassium: 3.7 mmol/L (ref 3.5–5.1)
Sodium: 136 mmol/L (ref 135–145)
Total Bilirubin: 1.1 mg/dL (ref 0.3–1.2)
Total Protein: 6.4 g/dL — ABNORMAL LOW (ref 6.5–8.1)

## 2022-01-19 LAB — CBC WITH DIFFERENTIAL/PLATELET
Abs Immature Granulocytes: 0.03 10*3/uL (ref 0.00–0.07)
Basophils Absolute: 0 10*3/uL (ref 0.0–0.1)
Basophils Relative: 1 %
Eosinophils Absolute: 0 10*3/uL (ref 0.0–0.5)
Eosinophils Relative: 1 %
HCT: 34.3 % — ABNORMAL LOW (ref 39.0–52.0)
Hemoglobin: 11.3 g/dL — ABNORMAL LOW (ref 13.0–17.0)
Immature Granulocytes: 1 %
Lymphocytes Relative: 14 %
Lymphs Abs: 0.9 10*3/uL (ref 0.7–4.0)
MCH: 32.2 pg (ref 26.0–34.0)
MCHC: 32.9 g/dL (ref 30.0–36.0)
MCV: 97.7 fL (ref 80.0–100.0)
Monocytes Absolute: 0.6 10*3/uL (ref 0.1–1.0)
Monocytes Relative: 8 %
Neutro Abs: 5.1 10*3/uL (ref 1.7–7.7)
Neutrophils Relative %: 75 %
Platelets: 174 10*3/uL (ref 150–400)
RBC: 3.51 MIL/uL — ABNORMAL LOW (ref 4.22–5.81)
RDW: 13.3 % (ref 11.5–15.5)
WBC: 6.7 10*3/uL (ref 4.0–10.5)
nRBC: 0 % (ref 0.0–0.2)

## 2022-01-19 LAB — RESP PANEL BY RT-PCR (FLU A&B, COVID) ARPGX2
Influenza A by PCR: NEGATIVE
Influenza B by PCR: NEGATIVE
SARS Coronavirus 2 by RT PCR: NEGATIVE

## 2022-01-19 LAB — BRAIN NATRIURETIC PEPTIDE: B Natriuretic Peptide: 868.2 pg/mL — ABNORMAL HIGH (ref 0.0–100.0)

## 2022-01-19 LAB — TROPONIN I (HIGH SENSITIVITY): Troponin I (High Sensitivity): 54 ng/L — ABNORMAL HIGH (ref <18)

## 2022-01-19 MED ORDER — FUROSEMIDE 40 MG PO TABS: 40.0000 mg | ORAL_TABLET | Freq: Two times a day (BID) | ORAL | 2 refills | Status: DC

## 2022-01-19 NOTE — Discharge Instructions (Signed)
You were seen in the emergency department for evaluation of shortness of breath in the setting of aortic stenosis.  Your blood work and work-up today in the emergency department overall looks better than when you were previously seen last month.  I spoke extensively with your cardiology team and the cardiothoracic surgeon on-call and as your symptoms appear to have resolved, we do not believe that inpatient hospitalization will change your current medical course and will be very important that you follow-up outpatient with your CT surgeon.  However, if you experience any return of symptoms, shortness of breath, chest pain, abnormal sweating or any other concerning symptoms it is very important that you return to the emergency department for repeat evaluation. ?

## 2022-01-19 NOTE — ED Provider Notes (Signed)
?[facility] ?Provider Note ? ? ?CSN: [id number] ?Arrival date & time: 01/19/22  1414 ? ?  ? ?History ? ?No chief complaint on file. ? ? ?Ramsay Halloway is a 88 y.o. male. ? ?88 year old male with prior medical history as detailed below presents for evaluation of apparent shortness of breath.  Patient reports gradual worsening of dyspnea over the course of the last 24 hours.  Patient notes mild increasing lower extremity foot edema as well. ? ?EMS reports that the patient was quite diaphoretic upon their initial evaluation.  Patient was given supplemental 2 L nasal cannula O2.  During transport the patient became significantly more comfortable and his diaphoresis resolved. ? ?Upon arrival to the ED he is without complaint of chest pain.  He reports that his breathing is better. ? ? ?The history is provided by the patient and medical records.  ?Shortness of Breath ?Severity:  Moderate ?Onset quality:  Gradual ?Duration:  1 day ?Timing:  Constant ?Progression:  Worsening ?Chronicity:  New ? ?  ? ?Home Medications ?Prior to Admission medications   ?Medication Sig Start Date End Date Taking? Authorizing Provider  ?acetaminophen (TYLENOL) 500 MG tablet Take 1,000 mg by mouth every 6 (six) hours as needed for mild pain.    [provider]  ?amLODipine (NORVASC) 5 MG tablet Take 5 mg by mouth daily.    [provider]  ?furosemide (LASIX) 40 MG tablet Take 1 tablet (40 mg total) by mouth 2 (two) times daily. 12/20/21   Patwardhan, Reynold Bowen, MD  ?losartan (COZAAR) 100 MG tablet Take 100 mg by mouth daily. 12/31/20   [provider]  ?pravastatin (PRAVACHOL) 20 MG tablet Take 20 mg by mouth daily. 12/31/20   [provider]  ?   ? ?Allergies    ?Benazepril hcl   ? ?Review of Systems   ?Review of Systems  ?Respiratory:  Positive for shortness of breath.   ?All other systems reviewed and are negative. ? ?Physical Exam ?Updated Vital Signs ?There were no vitals  taken for this visit. ?Physical Exam ?Vitals and nursing note reviewed.  ?Constitutional:   ?   General: He is not in acute distress. ?   Appearance: Normal appearance. He is well-developed.  ?HENT:  ?   Head: Normocephalic and atraumatic.  ?Eyes:  ?   Conjunctiva/sclera: Conjunctivae normal.  ?   Pupils: Pupils are equal, round, and reactive to light.  ?Cardiovascular:  ?   Rate and Rhythm: Normal rate and regular rhythm.  ?   Heart sounds: Normal heart sounds.  ?Pulmonary:  ?   Effort: Pulmonary effort is normal. No respiratory distress.  ?   Breath sounds: Normal breath sounds.  ?Abdominal:  ?   General: There is no distension.  ?   Palpations: Abdomen is soft.  ?   Tenderness: There is no abdominal tenderness.  ?Musculoskeletal:     ?   General: No deformity. Normal range of motion.  ?   Cervical back: Normal range of motion and neck supple.  ?   Right lower leg: Edema present.  ?   Left lower leg: Edema present.  ?   Comments: Mild 1-2+ bilateral pedal edema  ?Skin: ?   General: Skin is warm and dry.  ?Neurological:  ?   General: No focal deficit present.  ?   Mental Status: He is alert and oriented to person, place, and time.  ? ? ?ED Results / Procedures / Treatments   ?Labs ?(  all labs ordered are listed, but only abnormal results are displayed) ?Labs Reviewed  ?COMPREHENSIVE METABOLIC PANEL  ?CBC WITH DIFFERENTIAL/PLATELET  ?BRAIN NATRIURETIC PEPTIDE  ?TROPONIN I (HIGH SENSITIVITY)  ? ? ?EKG ?None ? ?Radiology ?No results found. ? ?Procedures ?Procedures  ? ? ?Medications Ordered in ED ?Medications - No data to display ? ?ED Course/ Medical Decision Making/ A&P ?  ?                        ?Medical Decision Making ?Amount and/or Complexity of Data Reviewed ?Labs: ordered. ?Radiology: ordered. ? ?Risk ?Prescription drug management. ? ? ? ?Medical Screen Complete ? ?This patient presented to the ED with complaint of shortness of breath. ? ?This complaint involves an extensive number of treatment options. The  initial differential diagnosis includes, but is not limited to, just of heart failure, ACS, etc. ? ?This presentation is: Acute, Chronic, Self-Limited, Previously Undiagnosed, Uncertain Prognosis, Complicated, Systemic Symptoms, and Threat to Life/Bodily Function ?Patient is presenting with gradually worsening dyspnea over the last 24 hours. ? ?Patient reports compliance with previously prescribed medications ?EMS reported that patient was quite diaphoretic on their initial evaluation.  However on arrival, the patient appears comfortable. ? ? ?Co morbidities that complicated the patient's evaluation ? ?Chronic diastolic heart failure, moderate aortic stenosis, hypertension, hyperlipidemia ? ? ?Additional history obtained: ? ?Additional history obtained from EMS ?External records from outside sources obtained and reviewed including prior ED visits and prior Inpatient records.  ? ? ?Lab Tests: ? ?I ordered and personally interpreted labs.  The pertinent results include: CBC, BMP, troponin, BNP ? ? ?Imaging Studies ordered: ? ?I ordered imaging studies including chest x-ray ?I agree with the radiologist interpretation. ? ? ?Cardiac Monitoring: ? ?The patient was maintained on a cardiac monitor.  I personally viewed and interpreted the cardiac monitor which showed an underlying rhythm of: NSR ? ? ? ?Problem List / ED Course: ? ?Dyspnea ? ? ?Reevaluation: ? ?After the interventions noted above, I reevaluated the patient and found that they have: stayed the same ? ? ?Disposition: ? ?After consideration of the diagnostic results and the patients response to treatment, I feel that the patent would benefit from completion of ED work-up and reevaluation.  ? ? ?Case discussed and signed out to oncoming EDP. ? ? ? ? ? ? ? ?Final Clinical Impression(s) / ED Diagnoses ?Dyspnea ? ?Rx / DC Orders ?ED Discharge Orders   ? ?      Ordered  ?  furosemide (LASIX) 40 MG tablet  2 times daily       ? 01/19/22 1739  ? ?  ?  ? ?  ? ? ?   ?Valarie Merino, MD ?01/22/22 1347 ? ?

## 2022-01-19 NOTE — ED Triage Notes (Signed)
Pt here from home with shob onset yesterday, intermittent. Today noticed bil foot edema with worsening shob. Lungs CTA bilaterally. 96% RA, placed on 2L [place] for comfort. Pt diaphoretic initially, now warm and dry.  ?151/84 ?HR 100 ?100% ?

## 2022-01-19 NOTE — ED Provider Notes (Signed)
?  Physical Exam  ?BP 135/72   Pulse 80   Temp 98 ?F (36.7 ?C) (Oral)   Resp 20   SpO2 99%  ? ?Physical Exam ?Vitals and nursing note reviewed.  ?Constitutional:   ?   General: He is not in acute distress. ?   Appearance: He is well-developed.  ?HENT:  ?   Head: Normocephalic and atraumatic.  ?Eyes:  ?   Conjunctiva/sclera: Conjunctivae normal.  ?Cardiovascular:  ?   Rate and Rhythm: Normal rate and regular rhythm.  ?   Heart sounds: Murmur heard.  ?Pulmonary:  ?   Effort: Pulmonary effort is normal. No respiratory distress.  ?   Breath sounds: Normal breath sounds.  ?Abdominal:  ?   Palpations: Abdomen is soft.  ?   Tenderness: There is no abdominal tenderness.  ?Musculoskeletal:     ?   General: No swelling.  ?   Cervical back: Neck supple.  ?Skin: ?   General: Skin is warm and dry.  ?   Capillary Refill: Capillary refill takes less than 2 seconds.  ?Neurological:  ?   Mental Status: He is alert.  ?Psychiatric:     ?   Mood and Affect: Mood normal.  ? ? ?Procedures  ?Procedures ? ?ED Course / MDM  ?  ?Medical Decision Making ?Amount and/or Complexity of Data Reviewed ?Labs: ordered. ?Radiology: ordered. ? ?Risk ?Prescription drug management. ? ? ?Patient received in handoff.  Severe aortic stenosis with short episode of shortness of breath, wheezing and diaphoresis today.  His symptoms have completely resolved on my reevaluation.  Laboratory valuation with a troponin of 54 but this is downtrending from previous evaluation 1 month ago.  COVID and flu negative.  BNP 868 but also downtrending.  I consulted both cardiology and CT surgery and after extensive discussion with both consulting services, it appears that a hospital admission would not speed up the patient's TAVR and with a recent clean catheterization, the patient is likely safe for outpatient follow-up with the CT surgeons that is scheduled in 4 days despite a heart score of 5.  I had an extensive discussion with the patient and his family and they are  requesting to be discharged with return precautions which I do not feel is unreasonable at this time.  Return precautions were outlined extensively which everyone voiced understanding the patient was discharged with outpatient CT follow-up. ? ? ? ? ?  ?Glendora Score, MD ?01/19/22 1933 ? ?

## 2022-01-21 ENCOUNTER — Emergency Department (HOSPITAL_COMMUNITY): Payer: Medicare Other

## 2022-01-21 ENCOUNTER — Inpatient Hospital Stay (HOSPITAL_COMMUNITY)
Admission: EM | Admit: 2022-01-21 | Discharge: 2022-01-23 | DRG: 291 | Disposition: A | Discharge status: Home / Self Care | Payer: Medicare Other | Attending: Family Medicine | Admitting: Family Medicine

## 2022-01-21 ENCOUNTER — Encounter (HOSPITAL_COMMUNITY): Payer: Self-pay | Admitting: Internal Medicine

## 2022-01-21 ENCOUNTER — Other Ambulatory Visit: Payer: Self-pay

## 2022-01-21 DIAGNOSIS — I110 Hypertensive heart disease with heart failure: Principal | ICD-10-CM | POA: Diagnosis present

## 2022-01-21 DIAGNOSIS — D72829 Elevated white blood cell count, unspecified: Secondary | ICD-10-CM | POA: Diagnosis present

## 2022-01-21 DIAGNOSIS — I5033 Acute on chronic diastolic (congestive) heart failure: Secondary | ICD-10-CM | POA: Diagnosis present

## 2022-01-21 DIAGNOSIS — I5032 Chronic diastolic (congestive) heart failure: Secondary | ICD-10-CM | POA: Diagnosis not present

## 2022-01-21 DIAGNOSIS — Z79899 Other long term (current) drug therapy: Secondary | ICD-10-CM | POA: Diagnosis not present

## 2022-01-21 DIAGNOSIS — R0602 Shortness of breath: Principal | ICD-10-CM

## 2022-01-21 DIAGNOSIS — Z888 Allergy status to other drugs, medicaments and biological substances status: Secondary | ICD-10-CM

## 2022-01-21 DIAGNOSIS — D649 Anemia, unspecified: Secondary | ICD-10-CM

## 2022-01-21 DIAGNOSIS — Z87891 Personal history of nicotine dependence: Secondary | ICD-10-CM

## 2022-01-21 DIAGNOSIS — I428 Other cardiomyopathies: Secondary | ICD-10-CM | POA: Diagnosis present

## 2022-01-21 DIAGNOSIS — J9601 Acute respiratory failure with hypoxia: Secondary | ICD-10-CM | POA: Diagnosis present

## 2022-01-21 DIAGNOSIS — R778 Other specified abnormalities of plasma proteins: Secondary | ICD-10-CM

## 2022-01-21 DIAGNOSIS — Z20822 Contact with and (suspected) exposure to covid-19: Secondary | ICD-10-CM | POA: Diagnosis present

## 2022-01-21 DIAGNOSIS — Z743 Need for continuous supervision: Secondary | ICD-10-CM | POA: Diagnosis not present

## 2022-01-21 DIAGNOSIS — J9811 Atelectasis: Secondary | ICD-10-CM | POA: Diagnosis present

## 2022-01-21 DIAGNOSIS — I10 Essential (primary) hypertension: Secondary | ICD-10-CM | POA: Diagnosis not present

## 2022-01-21 DIAGNOSIS — I080 Rheumatic disorders of both mitral and aortic valves: Secondary | ICD-10-CM | POA: Diagnosis present

## 2022-01-21 DIAGNOSIS — I350 Nonrheumatic aortic (valve) stenosis: Secondary | ICD-10-CM | POA: Diagnosis not present

## 2022-01-21 DIAGNOSIS — I5A Non-ischemic myocardial injury (non-traumatic): Secondary | ICD-10-CM

## 2022-01-21 DIAGNOSIS — I499 Cardiac arrhythmia, unspecified: Secondary | ICD-10-CM | POA: Diagnosis not present

## 2022-01-21 DIAGNOSIS — R0689 Other abnormalities of breathing: Secondary | ICD-10-CM | POA: Diagnosis not present

## 2022-01-21 DIAGNOSIS — R0603 Acute respiratory distress: Secondary | ICD-10-CM | POA: Diagnosis present

## 2022-01-21 DIAGNOSIS — Z823 Family history of stroke: Secondary | ICD-10-CM | POA: Diagnosis not present

## 2022-01-21 DIAGNOSIS — R7989 Other specified abnormal findings of blood chemistry: Secondary | ICD-10-CM | POA: Diagnosis not present

## 2022-01-21 DIAGNOSIS — E785 Hyperlipidemia, unspecified: Secondary | ICD-10-CM | POA: Diagnosis present

## 2022-01-21 DIAGNOSIS — I352 Nonrheumatic aortic (valve) stenosis with insufficiency: Secondary | ICD-10-CM | POA: Diagnosis not present

## 2022-01-21 DIAGNOSIS — R6889 Other general symptoms and signs: Secondary | ICD-10-CM | POA: Diagnosis not present

## 2022-01-21 LAB — BRAIN NATRIURETIC PEPTIDE: B Natriuretic Peptide: 839.6 pg/mL — ABNORMAL HIGH (ref 0.0–100.0)

## 2022-01-21 LAB — URINALYSIS, ROUTINE W REFLEX MICROSCOPIC
Bilirubin Urine: NEGATIVE
Glucose, UA: NEGATIVE mg/dL
Hgb urine dipstick: NEGATIVE
Ketones, ur: NEGATIVE mg/dL
Leukocytes,Ua: NEGATIVE
Nitrite: NEGATIVE
Protein, ur: NEGATIVE mg/dL
Specific Gravity, Urine: 1.005 (ref 1.005–1.030)
pH: 5 (ref 5.0–8.0)

## 2022-01-21 LAB — BASIC METABOLIC PANEL
Anion gap: 11 (ref 5–15)
BUN: 22 mg/dL (ref 8–23)
CO2: 18 mmol/L — ABNORMAL LOW (ref 22–32)
Calcium: 8.8 mg/dL — ABNORMAL LOW (ref 8.9–10.3)
Chloride: 106 mmol/L (ref 98–111)
Creatinine, Ser: 1.28 mg/dL — ABNORMAL HIGH (ref 0.61–1.24)
GFR, Estimated: 53 mL/min — ABNORMAL LOW (ref >60)
Glucose, Bld: 252 mg/dL — ABNORMAL HIGH (ref 70–99)
Potassium: 4.1 mmol/L (ref 3.5–5.1)
Sodium: 135 mmol/L (ref 135–145)

## 2022-01-21 LAB — TROPONIN I (HIGH SENSITIVITY)
Troponin I (High Sensitivity): 113 ng/L (ref <18)
Troponin I (High Sensitivity): 231 ng/L (ref <18)

## 2022-01-21 LAB — CBC WITH DIFFERENTIAL/PLATELET
Abs Immature Granulocytes: 0.06 10*3/uL (ref 0.00–0.07)
Basophils Absolute: 0.1 10*3/uL (ref 0.0–0.1)
Basophils Relative: 0 %
Eosinophils Absolute: 0 10*3/uL (ref 0.0–0.5)
Eosinophils Relative: 0 %
HCT: 34.7 % — ABNORMAL LOW (ref 39.0–52.0)
Hemoglobin: 11.4 g/dL — ABNORMAL LOW (ref 13.0–17.0)
Immature Granulocytes: 1 %
Lymphocytes Relative: 6 %
Lymphs Abs: 0.8 10*3/uL (ref 0.7–4.0)
MCH: 32.6 pg (ref 26.0–34.0)
MCHC: 32.9 g/dL (ref 30.0–36.0)
MCV: 99.1 fL (ref 80.0–100.0)
Monocytes Absolute: 0.8 10*3/uL (ref 0.1–1.0)
Monocytes Relative: 6 %
Neutro Abs: 10.6 10*3/uL — ABNORMAL HIGH (ref 1.7–7.7)
Neutrophils Relative %: 87 %
Platelets: 189 10*3/uL (ref 150–400)
RBC: 3.5 MIL/uL — ABNORMAL LOW (ref 4.22–5.81)
RDW: 13.4 % (ref 11.5–15.5)
WBC: 12.2 10*3/uL — ABNORMAL HIGH (ref 4.0–10.5)
nRBC: 0 % (ref 0.0–0.2)

## 2022-01-21 LAB — MAGNESIUM: Magnesium: 2.1 mg/dL (ref 1.7–2.4)

## 2022-01-21 MED ORDER — VANCOMYCIN HCL 750 MG/150ML IV SOLN: 750.0000 mg | INTRAVENOUS | Status: DC

## 2022-01-21 MED ORDER — SODIUM CHLORIDE 0.9% FLUSH: 3.0000 mL | INTRAVENOUS | Status: DC | PRN

## 2022-01-21 MED ORDER — SODIUM CHLORIDE 0.9 % IV SOLN: 250.0000 mL | INTRAVENOUS | Status: DC | PRN

## 2022-01-21 MED ORDER — VANCOMYCIN HCL 1250 MG/250ML IV SOLN
1250.0000 mg | Freq: Once | INTRAVENOUS | Status: AC
  Administered 2022-01-21: 1250 mg via INTRAVENOUS
  Filled 2022-01-21: qty 250

## 2022-01-21 MED ORDER — FUROSEMIDE 10 MG/ML IJ SOLN
40.0000 mg | Freq: Two times a day (BID) | INTRAMUSCULAR | Status: DC
  Administered 2022-01-21: 40 mg via INTRAVENOUS
  Filled 2022-01-21: qty 4

## 2022-01-21 MED ORDER — PRAVASTATIN SODIUM 10 MG PO TABS
20.0000 mg | ORAL_TABLET | Freq: Every day | ORAL | Status: DC
  Administered 2022-01-21 – 2022-01-23 (×3): 20 mg via ORAL
  Filled 2022-01-21 (×3): qty 2

## 2022-01-21 MED ORDER — SODIUM CHLORIDE 0.9 % IV SOLN
2.0000 g | Freq: Once | INTRAVENOUS | Status: AC
  Administered 2022-01-21: 2 g via INTRAVENOUS
  Filled 2022-01-21: qty 12.5

## 2022-01-21 MED ORDER — SODIUM CHLORIDE 0.9% FLUSH
3.0000 mL | Freq: Two times a day (BID) | INTRAVENOUS | Status: DC
  Administered 2022-01-21 – 2022-01-23 (×3): 3 mL via INTRAVENOUS

## 2022-01-21 MED ORDER — ONDANSETRON HCL 4 MG/2ML IJ SOLN: 4.0000 mg | Freq: Four times a day (QID) | INTRAMUSCULAR | Status: DC | PRN

## 2022-01-21 MED ORDER — ACETAMINOPHEN 325 MG PO TABS: 650.0000 mg | ORAL_TABLET | ORAL | Status: DC | PRN

## 2022-01-21 MED ORDER — ENOXAPARIN SODIUM 40 MG/0.4ML IJ SOSY
40.0000 mg | PREFILLED_SYRINGE | INTRAMUSCULAR | Status: DC
  Administered 2022-01-21 – 2022-01-22 (×2): 40 mg via SUBCUTANEOUS
  Filled 2022-01-21 (×2): qty 0.4

## 2022-01-21 NOTE — Consult Note (Signed)
CARDIOLOGY CONSULT NOTE  ?Patient ID: ?Marcus Powers ?MRN: [id number] ?DOB/AGE: [birth year] 88 y.o. ? ?Admit date: 01/21/2022 ?Referring Physician: [facility] ?Reason for Consultation:  CHF ? ?HPI:  ? ?88 y.o. African-American male with hypertension, hyperlipidemia, symptomatic stage D1 severe AS, mod AI, HFpEF. ? ?Patient has been awaiting TAVR, was sent for sometime in May.  This is patient's second hospital presentation with worsening shortness of breath and negative.  He required BiPAP initially, now is on nasal cannula oxygen.  He denies other infection signs or symptoms. ? ?Past Medical History:  ?Diagnosis Date  ? Aortic stenosis   ? Chronic diastolic heart failure ([place])   ? Hyperlipidemia   ? Hypertension   ? Spinal stenosis   ?  ? ?Past Surgical History:  ?Procedure Laterality Date  ? BIOPSY BREAST    ? RIGHT HEART CATH AND CORONARY ANGIOGRAPHY N/A 12/13/2021  ? Procedure: RIGHT HEART CATH AND CORONARY ANGIOGRAPHY;  Surgeon: Nigel Mormon, MD;  Location: [facility];  Service: Cardiovascular;  Laterality: N/A;  ?  ? ? ?Family History  ?Problem Relation Age of Onset  ? Cancer Mother   ? Stroke Father   ?  ? ?Social History: ?Social History  ? ?Socioeconomic History  ? Marital status: Married  ?  Spouse name: Not on file  ? Number of children: Not on file  ? Years of education: Not on file  ? Highest education level: Not on file  ?Occupational History  ? Not on file  ?Tobacco Use  ? Smoking status: Former  ?  Packs/day: 0.25  ?  Years: 5.00  ?  Pack years: 1.25  ?  Types: Cigarettes  ?  Quit date: 1969  ?  Years since quitting: 54.3  ? Smokeless tobacco: Never  ?Vaping Use  ? Vaping Use: Never used  ?Substance and Sexual Activity  ? Alcohol use: No  ? Drug use: No  ? Sexual activity: Not on file  ?Other Topics Concern  ? Not on file  ?Social History Narrative  ? Not on file  ? ?Social Determinants of Health  ? ?Financial Resource Strain: Not on file  ?Food Insecurity: Not on file   ?Transportation Needs: Not on file  ?Physical Activity: Not on file  ?Stress: Not on file  ?Social Connections: Not on file  ?Intimate Partner Violence: Not on file  ?  ? ?(Not in a hospital admission) ? ? ?Review of Systems  ?Cardiovascular:  Positive for dyspnea on exertion and leg swelling. Negative for chest pain, palpitations and syncope.  ?  ? ?Physical Exam: ?Physical Exam ?Vitals and nursing note reviewed.  ?Constitutional:   ?   General: He is not in acute distress. ?Neck:  ?   Vascular: JVD present.  ?Cardiovascular:  ?   Rate and Rhythm: Normal rate and regular rhythm.  ?   Heart sounds: Murmur heard.  ?Harsh midsystolic murmur is present with a grade of 4/6 at the upper right sternal border radiating to the neck.  ?Pulmonary:  ?   Effort: Pulmonary effort is normal.  ?   Breath sounds: Normal breath sounds. No wheezing or rales.  ?Musculoskeletal:  ?   Right lower leg: Edema (2+) present.  ?   Left lower leg: Edema (1+) present.  ? ? ? ?  ?Imaging/tests reviewed and independently interpreted: ?Lab Results: ? Latest Reference Range & Units 01/21/22 06:55 01/21/22 09:10  ?B Natriuretic Peptide 0.0 - 100.0 pg/mL 839.6 (H)   ?Troponin I (  High Sensitivity) <18 ng/L 113 (HH) 231 (HH)  ?(HH): Data is critically high ?(H): Data is abnormally high ? ?Cardiac Studies: ? ?EKG 01/21/2022: ?Sinus tachycardia ?Left atrial enlargement ?Probable LVH with secondary repol abnrm ?Borderline prolonged QT interval ? ?Echocardiogram 12/13/2021: ? 1. Left ventricular ejection fraction, by estimation, is 50 to 55%. The  ?left ventricle has low normal function. The left ventricle has no regional  ?wall motion abnormalities. There is mild concentric left ventricular  ?hypertrophy. Left ventricular  ?diastolic parameters are consistent with Grade II diastolic dysfunction  ?(pseudonormalization).  ? 2. Right ventricular systolic function is normal. The right ventricular  ?size is normal. There is mildly elevated pulmonary artery  systolic  ?pressure.  ? 3. Left atrial size was moderately dilated.  ? 4. The mitral valve is degenerative. No evidence of mitral valve  ?regurgitation.  ? 5. Difficult assessment for mitral stenosis, suboptimal valve morpology,  ?continuity equation valve area deferred in the setting of AI.  ? 6. The aortic valve is calcified. Aortic valve regurgitation is mild to  ?moderate. Severe aortic valve stenosis. Aortic valve mean gradient  ?measures 35.0 mmHg. Aortic valve Vmax measures 3.74 m/s. Aortic valve  ?acceleration time measures 95 msec.  ?Decreased stroke voluem index.  ? 7. The inferior vena cava is normal in size with greater than 50%  ?respiratory variability, suggesting right atrial pressure of 3 mmHg.  ? ?Comparison(s): No significant change from prior study.  ? ? ?Assessment & Recommendations: ? ? ?88 y.o. African-American male with hypertension, hyperlipidemia, symptomatic stage D1 severe AS, mod AI, HFpEF. ? ?Severe AS, mod AI: ?Symptomatic stage D1, with acute decompensated HFpEF. ?Agree with hospitalization.  ?Recommend IV lasix 40 mg bid.  ?Discussed with TAVR team. Tentatively planning for TAVR next Tuesday 4/25. ?Will need to see if he can be discharged prior to that and perform outpatient TAVR or if he would need continued inpatient TAVR. He is at high risk for readmission given recurrent heart failure as soon as risk of sudden death. ? ?Troponin elevation: ?Secondary to severe AS, HFpEF. Not ACS.  ? ?Hypertension: ?Controlled ? ? ?Discussed interpretation of tests and management recommendations with the primary team ? ?  ? ?Nigel Mormon, MD ?Pager: [phone] ?Office: [phone] ? ?

## 2022-01-21 NOTE — Consult Note (Incomplete)
?[facility]   ?MULTIDISCIPLINARY HEART VALVE TEAM ? ?Cardiology Consultation:  ? ?Patient ID: Marcus Powers ?MRN: [id number]; DOB: 02-05-1928 ? ?Admit date: 01/21/2022 ?Date of Consult: 01/21/2022 ? ?Primary Care Provider: Lavone Orn, MD ?[facility] Cardiologist: Dr. Lora Havens.   ?[place] Electrophysiologist:  None  ? ? ?Patient Profile:  ? ?Marcus Powers is a 88 y.o. male with a hx of HTN, HLD, HFpEF with recurrent admissions and severe aortic stenosis with moderate AI who is being seen today for the evaluation of severe aortic stenosis and recurrent CHF at the request of Dr. Virgina Jock. ? ?History of Present Illness:  ? ?Marcus Powers is a very nice 88yo male who lives here in [place] with his wife of 14 years, their daughter and her husband. He is a retired Database administrator. He is very functional at home and is able to perform ADL/IADLs on his own. He ambulates in the home with a cane and uses a walker for longer distances, mainly for stability. He continues to mow his yard with a riding lawnmower without difficulty. He does not go to the dentist however has no active issues with his teeth. ? ?Marcus Powers has have had severe aortic stenosis dating back to May 2022 when he was admitted for acute heart failure and echocardiogram showed EF 50 to 55% with severe aortic stenosis with a mean gradient of 47.2 mmHg. ? ?He had outpatient follow-up with Dr. Virgina Jock and the subject of TAVR was brought up several times but the patient was reluctant to pursue further work-up.  ? ?He was then admitted in 12/2021 for acute CHF. TTE at that time showed EF 50 to 55% with mild LVH, grade 2 diastolic dysfunction, severe aortic stenosis with a mean gradient of 35 mmHg, peak gradient of 56 mmHg, aortic valve area of 0.33 cm?, DVI 0.18 as well as mild to moderate aortic valve regurgitation with a pressure half-time of 315 ms.  He underwent left and right heart catheterization during this admission which  showed no obstructive CAD.  He was evaluated by the multidisciplinary valve team and felt to be a good candidate for TAVR.  He underwent CT scanning which showed anatomy amendable to TAVR with TF access.  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 This was reviewed by the valve team and we felt like it would not prohibit TF access.  ? ?He was scheduled for outpatient cardiothoracic surgery evaluation with Dr. Cyndia Bent on 01/23/2022.  However, he was seen in the ER on 01/19/2022 for evaluation of worsening dyspnea on exertion. He was ultimately discharged home with lasix.  He then represented to the ER today for wheezing, acute shortness of breath and hypoxia requiring BiPAP.  Labs were remarkable for BNP of 840, high-sensitivity troponin 113-->231, WBC 12.2, hemoglobin 11.4, platelets 189, creatinine 1.28, potassium 4.1, sodium 135, chest x-ray with bibasilar atelectasis or infiltrate. He was seen by Dr. Virgina Jock who felt he had acute on chronic CHF requiring IV diuresis. He has been admitted and we will follow his hospital course. ? ? ? ?Past Medical History:  ?Diagnosis Date  ? Aortic stenosis   ? Chronic diastolic heart failure ([place])   ? Hyperlipidemia   ? Hypertension   ? Spinal stenosis   ? ? ?Past Surgical History:  ?Procedure Laterality Date  ? BIOPSY BREAST    ? RIGHT HEART CATH AND CORONARY ANGIOGRAPHY N/A 12/13/2021  ? Procedure: RIGHT HEART CATH AND  CORONARY ANGIOGRAPHY;  Surgeon: Elder Negus, MD;  Location: [facility];  Service: Cardiovascular;  Laterality: N/A;  ?  ? ?Home Medications:  ?Prior to Admission medications   ?Medication Sig Start Date End Date Taking? Authorizing Provider  ?acetaminophen (TYLENOL) 650 MG CR tablet Take 650 mg by mouth daily.   Yes [provider]  ?furosemide (LASIX) 40 MG tablet Take 1 tablet (40 mg total) by mouth 2 (two) times daily. ?Patient taking differently: Take 40 mg by  mouth daily. 01/19/22  Yes Kommor, Madison, MD  ?losartan (COZAAR) 100 MG tablet Take 100 mg by mouth daily. 12/31/20  Yes [provider]  ?pravastatin (PRAVACHOL) 20 MG tablet Take 20 mg by mouth daily. 12/31/20  Yes [provider]  ?Pseudoeph-Doxylamine-DM-APAP (NYQUIL PO) Take 1 Dose by mouth daily as needed (cough).   Yes [provider]  ?amLODipine (NORVASC) 5 MG tablet Take 5 mg by mouth daily.    [provider]  ? ? ?Inpatient Medications: ?Scheduled Meds: ? enoxaparin (LOVENOX) injection  40 mg Subcutaneous Q24H  ? furosemide  40 mg Intravenous BID  ? pravastatin  20 mg Oral Daily  ? sodium chloride flush  3 mL Intravenous Q12H  ? ?Continuous Infusions: ? sodium chloride    ? ?PRN Meds: ?sodium chloride, acetaminophen, ondansetron (ZOFRAN) IV, sodium chloride flush ? ?Allergies:    ?Allergies  ?Allergen Reactions  ? Benazepril Hcl Cough  ? ? ?Social History:   ?Social History  ? ?Socioeconomic History  ? Marital status: Married  ?  Spouse name: Not on file  ? Number of children: Not on file  ? Years of education: Not on file  ? Highest education level: Not on file  ?Occupational History  ? Not on file  ?Tobacco Use  ? Smoking status: Former  ?  Packs/day: 0.25  ?  Years: 5.00  ?  Pack years: 1.25  ?  Types: Cigarettes  ?  Quit date: 1969  ?  Years since quitting: 54.3  ? Smokeless tobacco: Never  ?Vaping Use  ? Vaping Use: Never used  ?Substance and Sexual Activity  ? Alcohol use: No  ? Drug use: No  ? Sexual activity: Not on file  ?Other Topics Concern  ? Not on file  ?Social History Narrative  ? Not on file  ? ?Social Determinants of Health  ? ?Financial Resource Strain: Not on file  ?Food Insecurity: Not on file  ?Transportation Needs: Not on file  ?Physical Activity: Not on file  ?Stress: Not on file  ?Social Connections: Not on file  ?Intimate Partner Violence: Not on file  ?  ?Family History:   ?Family History  ?Problem Relation Age of Onset  ? Cancer Mother   ?  Stroke Father   ?  ? ?ROS:  ?Please see the history of present illness.  ?All other ROS reviewed and negative.    ? ?Physical Exam/Data:  ? ?Vitals:  ? 01/21/22 1200 01/21/22 1300 01/21/22 1400 01/21/22 1500  ?BP: (!) 158/83 (!) 147/74 119/69 135/77  ?Pulse: 99 97 92 91  ?Resp: 16 19 16 20   ?Temp:      ?TempSrc:      ?SpO2: 100% 98% 100% 100%  ?Weight:      ?Height:      ? ? ?Intake/Output Summary (Last 24 hours) at 01/21/2022 1529 ?Last data filed at 01/21/2022 1349 ?Gross per 24 hour  ?Intake 651.4 ml  ?Output 800 ml  ?Net -148.6 ml  ? ? ?  01/21/2022  ?  6:31 AM 12/20/2021  ? 11:15 AM 12/14/2021  ?  4:45 AM  ?Last 3 Weights  ?Weight (lbs) 151 lb 14.4 oz 152 lb 147 lb 14.9 oz  ?Weight (kg) 68.9 kg 68.947 kg 67.1 kg  ?   ?Body mass index is 25.28 kg/m?.  ?General:  Well nourished, well developed, in no acute distress ?HEENT: normal ?Lymph: no adenopathy ?Neck: no JVD ?Cardiac:  normal S1, S2; RRR; 3/6 SEM.  ?Lungs: diffuse wheezing ?Abd: soft, nontender, no hepatomegaly  ?Ext: no edema ?Musculoskeletal:  No deformities, BUE and BLE strength normal and equal ?Skin: warm and dry  ?Neuro:  CNs 2-12 intact, no focal abnormalities noted ?Psych:  Normal affect  ? ?EKG:  The EKG was personally reviewed and demonstrates:  sinus tachy with LVH with repol abnormality. HR 121 ?Telemetry:  Telemetry was personally reviewed and demonstrates:  sinus  ? ?Relevant CV Studies: ? ?2D ECHO 12/13/21 ?IMPRESSIONS  ? 1. Left ventricular ejection fraction, by estimation, is 50 to 55%. The  ?left ventricle has low normal function. The left ventricle has no regional  ?wall motion abnormalities. There is mild concentric left ventricular  ?hypertrophy. Left ventricular  ?diastolic parameters are consistent with Grade II diastolic dysfunction  ?(pseudonormalization).  ? 2. Right ventricular systolic function is normal. The right ventricular  ?size is normal. There is mildly elevated pulmonary artery systolic  ?pressure.  ? 3. Left atrial size was  moderately dilated.  ? 4. The mitral valve is degenerative. No evidence of mitral valve  ?regurgitation.  ? 5. Difficult assessment for mitral stenosis, suboptimal valve morpology,  ?continuity equation valve area

## 2022-01-21 NOTE — H&P (Signed)
?History and Physical  ? ? ?Marcus Powers [id number] DOB: 10/15/1927 DOA: 01/21/2022 ? ?PCP: Kirby Funk, MD  ?Patient coming from: Home ? ?I have personally briefly reviewed patient's old medical records in [facility] ? ?Chief Complaint: Worsening shortness of breath on minimal exertion. ? ?HPI: Marcus Powers is a 88 y.o. male with medical history significant of hypertension, hyperlipidemia, severe aortic stenosis, moderate aortic insufficiency, chronic diastolic heart failure, spinal stenosis who had been awaiting TAVR to be done sometime in May presenting to the ED with worsening shortness of breath.  Patient recently seen in the ED on 01/19/2022 with similar presentation of worsening shortness of breath, noted to have a flash pulmonary edema, given diuretics improved in the ED, CT surgery and cardiology consulted by ED physician at that time and as patient improved clinically patient was discharged home on diuretics with close outpatient follow-up with CT surgery on 01/23/2022 and outpatient follow-up with cardiology. ?Patient presented back to the ED with worsening shortness of breath on minimal exertion.  Patient stated had been compliant with diuretics.  Patient denies any chest pain, no syncope, no fever, no chills, no nausea, no vomiting, no abdominal pain, no diarrhea, no melena, no hematemesis, no hematochezia.  Patient does report an episode of constipation which has since resolved. ?Patient noted on presentation to the ED via EMS noted to have sats in the mid 80s on room air and placed on a CPAP.  On presentation to the ED patient subsequently transitioned to a BiPAP.  CT surgery consulted by ED physician.  Hospitalist called to admit the patient for further evaluation and management ? ?ED Course: Patient seen in the ED noted to be in acute hypoxic respiratory failure requiring BiPAP initially and now on nasal cannula.  Patient also noted to ED to be using accessory muscles of respiration at that  time.  Patient noted to be tachycardic on presentation.  Basic metabolic profile done with a bicarb of 18, glucose of 252, creatinine of 1.28, calcium of 8.8 otherwise was within normal limits.  BNP noted at 839.6.  Troponin I was 113>>> 231.  CBC done with a white count of 12.2, hemoglobin of 11.4 otherwise was within normal limits.  EKG with a sinus tachycardia with LVH.  Urinalysis done nitrite negative, leukocytes negative otherwise within normal limits.  Chest x-ray done with similar appearance of bibasilar atelectasis versus infiltrate.  Patient placed on IV vancomycin, IV cefepime in the ED due to concerns for possible community-acquired pneumonia.  ED PA consulted with CT surgery.  ? ?Review of Systems: As per HPI otherwise all other systems reviewed and are negative. ? ?Past Medical History:  ?Diagnosis Date  ? Aortic stenosis   ? Chronic diastolic heart failure ([facility])   ? Hyperlipidemia   ? Hypertension   ? Spinal stenosis   ? ? ?Past Surgical History:  ?Procedure Laterality Date  ? BIOPSY BREAST    ? RIGHT HEART CATH AND CORONARY ANGIOGRAPHY N/A 12/13/2021  ? Procedure: RIGHT HEART CATH AND CORONARY ANGIOGRAPHY;  Surgeon: Elder Negus, MD;  Location: [facility];  Service: Cardiovascular;  Laterality: N/A;  ? ? ?Social History ? reports that he quit smoking about 54 years ago. His smoking use included cigarettes. He has a 1.25 pack-year smoking history. He has never used smokeless tobacco. He reports that he does not drink alcohol and does not use drugs. ? ?Allergies  ?Allergen Reactions  ? Benazepril Hcl Cough  ? ? ?Family History  ?Problem  Relation Age of Onset  ? Cancer Mother   ? Stroke Father   ? ?Mother deceased from cancer.  Father deceased from CVA. ? ?Prior to Admission medications   ?Medication Sig Start Date End Date Taking? Authorizing Provider  ?acetaminophen (TYLENOL) 500 MG tablet Take 1,000 mg by mouth every 6 (six) hours as needed for mild pain.    [provider]   ?amLODipine (NORVASC) 5 MG tablet Take 5 mg by mouth daily.    [provider]  ?furosemide (LASIX) 40 MG tablet Take 1 tablet (40 mg total) by mouth 2 (two) times daily. 01/19/22   Kommor, Madison, MD  ?losartan (COZAAR) 100 MG tablet Take 100 mg by mouth daily. 12/31/20   [provider]  ?pravastatin (PRAVACHOL) 20 MG tablet Take 20 mg by mouth daily. 12/31/20   [provider]  ? ? ?Physical Exam: ?Vitals:  ? 01/21/22 1200 01/21/22 1300 01/21/22 1400 01/21/22 1500  ?BP: (!) 158/83 (!) 147/74 119/69 135/77  ?Pulse: 99 97 92 91  ?Resp: 16 19 16 20   ?Temp:      ?TempSrc:      ?SpO2: 100% 98% 100% 100%  ?Weight:      ?Height:      ? ? ?Constitutional: NAD, calm, comfortable ?Vitals:  ? 01/21/22 1200 01/21/22 1300 01/21/22 1400 01/21/22 1500  ?BP: (!) 158/83 (!) 147/74 119/69 135/77  ?Pulse: 99 97 92 91  ?Resp: 16 19 16 20   ?Temp:      ?TempSrc:      ?SpO2: 100% 98% 100% 100%  ?Weight:      ?Height:      ? ?Eyes: PERRL, lids and conjunctivae normal ?ENMT: Mucous membranes are moist. Posterior pharynx clear of any exudate or lesions.Normal dentition.  ?Neck: normal, supple, no masses, no thyromegaly ?Respiratory: Some bibasilar crackles.  No wheezing.  Fair air movement.  No use of accessory muscles of respiration.  Speaking in full sentences.  ?Cardiovascular: Regular rate and rhythm with 3/6 systolic ejection murmur in the right upper sternal border radiating to the neck, no rubs / gallops.+JVD.  1-2+ bilateral lower extremity edema. ?Abdomen: no tenderness, no masses palpated. No hepatosplenomegaly. Bowel sounds positive.  ?Musculoskeletal: no clubbing / cyanosis. No joint deformity upper and lower extremities. Good ROM, no contractures. Normal muscle tone.  ?Skin: no rashes, lesions, ulcers. No induration ?Neurologic: CN 2-12 grossly intact. Sensation intact, DTR normal. Strength 5/5 in all 4.  ?Psychiatric: Normal judgment and insight. Alert and oriented x 3. Normal mood.  ? ?Labs on  Admission: I have personally reviewed following labs and imaging studies ? ?CBC: ?Recent Labs  ?Lab 01/19/22 ?1426 01/21/22 ?0655  ?WBC 6.7 12.2*  ?NEUTROABS 5.1 10.6*  ?HGB 11.3* 11.4*  ?HCT 34.3* 34.7*  ?MCV 97.7 99.1  ?PLT 174 189  ? ? ?Basic Metabolic Panel: ?Recent Labs  ?Lab 01/19/22 ?1426 01/21/22 ?0655  ?NA 136 135  ?K 3.7 4.1  ?CL 106 106  ?CO2 22 18*  ?GLUCOSE 152* 252*  ?BUN 19 22  ?CREATININE 1.19 1.28*  ?CALCIUM 8.8* 8.8*  ? ? ?GFR: ?Estimated Creatinine Clearance: 32.7 mL/min (A) (by C-G formula based on SCr of 1.28 mg/dL (H)). ? ?Liver Function Tests: ?Recent Labs  ?Lab 01/19/22 ?1426  ?AST 18  ?ALT 13  ?ALKPHOS 54  ?BILITOT 1.1  ?PROT 6.4*  ?ALBUMIN 3.7  ? ? ?Urine analysis: ?   ?Component Value Date/Time  ? COLORURINE COLORLESS (A) 01/21/2022 1135  ? APPEARANCEUR CLEAR 01/21/2022 1135  ?  LABSPEC 1.005 01/21/2022 1135  ? PHURINE 5.0 01/21/2022 1135  ? GLUCOSEU NEGATIVE 01/21/2022 1135  ? HGBUR NEGATIVE 01/21/2022 1135  ? BILIRUBINUR NEGATIVE 01/21/2022 1135  ? KETONESUR NEGATIVE 01/21/2022 1135  ? PROTEINUR NEGATIVE 01/21/2022 1135  ? NITRITE NEGATIVE 01/21/2022 1135  ? LEUKOCYTESUR NEGATIVE 01/21/2022 1135  ? ? ?Radiological Exams on Admission: ?DG Chest Port 1 View ? ?Result Date: 01/21/2022 ?CLINICAL DATA:  Shortness of breath. EXAM: PORTABLE CHEST 1 VIEW COMPARISON:  01/19/2022 FINDINGS: 0700 hours. The cardio pericardial silhouette is enlarged. Similar appearance bibasilar atelectasis or infiltrate. Degenerative changes noted both shoulders. Telemetry leads overlie the chest. IMPRESSION: Similar appearance of bibasilar atelectasis or infiltrate. Electronically Signed   By: Kennith Center M.D.   On: 01/21/2022 07:24   ? ?EKG: Independently reviewed.  Sinus tachycardia/LVH ? ?Assessment/Plan ?Principal Problem: ?  Acute respiratory failure with hypoxia (HCC) ?Active Problems: ?  Symptomatic severe aortic stenosis with normal ejection fraction ?  Severe aortic stenosis ?  Essential hypertension ?   Acute on chronic diastolic heart failure (HCC) ?  Hyperlipidemia ?  Elevated troponin ?  Leukocytosis ?  ? ?#1 acute respiratory failure with hypoxia secondary to symptomatic severe aortic stenosis/acute on c

## 2022-01-21 NOTE — Progress Notes (Signed)
Heart Failure Navigator Progress Note ? ?Assessed for Heart & Vascular TOC clinic readiness.  ?Patient does not meet criteria due to Patient of [facility] Cardiology. .  ? ? ? ?Rhae Hammock, BSN, RN ?Heart Failure Nurse Navigator ?Secure Chat Only   ?

## 2022-01-21 NOTE — ED Provider Notes (Signed)
?Marcus Powers ?Provider Note ? ? ?CSN: [id number] ?Arrival date & time: 01/21/22  [id number] ? ?  ? ?History ? ?Chief Complaint  ?Patient presents with  ? Shortness of Breath  ?  Pt reports increased shortness of breath. Was seen at the hospital 2 days ago due to fluid and was dc with lasix.pt brought in with cpap  ? ? ?Marcus Powers is a 88 y.o. male with a medical hx of chronic diastolic HF, severe aortic stenosis who presents to the Emergency Department brought in by EMS complaining of SOB onset today PTA.  Patient was brought in via EMS on CPAP due to increased shortness of breath and hypoxia to mid 80s on room air.  Patient's hypoxia was still present on a nonrebreather and he was ultimately transitioned to a CPAP. Patient notes that he felt well after he was discharged from the emergency department on 01/18/2022 however notes that this morning he started to experience shortness of breath.  His wife notes that since he was discharged from the hospital he has had increased fatigue and wheezing.  He has been taking his Lasix as prescribed.  Denies chest pain, abdominal pain, nausea, vomiting, back pain. ? ?The history is provided by the patient. No language interpreter was used.  ? ?  ? ?Home Medications ?Prior to Admission medications   ?Medication Sig Start Date End Date Taking? Authorizing Provider  ?acetaminophen (TYLENOL) 500 MG tablet Take 1,000 mg by mouth every 6 (six) hours as needed for mild pain.    [provider]  ?amLODipine (NORVASC) 5 MG tablet Take 5 mg by mouth daily.    [provider]  ?furosemide (LASIX) 40 MG tablet Take 1 tablet (40 mg total) by mouth 2 (two) times daily. 01/19/22   Kommor, [place], MD  ?losartan (COZAAR) 100 MG tablet Take 100 mg by mouth daily. 12/31/20   [provider]  ?pravastatin (PRAVACHOL) 20 MG tablet Take 20 mg by mouth daily. 12/31/20   [provider]  ?   ? ?Allergies    ?Benazepril hcl   ? ?Review  of Systems   ?Review of Systems  ?Respiratory:  Positive for shortness of breath.   ?Cardiovascular:  Negative for chest pain.  ?Gastrointestinal:  Negative for abdominal pain, nausea and vomiting.  ?Musculoskeletal:  Negative for back pain.  ?All other systems reviewed and are negative. ? ?Physical Exam ?Updated Vital Signs ?BP 136/83   Pulse 91   Temp 98.9 ?F (37.2 ?C) (Rectal)   Resp 20   Ht 5\' 5"  (1.651 m)   Wt 68.9 kg   SpO2 100%   BMI 25.28 kg/m?  ?Physical Exam ?Vitals and nursing note reviewed.  ?Constitutional:   ?   General: He is not in acute distress. ?   Appearance: He is not diaphoretic.  ?   Comments: BiPAP in place  ?HENT:  ?   Head: Normocephalic and atraumatic.  ?   Mouth/Throat:  ?   Pharynx: No oropharyngeal exudate.  ?Eyes:  ?   General: No scleral icterus. ?   Conjunctiva/sclera: Conjunctivae normal.  ?Cardiovascular:  ?   Rate and Rhythm: Normal rate and regular rhythm.  ?   Pulses: Normal pulses.  ?   Heart sounds: Normal heart sounds.  ?Pulmonary:  ?   Effort: Accessory muscle usage, prolonged expiration and retractions present. No respiratory distress.  ?   Breath sounds: Rales present. No wheezing.  ?   Comments: Prolonged expiration, retractions, accessory  muscle usage noted on initial physical examination. ?Abdominal:  ?   General: Bowel sounds are normal.  ?   Palpations: Abdomen is soft. There is no mass.  ?   Tenderness: There is no abdominal tenderness. There is no guarding or rebound.  ?Musculoskeletal:     ?   General: Normal range of motion.  ?   Cervical back: Normal range of motion and neck supple.  ?   Comments: Trace to 1+ pitting edema noted to bilateral lower extremities.  ?Skin: ?   General: Skin is warm and dry.  ?Neurological:  ?   Mental Status: He is alert.  ?Psychiatric:     ?   Behavior: Behavior normal.  ? ? ?ED Results / Procedures / Treatments   ?Labs ?(all labs ordered are listed, but only abnormal results are displayed) ?Labs Reviewed  ?BASIC METABOLIC  PANEL - Abnormal; Notable for the following components:  ?    Result Value  ? CO2 18 (*)   ? Glucose, Bld 252 (*)   ? Creatinine, Ser 1.28 (*)   ? Calcium 8.8 (*)   ? GFR, Estimated 53 (*)   ? All other components within normal limits  ?CBC WITH DIFFERENTIAL/PLATELET - Abnormal; Notable for the following components:  ? WBC 12.2 (*)   ? RBC 3.50 (*)   ? Hemoglobin 11.4 (*)   ? HCT 34.7 (*)   ? Neutro Abs 10.6 (*)   ? All other components within normal limits  ?BRAIN NATRIURETIC PEPTIDE - Abnormal; Notable for the following components:  ? B Natriuretic Peptide 839.6 (*)   ? All other components within normal limits  ?TROPONIN I (HIGH SENSITIVITY) - Abnormal; Notable for the following components:  ? Troponin I (High Sensitivity) 113 (*)   ? All other components within normal limits  ?TROPONIN I (HIGH SENSITIVITY) - Abnormal; Notable for the following components:  ? Troponin I (High Sensitivity) 231 (*)   ? All other components within normal limits  ?URINE CULTURE  ?URINALYSIS, ROUTINE W REFLEX MICROSCOPIC  ? ? ?EKG ?EKG Interpretation ? ?Date/Time:  Tuesday January 21 2022 06:29:37 EDT ?Ventricular Rate:  121 ?PR Interval:  156 ?QRS Duration: 86 ?QT Interval:  336 ?QTC Calculation: 477 ?R Axis:   63 ?Text Interpretation: Sinus tachycardia Left atrial enlargement Probable LVH with secondary repol abnrm Borderline prolonged QT interval Confirmed by Merrily Pew [phone]) on 01/21/2022 6:36:10 AM ? ?Radiology ?DG Chest Port 1 View ? ?Result Date: 01/21/2022 ?CLINICAL DATA:  Shortness of breath. EXAM: PORTABLE CHEST 1 VIEW COMPARISON:  01/19/2022 FINDINGS: 0700 hours. The cardio pericardial silhouette is enlarged. Similar appearance bibasilar atelectasis or infiltrate. Degenerative changes noted both shoulders. Telemetry leads overlie the chest. IMPRESSION: Similar appearance of bibasilar atelectasis or infiltrate. Electronically Signed   By: Misty Stanley M.D.   On: 01/21/2022 07:24  ? ?DG Chest Port 1 View ? ?Result Date:  01/19/2022 ?CLINICAL DATA:  Shortness of breath EXAM: PORTABLE CHEST 1 VIEW COMPARISON:  Radiograph 12/12/2021 FINDINGS: Unchanged cardiomediastinal silhouette. There are mild interstitial opacities, right greater than left and bibasilar opacities. No large pleural effusion. No visible pneumothorax. Right glenohumeral osteoarthritis. No acute osseous abnormality. IMPRESSION: Asymmetric right mild interstitial opacities and patchy bibasilar opacities could reflect mild asymmetric pulmonary edema. Electronically Signed   By: Maurine Simmering M.D.   On: 01/19/2022 15:12   ? ?Procedures ?Procedures  ? ? ?Medications Ordered in ED ?Medications - No data to display ? ?ED Course/ Medical Decision Making/ A&P ?Clinical Course  as of 01/21/22 1202  ?Tue Jan 21, 2022  ?0746 Pt observed to be resting comfortably and asleep on stretcher.  No visible retractions or accessory muscle usage noted. [SB]  ?0748 WBC(!): 12.2 [SB]  ?0802 98.9 rectal temp [SB]  ?0802 Discussed with wife treatment plan and answered all available questions. [SB]  ?0901 Troponin I (High Sensitivity)(!!): 113 [SB]  ?0901 B Natriuretic Peptide(!): 839.6 ?Improved from previous value of 868.2 on 01/19/22 [SB]  ?0949 Discussed with patient daughter and patient in length regarding and imaging findings.  Daughter notes that the patient had an opportunity last year to have TAVR completed however at that time he declined.  Answered all available questions. [SB]  ?[place] with cardiothoracic surgery RN, Thurmond Butts who noted that they noted that they will contact Dr. Cyndia Bent and secure chat recommendations.  [SB]  ?0959 Troponin I (High Sensitivity)(!!): 231 [SB]  ?[id number] Consult with Dr. Grandville Silos who recommends lasix, UA and culture, cards consult [SB]  ?1107 In-depth detailed conversation with patient and family members at bedside regarding hospital admission.  Answered all available questions. [SB]  ?[place] with cardiology, upon phone call with cardiology PA, noted  that consult was placed to [facility] instead of [place].  Upon review of chart, noted [place] cardiologist Dr. Virgina Jock with note pended in the chart. [SB]  ?  ?Clinical Course User Index ?[SB] Senie Lanese A, PA-C  ?

## 2022-01-21 NOTE — Progress Notes (Signed)
Pharmacy Antibiotic Note ? ?Marcus Powers is a 88 y.o. male admitted on 01/21/2022 with pneumonia.  Pharmacy has been consulted for vancomycin dosing. Also with cefepime x 1 dose ordered per EDP. SCr 1.28 on presentation. ? ?Plan: ?Vancomycin 1250mg  IV x 1; then 750mg  IV q24h. Goal AUC 400-550. ?Expected AUC: 479 ?SCr used: 1.28 ?Cefepime 2g IV x 1 - f/u if admitting MD plans to continue ?Monitor clinical progress, c/s, renal function ?F/u de-escalation plan/LOT, vancomycin levels as indicated ? ? ?Height: 5\' 5"  (165.1 cm) ?Weight: 68.9 kg (151 lb 14.4 oz) ?IBW/kg (Calculated) : 61.5 ? ?Temp (24hrs), Avg:98.9 ?F (37.2 ?C), Min:98.9 ?F (37.2 ?C), Max:98.9 ?F (37.2 ?C) ? ?Recent Labs  ?Lab 01/19/22 ?1426 01/21/22 ?0655  ?WBC 6.7 12.2*  ?CREATININE 1.19 1.28*  ?  ?Estimated Creatinine Clearance: 32.7 mL/min (A) (by C-G formula based on SCr of 1.28 mg/dL (H)).   ? ?Allergies  ?Allergen Reactions  ? Benazepril Hcl Cough  ? ? ? , PharmD, BCPS ?Please check AMION for all [facility] contact numbers ?Clinical Pharmacist ?01/21/2022 10:32 AM ? ?

## 2022-01-22 ENCOUNTER — Other Ambulatory Visit: Payer: Self-pay

## 2022-01-22 DIAGNOSIS — I350 Nonrheumatic aortic (valve) stenosis: Secondary | ICD-10-CM

## 2022-01-22 DIAGNOSIS — I10 Essential (primary) hypertension: Secondary | ICD-10-CM

## 2022-01-22 DIAGNOSIS — D649 Anemia, unspecified: Secondary | ICD-10-CM

## 2022-01-22 DIAGNOSIS — R0603 Acute respiratory distress: Secondary | ICD-10-CM

## 2022-01-22 DIAGNOSIS — I5032 Chronic diastolic (congestive) heart failure: Secondary | ICD-10-CM

## 2022-01-22 LAB — CBC WITH DIFFERENTIAL/PLATELET
Abs Immature Granulocytes: 0.02 10*3/uL (ref 0.00–0.07)
Basophils Absolute: 0.1 10*3/uL (ref 0.0–0.1)
Basophils Relative: 1 %
Eosinophils Absolute: 0.1 10*3/uL (ref 0.0–0.5)
Eosinophils Relative: 1 %
HCT: 32.3 % — ABNORMAL LOW (ref 39.0–52.0)
Hemoglobin: 10.5 g/dL — ABNORMAL LOW (ref 13.0–17.0)
Immature Granulocytes: 0 %
Lymphocytes Relative: 17 %
Lymphs Abs: 1.3 10*3/uL (ref 0.7–4.0)
MCH: 31.8 pg (ref 26.0–34.0)
MCHC: 32.5 g/dL (ref 30.0–36.0)
MCV: 97.9 fL (ref 80.0–100.0)
Monocytes Absolute: 0.9 10*3/uL (ref 0.1–1.0)
Monocytes Relative: 12 %
Neutro Abs: 5.2 10*3/uL (ref 1.7–7.7)
Neutrophils Relative %: 69 %
Platelets: 164 10*3/uL (ref 150–400)
RBC: 3.3 MIL/uL — ABNORMAL LOW (ref 4.22–5.81)
RDW: 13.4 % (ref 11.5–15.5)
WBC: 7.5 10*3/uL (ref 4.0–10.5)
nRBC: 0 % (ref 0.0–0.2)

## 2022-01-22 LAB — URINE CULTURE: Culture: NO GROWTH

## 2022-01-22 LAB — BASIC METABOLIC PANEL
Anion gap: 5 (ref 5–15)
BUN: 18 mg/dL (ref 8–23)
CO2: 27 mmol/L (ref 22–32)
Calcium: 8.8 mg/dL — ABNORMAL LOW (ref 8.9–10.3)
Chloride: 105 mmol/L (ref 98–111)
Creatinine, Ser: 1.26 mg/dL — ABNORMAL HIGH (ref 0.61–1.24)
GFR, Estimated: 54 mL/min — ABNORMAL LOW (ref >60)
Glucose, Bld: 124 mg/dL — ABNORMAL HIGH (ref 70–99)
Potassium: 3.7 mmol/L (ref 3.5–5.1)
Sodium: 137 mmol/L (ref 135–145)

## 2022-01-22 LAB — BRAIN NATRIURETIC PEPTIDE: B Natriuretic Peptide: 799.1 pg/mL — ABNORMAL HIGH (ref 0.0–100.0)

## 2022-01-22 MED ORDER — TORSEMIDE 20 MG PO TABS
20.0000 mg | ORAL_TABLET | Freq: Two times a day (BID) | ORAL | Status: DC
Start: 2022-01-22 — End: 2022-01-23
  Administered 2022-01-22 – 2022-01-23 (×2): 20 mg via ORAL
  Filled 2022-01-22 (×2): qty 1

## 2022-01-22 NOTE — Assessment & Plan Note (Signed)
Hgb stable relative to baseline 

## 2022-01-22 NOTE — Assessment & Plan Note (Signed)
Acute respiratory failure ruled out. ?Patient had flash pulmonary edema, some transient hypoxemia, with distress, but this improved rapidly with Lasix in the ER. ?

## 2022-01-22 NOTE — Assessment & Plan Note (Signed)
BP soft this morning ?-Hold amlodipine, losartan ?

## 2022-01-22 NOTE — Hospital Course (Signed)
Marcus Powers is a 88 y.o. M with severe AS in work up for TAVR, HTN, HLD, dCHF, and spinal stenosis who presented again for SOB. ? ?2 days prior to this admission, developed acute SOB, came to the ER, found to have pulmonary edema on CXR, given diuretics and improved, and was discharged home on oral diuretics.   ? ?Patient returned with acute recurrence of SOB with any exertion despite compliance with diuretics.  In the ER, hypoxic to 80s and working to breathe, requiring BiPAP. ?

## 2022-01-22 NOTE — Evaluation (Signed)
Occupational Therapy Evaluation ?Patient Details ?Name: Marcus Powers ?MRN: [id number] ?DOB: 12-Mar-1928 ?Today's Date: 01/22/2022 ? ? ?History of Present Illness Pt is a 88 y.o. M who presents 4/18 with  acute respiratory failure with hypoxia secondary to symptomatic severe aortic stenosis/acute on chronic diastolic CHF exacerbation. Initially requiring BiPAP now transtiion to nasal cannula. Awaiting TAVR. Significant PMH: hypertension, hyperlipidemia, severe aortic stenosis, moderate aortic insufficiency, chronic diastolic heart failure, spinal stenosis.  ? ?Clinical Impression ?  ? Pt is a pleasant 88 y/o male whom lives with his wife and daughter. At baseline, pt daughter does household cooking and cleaning per pt report. He reports Mod I with bathing and dressing and was Mod I donning socks sitting at EOB this morning. He does become SOB but O2 stayed between 95-100% on 4 L O2 via [place]. Pt was educated in role of OT and recommendations for acute OT as in-pt for pt education and energy conservation techniques and activity tolerance. Pt plans to d/c home in next 1-2 days as medically able ?   ? ?Recommendations for follow up therapy are one component of a multi-disciplinary discharge planning process, led by the attending physician.  Recommendations may be updated based on patient status, additional functional criteria and insurance authorization.  ? ?Follow Up Recommendations ? Home health OT  ?  ?Assistance Recommended at Discharge Intermittent Supervision/Assistance  ?Patient can return home with the following A little help with bathing/dressing/bathroom;A little help with walking and/or transfers;Assistance with cooking/housework;Direct supervision/assist for medications management;Direct supervision/assist for financial management ? ?  ?Functional Status Assessment ? Patient has had a recent decline in their functional status and demonstrates the ability to make significant improvements in function in a reasonable  and predictable amount of time.  ?Equipment Recommendations ? Tub/shower seat  ?  ?   ?Precautions / Restrictions Precautions ?Precautions: Fall ?Restrictions ?Weight Bearing Restrictions: No  ? ?  ? ?Mobility Bed Mobility ?Overal bed mobility: Needs Assistance ?Bed Mobility: Supine to Sit ?  ?  ?Supine to sit: Modified independent (Device/Increase time), HOB elevated ?  ?  ? ?Transfers ?Overall transfer level: Needs assistance ?Equipment used: Rolling walker (2 wheels) ?Transfers: Sit to/from Stand ?Sit to Stand: Min guard ?  ?  ?  ?Balance Overall balance assessment: Needs assistance ?Sitting-balance support: Feet supported ?Sitting balance-Leahy Scale: Good ?  ?  ?Standing balance support: Bilateral upper extremity supported, During functional activity, Reliant on assistive device for balance ?Standing balance-Leahy Scale: Poor ?  ?   ? ?ADL either performed or assessed with clinical judgement  ? ?ADL Overall ADL's : Needs assistance/impaired ?Eating/Feeding: Set up;Sitting ?Eating/Feeding Details (indicate cue type and reason): Pt reports that daughter does cooking and meal prep for him and his wife. Daughter lives with pt ?Grooming: Set up;Sitting ?  ?Upper Body Bathing: Set up;Sitting ?  ?Lower Body Bathing: Min guard;Sitting/lateral leans;Sit to/from stand ?  ?Upper Body Dressing : Set up;Sitting ?  ?Lower Body Dressing: Min guard;Sitting/lateral leans;Sit to/from stand ?  ?Toilet Transfer: Min guard;Minimal assistance;Ambulation;Rolling walker (2 wheels);BSC/3in1 ?Toilet Transfer Details (indicate cue type and reason): Simulated from EOB to chair. Pt using pure wick and denies need for toilet this am ?  ?Functional mobility during ADLs: Min guard;Minimal assistance;Rolling walker (2 wheels);Cueing for safety ?General ADL Comments: Pt is a pleasant 88 y/o male whom lives with his wife and daughter. At baseline, pt daughter does household cooking and cleaning per pt report. He reports Mod I with bathing and  dressing and was Mod I  donning socks sitting at EOB this morning. He does become SOB but O2 stayed between 95-100% on 4 L O2 via [place]. Pt was educated in role of OT and recommendations for acute OT as in-pt for pt education and energy conservation techniques and activity tolerance. Pt plans to d/c home in next 1-2 days as medically able  ? ? ? ?Vision  Pt has glasses but states "I don't wear them" Denies any changes. ?   ?   ?   ?   ? ?Pertinent Vitals/Pain Pain Assessment ?Pain Assessment: No/denies pain  ? ? ? ?Hand Dominance Right ?  ?Extremity/Trunk Assessment Upper Extremity Assessment ?Upper Extremity Assessment: Overall WFL for tasks assessed ?  ?Lower Extremity Assessment ?Lower Extremity Assessment: Defer to PT evaluation;Generalized weakness ?  ?Cervical / Trunk Assessment ?Cervical / Trunk Assessment: Kyphotic ?  ?Communication Communication ?Communication: No difficulties ?  ?Cognition Arousal/Alertness: Awake/alert ?Behavior During Therapy: [facility] for tasks assessed/performed ?Overall Cognitive Status: Within Functional Limits for tasks assessed ?  ?  ?  ?General Comments  Pt expresses frustration of returning to/from hospital secondary to SOB & is currently awaiting TAVR procedure per his report. ? ?  ?   ?   ? ? ?Home Living Family/patient expects to be discharged to:: Private residence ?Living Arrangements: Spouse/significant other;Children ?Available Help at Discharge: Family ?Type of Home: House ?Home Access: Stairs to enter ?Entrance Stairs-Number of Steps: 3-4 ?Entrance Stairs-Rails: Right ?Home Layout: Able to live on main level with bedroom/bathroom;Two level ?  ?  ?Bathroom Shower/Tub: Walk-in shower ?  ?Bathroom Toilet: Standard ?Bathroom Accessibility: Yes ?How Accessible: Accessible via walker ?Home Equipment: Agricultural consultant (2 wheels);Cane - single point ?  ?  ?  ? ?  ?Prior Functioning/Environment Prior Level of Function : Independent/Modified Independent;Driving ?  ?  ?Mobility Comments:  Ambulates with RW at baseline. Drives ?ADLs Comments: Pt reports Mod I ADL's. Daughter lives with pt and his spouse. Daughter does cooking and household cleaning per pt ?  ? ?  ?  ?OT Problem List: Decreased knowledge of use of DME or AE;Decreased knowledge of precautions;Decreased activity tolerance;Cardiopulmonary status limiting activity;Impaired balance (sitting and/or standing) ?  ?   ?OT Treatment/Interventions: Self-care/ADL training;Patient/family education;Therapeutic activities;Energy conservation;DME and/or AE instruction  ?  ?OT Goals(Current goals can be found in the care plan section) Acute Rehab OT Goals ?Patient Stated Goal: Get better and not have to come back to hospital ?OT Goal Formulation: With patient ?Time For Goal Achievement: 02/04/22 ?Potential to Achieve Goals: Good  ?OT Frequency: Min 2X/week ?  ? ?   ?AM-PAC OT "6 Clicks" Daily Activity     ?Outcome Measure Help from another person eating meals?: None ?Help from another person taking care of personal grooming?: A Little ?Help from another person toileting, which includes using toliet, bedpan, or urinal?: A Little ?Help from another person bathing (including washing, rinsing, drying)?: A Little ?Help from another person to put on and taking off regular upper body clothing?: None ?Help from another person to put on and taking off regular lower body clothing?: A Little ?6 Click Score: 20 ?  ?End of Session Equipment Utilized During Treatment: Gait belt;Rolling walker (2 wheels) ?Nurse Communication: Mobility status ? ?Activity Tolerance: Patient tolerated treatment well ?Patient left: in chair;with call bell/phone within reach;with chair alarm set ? ?OT Visit Diagnosis: Muscle weakness (generalized) (M62.81);Unsteadiness on feet (R26.81)  ?              ?Time: 9381-8299 ?OT Time Calculation (min):  29 min ?Charges:  OT General Charges ?$OT Visit: 1 Visit ?OT Evaluation ?$OT Eval Moderate Complexity: 1 Mod ? ?Alm Bustard,  OTR/L ?01/22/2022, 12:34 PM ?

## 2022-01-22 NOTE — Progress Notes (Signed)
Subjective:  ?Breathing improved ? ?Objective:  ?Vital Signs in the last 24 hours: ?Temp:  [97.6 ?F (36.4 ?C)-98.6 ?F (37 ?C)] 98.2 ?F (36.8 ?C) (04/19 1222) ?Pulse Rate:  [80-96] 96 (04/19 1222) ?Resp:  [15-21] 20 (04/19 1222) ?BP: (95-135)/(68-79) 110/78 (04/19 1222) ?SpO2:  [98 %-100 %] 100 % (04/19 1222) ?Weight:  [69.3 kg-70.9 kg] 69.3 kg (04/19 0348) ? ?Intake/Output from previous day: ?04/18 0701 - 04/19 0700 ?In: 1201.4 [P.O.:850; IV Piggyback:351.4] ?Out: 2800 [Urine:2800] ? ?Physical Exam ?Vitals and nursing note reviewed.  ?Constitutional:   ?   General: He is not in acute distress. ?Neck:  ?   Vascular: No JVD.  ?Cardiovascular:  ?   Rate and Rhythm: Normal rate and regular rhythm.  ?   Heart sounds: Murmur heard.  ?Harsh midsystolic murmur is present with a grade of 3/6 at the upper right sternal border radiating to the neck.  ?Pulmonary:  ?   Effort: Pulmonary effort is normal.  ?   Breath sounds: Normal breath sounds. No wheezing or rales.  ?Musculoskeletal:  ?   Right lower leg: Edema (Trace) present.  ?   Left lower leg: Edema (Trace) present.  ? ? ?Cardiac Studies: ? ?Imaging/tests reviewed and independently interpreted: ? ? ?EKG 01/21/2022: ?Sinus tachycardia ?Left atrial enlargement ?Probable LVH with secondary repol abnrm ?Borderline prolonged QT interval ?  ?Echocardiogram 12/13/2021: ? 1. Left ventricular ejection fraction, by estimation, is 50 to 55%. The  ?left ventricle has low normal function. The left ventricle has no regional  ?wall motion abnormalities. There is mild concentric left ventricular  ?hypertrophy. Left ventricular  ?diastolic parameters are consistent with Grade II diastolic dysfunction  ?(pseudonormalization).  ? 2. Right ventricular systolic function is normal. The right ventricular  ?size is normal. There is mildly elevated pulmonary artery systolic  ?pressure.  ? 3. Left atrial size was moderately dilated.  ? 4. The mitral valve is degenerative. No evidence of mitral valve   ?regurgitation.  ? 5. Difficult assessment for mitral stenosis, suboptimal valve morpology,  ?continuity equation valve area deferred in the setting of AI.  ? 6. The aortic valve is calcified. Aortic valve regurgitation is mild to  ?moderate. Severe aortic valve stenosis. Aortic valve mean gradient  ?measures 35.0 mmHg. Aortic valve Vmax measures 3.74 m/s. Aortic valve  ?acceleration time measures 95 msec.  ?Decreased stroke voluem index.  ? 7. The inferior vena cava is normal in size with greater than 50%  ?respiratory variability, suggesting right atrial pressure of 3 mmHg.  ? ?Comparison(s): No significant change from prior study.  ? ?Assessment & Recommendations: ? ?88 y.o. African-American male with hypertension, hyperlipidemia, symptomatic stage D1 severe AS, mod AI, HFpEF. ?  ?Severe AS, mod AI: ?Symptomatic stage D1, with acute decompensated HFpEF. ?Now improving. May need one more dose of IV lasix.  ?Discussed with TAVR team. Tentatively planning for TAVR next Tuesday 4/25. ?Risks of hospital acquired infections not insignificant if we were to keep him inpatient until TAVR. He also had hospital delirium during his last hospitalization. ?May discharge home on torsemide 20 mg bid, in place of lasix 40 mg bid.  ?  ?Troponin elevation: ?Secondary to severe AS, HFpEF. Not ACS.  ?  ?Hypertension: ?Controlled ?  ?  ?Discussed interpretation of tests and management recommendations with the primary team ?  ? ? ?Nigel Mormon, MD ?Pager: [phone] ?Office: [phone] ? ?

## 2022-01-22 NOTE — Assessment & Plan Note (Signed)
Due to AS. ?Appears euvolemic and better today ?- Consult Cardiology ?- Trnasition to oral torsemide and osberve ?- Check BMP ?

## 2022-01-22 NOTE — Evaluation (Signed)
Physical Therapy Evaluation ?Patient Details ?Name: Marcus Powers ?MRN: [id number] ?DOB: 1928-08-02 ?Today's Date: 01/22/2022 ? ?History of Present Illness ? Pt is a 88 y.o. M who presents 4/18 with  acute respiratory failure with hypoxia secondary to symptomatic severe aortic stenosis/acute on chronic diastolic CHF exacerbation. Initially requiring BiPAP now transtiion to nasal cannula. Awaiting TAVR. Significant PMH: hypertension, hyperlipidemia, severe aortic stenosis, moderate aortic insufficiency, chronic diastolic heart failure, spinal stenosis.  ?Clinical Impression ? PTA, pt lives with his spouse and daughter, uses a RW for mobility and is independent with ADL's. Pt presents with decreased functional mobility secondary to generalized weakness, impaired balance, and decreased cardiopulmonary endurance. Pt ambulating 100 ft with a walker at a min guard assist level. HR 93-122 bpm, weaned O2 to 2L O2 and pt sats 97%. Would benefit from HHPT to address deficits and maximize functional mobility. ?   ? ?Recommendations for follow up therapy are one component of a multi-disciplinary discharge planning process, led by the attending physician.  Recommendations may be updated based on patient status, additional functional criteria and insurance authorization. ? ?Follow Up Recommendations Home health PT ? ?  ?Assistance Recommended at Discharge PRN  ?Patient can return home with the following ? A little help with walking and/or transfers;Assistance with cooking/housework;Assist for transportation;Help with stairs or ramp for entrance ? ?  ?Equipment Recommendations None recommended by PT  ?Recommendations for Other Services ?    ?  ?Functional Status Assessment Patient has had a recent decline in their functional status and demonstrates the ability to make significant improvements in function in a reasonable and predictable amount of time.  ? ?  ?Precautions / Restrictions Precautions ?Precautions:  Fall ?Restrictions ?Weight Bearing Restrictions: No  ? ?  ? ?Mobility ? Bed Mobility ?  ?  ?  ?  ?  ?  ?  ?General bed mobility comments: OOB in chair ?  ? ?Transfers ?Overall transfer level: Needs assistance ?Equipment used: Rolling walker (2 wheels) ?Transfers: Sit to/from Stand ?Sit to Stand: Min guard ?  ?  ?  ?  ?  ?  ?  ? ?Ambulation/Gait ?Ambulation/Gait assistance: Min guard ?Gait Distance (Feet): 100 Feet ?Assistive device: Rolling walker (2 wheels) ?Gait Pattern/deviations: Step-through pattern, Decreased stride length, Trunk flexed ?Gait velocity: decreased ?  ?  ?General Gait Details: Poor proximity to RW despite cues, pushing too far forward, min guard for safety. Decreased bilateral foot clearance. Fatigues easily ? ?Stairs ?  ?  ?  ?  ?  ? ?Wheelchair Mobility ?  ? ?Modified Rankin (Stroke Patients Only) ?  ? ?  ? ?Balance Overall balance assessment: Needs assistance ?Sitting-balance support: Feet supported ?Sitting balance-Leahy Scale: Good ?  ?  ?Standing balance support: Bilateral upper extremity supported, During functional activity, Reliant on assistive device for balance ?Standing balance-Leahy Scale: Poor ?  ?  ?  ?  ?  ?  ?  ?  ?  ?  ?  ?  ?   ? ? ? ?Pertinent Vitals/Pain Pain Assessment ?Pain Assessment: No/denies pain  ? ? ?Home Living Family/patient expects to be discharged to:: Private residence ?Living Arrangements: Spouse/significant other;Children ?Available Help at Discharge: Family ?Type of Home: House ?Home Access: Stairs to enter ?Entrance Stairs-Rails: Right ?Entrance Stairs-Number of Steps: 3-4 ?  ?Home Layout: Able to live on main level with bedroom/bathroom;Two level ?Home Equipment: Conservation officer, nature (2 wheels);Cane - single point ?   ?  ?Prior Function Prior Level of Function : Independent/Modified Independent;Driving ?  ?  ?  ?  ?  ?  ?  Mobility Comments: Ambulates with RW at baseline. Drives ?ADLs Comments: Pt reports Mod I ADL's. Daughter lives with pt and his spouse. Daughter  does cooking and household cleaning per pt ?  ? ? ?Hand Dominance  ? Dominant Hand: Right ? ?  ?Extremity/Trunk Assessment  ? Upper Extremity Assessment ?Upper Extremity Assessment: Overall WFL for tasks assessed ?  ? ?Lower Extremity Assessment ?Lower Extremity Assessment: Generalized weakness ?  ? ?Cervical / Trunk Assessment ?Cervical / Trunk Assessment: Kyphotic  ?Communication  ? Communication: No difficulties  ?Cognition Arousal/Alertness: Awake/alert ?Behavior During Therapy: [facility] for tasks assessed/performed ?Overall Cognitive Status: Within Functional Limits for tasks assessed ?  ?  ?  ?  ?  ?  ?  ?  ?  ?  ?  ?  ?  ?  ?  ?  ?  ?  ?  ? ?  ?General Comments   ? ?  ?Exercises    ? ?Assessment/Plan  ?  ?PT Assessment Patient needs continued PT services  ?PT Problem List Decreased strength;Decreased activity tolerance;Decreased balance;Decreased mobility;Cardiopulmonary status limiting activity ? ?   ?  ?PT Treatment Interventions DME instruction;Gait training;Stair training;Functional mobility training;Therapeutic activities;Balance training;Therapeutic exercise;Patient/family education   ? ?PT Goals (Current goals can be found in the Care Plan section)  ?Acute Rehab PT Goals ?Patient Stated Goal: eat breakfast ?PT Goal Formulation: With patient ?Time For Goal Achievement: 02/05/22 ?Potential to Achieve Goals: Good ? ?  ?Frequency Min 3X/week ?  ? ? ?Co-evaluation   ?  ?  ?  ?  ? ? ?  ?AM-PAC PT "6 Clicks" Mobility  ?Outcome Measure Help needed turning from your back to your side while in a flat bed without using bedrails?: None ?Help needed moving from lying on your back to sitting on the side of a flat bed without using bedrails?: A Little ?Help needed moving to and from a bed to a chair (including a wheelchair)?: A Little ?Help needed standing up from a chair using your arms (e.g., wheelchair or bedside chair)?: A Little ?Help needed to walk in hospital room?: A Little ?Help needed climbing 3-5 steps with a  railing? : A Lot ?6 Click Score: 18 ? ?  ?End of Session Equipment Utilized During Treatment: Gait belt;Oxygen ?Activity Tolerance: Patient tolerated treatment well ?Patient left: in chair;with call bell/phone within reach ?Nurse Communication: Mobility status ?PT Visit Diagnosis: Unsteadiness on feet (R26.81);Muscle weakness (generalized) (M62.81);Difficulty in walking, not elsewhere classified (R26.2) ?  ? ?Time: [phone] ?PT Time Calculation (min) (ACUTE ONLY): 20 min ? ? ?Charges:   PT Evaluation ?$PT Eval Moderate Complexity: 1 Mod ?  ?  ?   ? ? ?Wyona Almas, PT, DPT ?Acute Rehabilitation Services ?Pager [phone] ?Office [phone] ? ? ?Carloine Margo Aye ?01/22/2022, 12:23 PM ? ?

## 2022-01-22 NOTE — Assessment & Plan Note (Signed)
Troponin elevated due to aortic stenosis, not acs or ishcemia. ?

## 2022-01-22 NOTE — Consult Note (Signed)
?[facility]   ?MULTIDISCIPLINARY HEART VALVE CLINIC ?  ? ? ?   ?[address] ?      [place] [address] ?            [phone]   ?      ? ?CARDIOTHORACIC SURGERY CONSULTATION REPORT ? ?PCP is Kirby Funk, MD ?Referring Provider is Earney Hamburg, MD ?Primary Cardiologist is Truett Mainland, MD ? ?Reason for consultation:  Severe aortic stenosis ? ?HPI: ? ?The patient is a 88 year old gentleman with a history of hypertension, hyperlipidemia, aortic stenosis and chronic diastolic congestive heart failure, who has been followed by Dr. Rosemary Holms.  He was admitted to the hospital in May 2022 with shortness of breath and found to have a BNP greater than 800.  An echocardiogram at that time show severe aortic stenosis with a mean gradient of 47.2 mmHg and a valve area of 0.9 cm? by VTI.  Ejection fraction is 50 to 55% with mild mitral regurgitation.  He was treated with intravenous Lasix with symptom resolution.  He was seen by Dr. Rosemary Holms in July 2022 and was doing well and still mowing his yard and doing things around the house without shortness of breath or chest discomfort.  There was apparently some discussion about referring him for TAVR work-up but he was reluctant.  Plans were made for repeat echocardiogram in a few months.  He had an echocardiogram in October 2022 showing a trileaflet aortic valve with a mean gradient of 38 mmHg and a valve area of 0.6 cm? with a dimensionless index of 0.17 consistent with severe aortic stenosis.  There was moderate aortic insufficiency and moderate mitral regurgitation.  Left ventricular ejection fraction was 55% with moderate concentric LVH.  He continued to feel well and did not want to consider TAVR at that time because he was asymptomatic.  He now presents with worsening exertional shortness of breath and fatigue.  He has had 2 admissions in the past few weeks with severe shortness of breath occurring at rest.  He was admitted in  early March after developing severe shortness of breath at rest sitting on his couch with his wife.  When he went to stand up his legs were weak and he became severely short of breath.  EMS was called and his oxygen saturations were noted to be 85% on room air.  On arrival to [facility] his BNP was 960.  High-sensitivity troponin was 102 and 105.  He was treated with intravenous Lasix with a large amount of urine output and improvement in his symptoms.  He underwent cardiac catheterization showing no obstructive coronary disease.  Echocardiogram on 12/13/2021 showed a severely calcified aortic valve with a mean gradient of 35 mmHg and a valve area of 0.39 cm?.  There was mild to moderate aortic insufficiency with a pressure half-time of 315 ms.  He was sent home with plans for TAVR in May but is now admitted yesterday with another episode of severe shortness of breath at rest.  He denies any chest pain or pressure.  He has had orthopnea.  He has had peripheral edema.  He has had no dizziness or syncope.  He has been diuresed with improvement in his symptoms and he is currently off oxygen. ? ?Past Medical History:  ?Diagnosis Date  ? Aortic stenosis   ? Chronic diastolic heart failure (HCC)   ? Hyperlipidemia   ? Hypertension   ? Spinal stenosis   ? ? ?Past Surgical  History:  ?Procedure Laterality Date  ? BIOPSY BREAST    ? RIGHT HEART CATH AND CORONARY ANGIOGRAPHY N/A 12/13/2021  ? Procedure: RIGHT HEART CATH AND CORONARY ANGIOGRAPHY;  Surgeon: Elder Negus, MD;  Location: [facility] INVASIVE CV LAB;  Service: Cardiovascular;  Laterality: N/A;  ? ? ?Family History  ?Problem Relation Age of Onset  ? Cancer Mother   ? Stroke Father   ? ? ?Social History  ? ?Socioeconomic History  ? Marital status: Married  ?  Spouse name: Not on file  ? Number of children: Not on file  ? Years of education: Not on file  ? Highest education level: Not on file  ?Occupational History  ? Not on file  ?Tobacco Use  ? Smoking status: Former   ?  Packs/day: 0.25  ?  Years: 5.00  ?  Pack years: 1.25  ?  Types: Cigarettes  ?  Quit date: 1969  ?  Years since quitting: 54.3  ? Smokeless tobacco: Never  ?Vaping Use  ? Vaping Use: Never used  ?Substance and Sexual Activity  ? Alcohol use: No  ? Drug use: No  ? Sexual activity: Not on file  ?Other Topics Concern  ? Not on file  ?Social History Narrative  ? Not on file  ? ?Social Determinants of Health  ? ?Financial Resource Strain: Not on file  ?Food Insecurity: Not on file  ?Transportation Needs: Not on file  ?Physical Activity: Not on file  ?Stress: Not on file  ?Social Connections: Not on file  ?Intimate Partner Violence: Not on file  ? ? ?Prior to Admission medications   ?Medication Sig Start Date End Date Taking? Authorizing Provider  ?acetaminophen (TYLENOL) 650 MG CR tablet Take 650 mg by mouth daily.   Yes [provider]  ?furosemide (LASIX) 40 MG tablet Take 1 tablet (40 mg total) by mouth 2 (two) times daily. ?Patient taking differently: Take 40 mg by mouth daily. 01/19/22  Yes Kommor, Madison, MD  ?losartan (COZAAR) 100 MG tablet Take 100 mg by mouth daily. 12/31/20  Yes [provider]  ?pravastatin (PRAVACHOL) 20 MG tablet Take 20 mg by mouth daily. 12/31/20  Yes [provider]  ?Pseudoeph-Doxylamine-DM-APAP (NYQUIL PO) Take 1 Dose by mouth daily as needed (cough).   Yes [provider]  ?amLODipine (NORVASC) 5 MG tablet Take 5 mg by mouth daily.    [provider]  ? ? ?Current Facility-Administered Medications  ?Medication Dose Route Frequency Provider Last Rate Last Admin  ? 0.9 %  sodium chloride infusion  250 mL Intravenous PRN Rodolph Bong, MD      ? acetaminophen (TYLENOL) tablet 650 mg  650 mg Oral Q4H PRN Rodolph Bong, MD      ? enoxaparin (LOVENOX) injection 40 mg  40 mg Subcutaneous Q24H Rodolph Bong, MD   40 mg at 01/22/22 1709  ? ondansetron (ZOFRAN) injection 4 mg  4 mg Intravenous Q6H PRN Rodolph Bong, MD      ?  pravastatin (PRAVACHOL) tablet 20 mg  20 mg Oral Daily Rodolph Bong, MD   20 mg at 01/22/22 0955  ? sodium chloride flush (NS) 0.9 % injection 3 mL  3 mL Intravenous Q12H Rodolph Bong, MD   3 mL at 01/22/22 0834  ? sodium chloride flush (NS) 0.9 % injection 3 mL  3 mL Intravenous PRN Rodolph Bong, MD      ? torsemide [facility]) tablet 20 mg  20 mg  Oral BID Alberteen Sam, MD   20 mg at 01/22/22 1714  ? ? ?Allergies  ?Allergen Reactions  ? Benazepril Hcl Cough  ? ? ? ? ?Review of Systems: ? ? General:  normal appetite, + decreased energy, no weight gain, no weight loss, no fever ? Cardiac:  no chest pain with exertion, no chest pain at rest, +SOB with mild exertion, + resting SOB, no PND, + orthopnea, no palpitations, no arrhythmia, no atrial fibrillation, + LE edema, no dizzy spells, no syncope ? Respiratory:  + shortness of breath, no home oxygen, no productive cough, no dry cough, no bronchitis, no wheezing, no hemoptysis, no asthma, no pain with inspiration or cough, no sleep apnea, no CPAP at night ? GI:   no difficulty swallowing, no reflux, no frequent heartburn, no hiatal hernia, no abdominal pain, no constipation, no diarrhea, no hematochezia, no hematemesis, no melena ? GU:   no dysuria,  no frequency, no urinary tract infection, no hematuria, no enlarged prostate, no kidney stones, no kidney disease ? Vascular:  no pain suggestive of claudication, no pain in feet, no leg cramps, no varicose veins, no DVT, no non-healing foot ulcer ? Neuro:   no stroke, no TIA's, no seizures, no headaches, no temporary blindness one eye,  no slurred speech, no peripheral neuropathy, no chronic pain, + instability of gait, no memory/cognitive dysfunction ? Musculoskeletal: no arthritis, no joint swelling, no myalgias, + difficulty walking, + decreased mobility  ? Skin:   no rash, no itching, no skin infections, no pressure sores or ulcerations ? Psych:   no anxiety, no depression, no nervousness, no  unusual recent stress ? Eyes:   no blurry vision, no floaters, no recent vision changes, + wears glasses or contacts ? ENT:   no hearing loss, no loose or painful teeth, no dentures, last saw dentist years ag

## 2022-01-22 NOTE — Assessment & Plan Note (Signed)
-   Transition to oral torsemide and observe ?

## 2022-01-22 NOTE — Assessment & Plan Note (Signed)
Continue pravastatin 

## 2022-01-22 NOTE — Progress Notes (Signed)
?  Progress Note ? ? ?Patient: Marcus Powers MEB:[id number] DOB: 06-03-1928 DOA: 01/21/2022     1 ?DOS: the patient was seen and examined on 01/22/2022 at 9:27AM ?  ? ? ? ?Brief hospital course: ?Mr. [name] is a 88 y.o. M with severe AS in work up for TAVR, HTN, HLD, dCHF, and spinal stenosis who presented again for SOB. ? ?2 days prior to this admission, developed acute SOB, came to the ER, found to have pulmonary edema on CXR, given diuretics and improved, and was discharged home on oral diuretics.   ? ?Patient returned with acute recurrence of SOB with any exertion despite compliance with diuretics.  In the ER, hypoxic to 80s and working to breathe, requiring BiPAP. ? ? ? ? ?Assessment and Plan: ?* Acute respiratory distress ?Acute respiratory failure ruled out. ?Patient had flash pulmonary edema, some transient hypoxemia, with distress, but this improved rapidly with Lasix in the ER. ? ?Symptomatic severe aortic stenosis with normal ejection fraction ?- Transition to oral torsemide and observe ? ?Acute on chronic diastolic heart failure (HCC) ?Due to AS. ?Appears euvolemic and better today ?- Consult Cardiology ?- Trnasition to oral torsemide and osberve ?- Check BMP ? ?Normocytic anemia ?Hgb stable relative to baseline ? ?Myocardial injury ?Troponin elevated due to aortic stenosis, not acs or ishcemia. ? ?Hyperlipidemia ?-Continue pravastatin ? ?Essential hypertension ?BP soft this morning ?-Hold amlodipine, losartan ? ? ? ? ? ? ? ? ? ?Subjective: Patient still feeling out of breath, he has no swelling, he has no orthopnea, no confusion, no fever, no sputum ? ? ? ? ?Physical Exam: ?Vitals:  ? 01/21/22 2315 01/22/22 0348 01/22/22 0737 01/22/22 1222  ?BP: 118/72 115/70 95/77 110/78  ?Pulse: 87 80 92 96  ?Resp: 17 15 16 20   ?Temp: 98.1 ?F (36.7 ?C) 98.6 ?F (37 ?C) 97.6 ?F (36.4 ?C) 98.2 ?F (36.8 ?C)  ?TempSrc: Oral Oral Oral Oral  ?SpO2: 100% 100% 100% 100%  ?Weight:  69.3 kg    ?Height:      ? ?Elderly adult male,  sitting up in recliner, eating breakfast, drinking coffee, interactive ?RRR, systolic murmur noted, no peripheral edema, no rales on exam ?Lung sounds clear without wheezing, no increased respiratory effort ?Abdomen soft without tenderness palpation or guarding ?Attention normal, affect appropriate, judgment and insight appear normal for age, face symmetric, speech fluent ? ?Data Reviewed: ?Discussed with cardiology and CT surgery, cardiology notes reviewed. ?Creatinine 1.2, no change ?Magnesium normal ?BNP 800 ?Troponin 200 ?Hemoglobin 10 ? ?Family Communication: Wife by phone ? ? ? ?Disposition: ?Status is: Inpatient ?Remains inpatient appropriate because: The patient presented again with severe critical aortic stenosis, symptomatic with congestive heart failure flash pulmonary edema. ? ?He has improved, we will transition to oral diuretic.  Given his third presentation in the last week, I will keep him an additional day to make sure that his volume status and symptoms remain controlled ? ? ? ? ? ? ? ?Author: ? , MD ?01/22/2022 5:23 PM ? ?For on call review [email or web address].  ? ? ?

## 2022-01-23 ENCOUNTER — Encounter: Payer: Medicare Other | Admitting: Surgery

## 2022-01-23 DIAGNOSIS — D649 Anemia, unspecified: Secondary | ICD-10-CM

## 2022-01-23 DIAGNOSIS — I5A Non-ischemic myocardial injury (non-traumatic): Secondary | ICD-10-CM

## 2022-01-23 LAB — BASIC METABOLIC PANEL
Anion gap: 9 (ref 5–15)
BUN: 25 mg/dL — ABNORMAL HIGH (ref 8–23)
CO2: 26 mmol/L (ref 22–32)
Calcium: 8.9 mg/dL (ref 8.9–10.3)
Chloride: 100 mmol/L (ref 98–111)
Creatinine, Ser: 1.35 mg/dL — ABNORMAL HIGH (ref 0.61–1.24)
GFR, Estimated: 50 mL/min — ABNORMAL LOW (ref >60)
Glucose, Bld: 107 mg/dL — ABNORMAL HIGH (ref 70–99)
Potassium: 3.6 mmol/L (ref 3.5–5.1)
Sodium: 135 mmol/L (ref 135–145)

## 2022-01-23 MED ORDER — TORSEMIDE 20 MG PO TABS: 20.0000 mg | ORAL_TABLET | Freq: Two times a day (BID) | ORAL | 1 refills | Status: DC

## 2022-01-23 NOTE — Progress Notes (Signed)
Surgical Instructions ? ? ? Your procedure is scheduled on Tuesday April 25. ? Report to [facility] Main Entrance "A" at 5:30 A.M., then check in with the Admitting office. ? Call this number if you have problems the morning of surgery: ? [phone] ? ? If you have any questions prior to your surgery date call [phone]: Open Monday-Friday 8am-4pm ? ? ? Remember: ? Do not eat or drink anything after midnight the night before your surgery ? ?  ? Take these medicines the morning of surgery with A SIP OF WATER:  ?acetaminophen (TYLENOL)  ?pravastatin (PRAVACHOL) ? ?As of today, STOP taking any Aspirin (unless otherwise instructed by your surgeon) Aleve, Naproxen, Ibuprofen, Motrin, Advil, Goody's, BC's, all herbal medications, fish oil, and all vitamins. ? ?         ?Do not wear jewelry or makeup ?Do not wear lotions, powders, perfumes/colognes, or deodorant. ?Do not shave 48 hours prior to surgery.  Men may shave face and neck. ?Do not bring valuables to the hospital. ?Do not wear nail polish, gel polish, artificial nails, or any other type of covering on natural nails (fingers and toes) ?If you have artificial nails or gel coating that need to be removed by a nail salon, please have this removed prior to surgery. Artificial nails or gel coating may interfere with anesthesia's ability to adequately monitor your vital signs. ? ?[facility] is not responsible for any belongings or valuables. .  ? ?Do NOT Smoke (Tobacco/Vaping)  24 hours prior to your procedure ? ?If you use a CPAP at night, you may bring your mask for your overnight stay. ?  ?Contacts, glasses, hearing aids, dentures or partials may not be worn into surgery, please bring cases for these belongings ?  ?For patients admitted to the hospital, discharge time will be determined by your treatment team. ?  ?Patients discharged the day of surgery will not be allowed to drive home, and someone needs to stay with them for 24 hours. ? ? ?SURGICAL WAITING ROOM  VISITATION ?Patients having surgery or a procedure in a hospital may have two support people. ?Children under the age of 54 must have an adult with them who is not the patient. ?They may stay in the waiting area during the procedure and may switch out with other visitors. If the patient needs to stay at the hospital during part of their recovery, the visitor guidelines for inpatient rooms apply. ? ?Please refer to the [facility] website for the visitor guidelines for Inpatients (after your surgery is over and you are in a regular room).  ? ? ? ? ? ?Special instructions:   ? ?Oral Hygiene is also important to reduce your risk of infection.  Remember - BRUSH YOUR TEETH THE MORNING OF SURGERY WITH YOUR REGULAR TOOTHPASTE ? ? ?[facility]- Preparing For Surgery ? ?Before surgery, you can play an important role. Because skin is not sterile, your skin needs to be as free of germs as possible. You can reduce the number of germs on your skin by washing with CHG (chlorahexidine gluconate) Soap before surgery.  CHG is an antiseptic cleaner which kills germs and bonds with the skin to continue killing germs even after washing.   ? ? ?Please do not use if you have an allergy to CHG or antibacterial soaps. If your skin becomes reddened/irritated stop using the CHG.  ?Do not shave (including legs and underarms) for at least 48 hours prior to first CHG shower. It is OK  to shave your face. ? ?Please follow these instructions carefully. ?  ? ? Shower the NIGHT BEFORE SURGERY and the MORNING OF SURGERY with CHG Soap.  ? If you chose to wash your hair, wash your hair first as usual with your normal shampoo. After you shampoo, rinse your hair and body thoroughly to remove the shampoo.  Then [employer] and genitals (private parts) with your normal soap and rinse thoroughly to remove soap. ? ?After that Use CHG Soap as you would any other liquid soap. You can apply CHG directly to the skin and wash gently with a scrungie or a clean  washcloth.  ? ?Apply the CHG Soap to your body ONLY FROM THE NECK DOWN.  Do not use on open wounds or open sores. Avoid contact with your eyes, ears, mouth and genitals (private parts). Wash Face and genitals (private parts)  with your normal soap.  ? ?Wash thoroughly, paying special attention to the area where your surgery will be performed. ? ?Thoroughly rinse your body with warm water from the neck down. ? ?DO NOT shower/wash with your normal soap after using and rinsing off the CHG Soap. ? ?Pat yourself dry with a CLEAN TOWEL. ? ?Wear CLEAN PAJAMAS to bed the night before surgery ? ?Place CLEAN SHEETS on your bed the night before your surgery ? ?DO NOT SLEEP WITH PETS. ? ? ?Day of Surgery: ? ?Take a shower with CHG soap. ?Wear Clean/Comfortable clothing the morning of surgery ?Do not apply any deodorants/lotions.   ?Remember to brush your teeth WITH YOUR REGULAR TOOTHPASTE. ? ? ? ?If you received a COVID test during your pre-op visit, it is requested that you wear a mask when out in public, stay away from anyone that may not be feeling well, and notify your surgeon if you develop symptoms. If you have been in contact with anyone that has tested positive in the last 10 days, please notify your surgeon. ? ?  ?Please read over the following fact sheets that you were given.   ?

## 2022-01-23 NOTE — Discharge Summary (Signed)
?Physician Discharge Summary ?  ?Patient: Marcus Powers MRN: [id number] DOB: [birth year]  ?Admit date:     01/21/2022  ?Discharge date: 01/23/22  ?Discharge Physician: Alberteen Sam  ? ?PCP: Kirby Funk, MD  ? ? ? ?Recommendations at discharge:  ?Follow up with Cardiology and CT surgery as directed for TAVR next Monday ?Note amlodipine and losartan have been stopped ? ? ? ? ?Discharge Diagnoses: ?Principal Problem: ?  Acute respiratory distress ?Active Problems: ?  Symptomatic severe aortic stenosis with normal ejection fraction ?  Acute on chronic diastolic heart failure (HCC) ?  Essential hypertension ?  Hyperlipidemia ?  Myocardial injury ?  Normocytic anemia ? ?  ? ? ?Hospital Course: ?Marcus Powers is a 88 y.o. M with severe AS in work up for TAVR, HTN, HLD, dCHF, and spinal stenosis who presented again for SOB. ? ?2 days prior to this admission, developed acute SOB, came to the ER, found to have pulmonary edema on CXR, given diuretics and improved, and was discharged home on oral diuretics.   ? ?Patient returned with acute recurrence of SOB with any exertion despite compliance with diuretics.  In the ER, hypoxic to 80s and working to breathe, requiring BiPAP. ? ? ? ? ? ?* Acute respiratory distress due to symptomatic severe aortic stenosis with normal ejection fraction ?Acute respiratory failure ruled out. ?Patient had flash pulmonary edema on presentation, some transient hypoxemia, with distress, but this improved rapidly with Lasix in the ER. ? ?Here, he was transitioned to oral torsemide and his symptoms resolved and he appeared euvolemic. ? ?CT surgery and Cardiology coordinated and plan for TAVR at the earliest possible moment next Tuesday.    ? ? ?  ? ?Myocardial injury ?Troponin elevated due to aortic stenosis, not acs or ischemia. ?  ?Essential hypertension ?BP soft, amlodipine and losartan stopped at discharge. ? ? ? ?  ? ? ? ?  ? ?Consultants: Cardiology, CT surgery ?  ?Disposition:  Home ? ? ?DISCHARGE MEDICATION: ?Allergies as of 01/23/2022   ? ?   Reactions  ? Benazepril Hcl Cough  ? ?  ? ?  ?Medication List  ?  ? ?STOP taking these medications   ? ?amLODipine 5 MG tablet ?Commonly known as: NORVASC ?  ?furosemide 40 MG tablet ?Commonly known as: LASIX ?  ?losartan 100 MG tablet ?Commonly known as: COZAAR ?  ?NYQUIL PO ?  ? ?  ? ?TAKE these medications   ? ?acetaminophen 650 MG CR tablet ?Commonly known as: TYLENOL ?Take 650 mg by mouth daily. ?  ?pravastatin 20 MG tablet ?Commonly known as: PRAVACHOL ?Take 20 mg by mouth daily. ?  ?torsemide 20 MG tablet ?Commonly known as: DEMADEX ?Take 1 tablet (20 mg total) by mouth 2 (two) times daily. ?  ? ?  ? ? ? ?Discharge Instructions   ? ? Discharge instructions   Complete by: As directed ?  ? Stop furosemide ?Start torsemide in its place ? ?For now, STOP losartan and amlodine also ?Follow the instructions given by Dr. Rosemary Holms and the heart valve surgeons  ? Increase activity slowly   Complete by: As directed ?  ? ?  ? ? ?Discharge Exam: ?Filed Weights  ? 01/21/22 2011 01/22/22 0962 01/23/22 0327  ?Weight: 70.9 kg 69.3 kg 68.1 kg  ? ? ?General: Pt is alert, awake, not in acute distress ?Cardiovascular: RRR, nl S1-S2, loud systolic murmur appreciated.   No LE edema.   ?Respiratory: Normal respiratory rate and rhythm.  CTAB  without rales or wheezes. ?Abdominal: Abdomen soft and non-tender.  No distension or HSM.   ?Neuro/Psych: Strength symmetric in upper and lower extremities.  Judgment and insight appear normal. ? ? ?Condition at discharge: good ? ?The results of significant diagnostics from this hospitalization (including imaging, microbiology, ancillary and laboratory) are listed below for reference.  ? ?Imaging Studies: ?CT CORONARY MORPH W/CTA COR W/SCORE W/CA W/CM &/OR WO/CM ? ?Addendum Date: 12/26/2021   ?ADDENDUM REPORT: 12/26/2021 11:07 CLINICAL DATA:  Aortic Valve pathology with assessment for TAVR EXAM: Cardiac TAVR CT TECHNIQUE: The  patient was scanned on a Siemens Force 192 slice scanner. A 120 kV retrospective scan was triggered in the descending thoracic aorta at 111 HU's. Gantry rotation speed was 270 msecs and collimation was .9 mm. No beta blockade or nitro were given. The 3D data set was reconstructed in 5% intervals of the R-R cycle. Systolic and diastolic phases were analyzed on a dedicated work station using MPR, MIP and VRT modes. The patient received 100 cc of contrast. FINDINGS: Aortic Valve: Severely thickened tri-leaflet aortic valve with heavy calcification and reduced excursion the planimeter valve area is 0.819 Sq cm consistent with severe aortic stenosis Number of leaflets: 3 LVOT calcification: None Annular calcification: None Aortic Valve Calcium Score: 4327 Presence of basal septal hypertrophy: No Perimembranous septal diameter: 11 mm Mitral Valve: Mild mitral annular calcification Aortic Annulus Measurements- 20% Phase assessed Major annulus diameter: 30 mm Minor annulus diameter: 24 mm Annular perimeter: 83 mm Annular area: 5.11 cm2 Aortic Root Measurements Sinotubular Junction: 28 mm Ascending Thoracic Aorta: 34 mm Aortic Arch: 26 mm Descending Thoracic Aorta: 18 mm Aortic atherosclerosis. Sinus of Valsalva Measurements: Right coronary cusp width: 31 mm Left coronary cusp width: 34 mm Non coronary cusp width: 33 mm Mean diameter: 32 mm Coronary Artery Height above Annulus: Left Main: 16 mm Left SoV height: 22 mm Right Coronary: 19 mm Right SoV height: 22 mm Optimum Fluoroscopic Angle for Delivery: LAO 10, CAU 12 Valves for structural team consideration: 26 mm Edwards Sapien is favored. 29 mm CoreValve Non TAVR Valve Findings: Coronary Calcium Score: Left main: 141 Left anterior descending artery: 87 Left circumflex artery: 29 Right coronary artery: 93 Total: 350 IMPRESSION: 1. Severe Aortic stenosis. Findings pertinent to TAVR procedure are detailed above. 2. Patient's total coronary artery calcium score is 350.  RECOMMENDATIONS: Coronary artery calcium (CAC) score is a strong predictor of incident coronary heart disease (CHD) and provides predictive information beyond traditional risk factors. CAC scoring is reasonable to use in the decision to withhold, postpone, or initiate statin therapy in intermediate-risk or selected borderline-risk asymptomatic adults (age 26-75 years and LDL-C >=70 to <190 mg/dL) who do not have diabetes or established atherosclerotic cardiovascular disease (ASCVD).* In intermediate-risk (10-year ASCVD risk >=7.5% to <20%) adults or selected borderline-risk (10-year ASCVD risk >=5% to <7.5%) adults in whom a CAC score is measured for the purpose of making a treatment decision the following recommendations have been made: If CAC = 0, it is reasonable to withhold statin therapy and reassess in 5 to 10 years, as long as higher risk conditions are absent (diabetes mellitus, family history of premature CHD in first degree relatives (males <55 years; females <65 years), cigarette smoking, LDL >=190 mg/dL or other independent risk factors). If CAC is 1 to 99, it is reasonable to initiate statin therapy for patients >=43 years of age. If CAC is >=100 or >=75th percentile, it is reasonable to initiate statin therapy at any age. Cardiology  referral should be considered for patients with CAC scores >=400 or >=75th percentile. *2018 AHA/ACC/AACVPR/AAPA/ABC/ACPM/ADA/AGS/APhA/ASPC/NLA/PCNA Guideline on the Management of Blood Cholesterol: A Report of the American College of Cardiology/American Heart Association Task Force on Clinical Practice Guidelines. J Am Coll Cardiol. 2019;73(24):3168-3209. Mahesh  Chandrasekhar Electronically Signed   By: Riley Lam M.D.   On: 12/26/2021 11:07  ? ?Result Date: 12/26/2021 ?EXAM: OVER-READ INTERPRETATION  CT CHEST The following report is an over-read performed by radiologist Dr. Trudie Reed of [facility] on 12/25/2021. This over-read does not  include interpretation of cardiac or coronary anatomy or pathology. The coronary calcium score/coronary CTA interpretation by the cardiologist is attached. COMPARISON:  Chest CTA 12/13/2021. FINDINGS: Extracardiac findings will

## 2022-01-23 NOTE — TOC Transition Note (Addendum)
Transition of Care (TOC) - CM/SW Discharge Note ?Donn Pierini Charity fundraiser, BSN ?Transitions of Care ?Unit 4E- RN Case Manager ?See Treatment Team for direct phone #  ? ? ?Patient Details  ?Name: Marcus Powers ?MRN: [id number] ?Date of Birth: 1928-07-30 ? ?Transition of Care (TOC) CM/SW Contact:  ?Zenda Alpers, Lenn Sink, RN ?Phone Number: ?01/23/2022, 10:35 AM ? ? ?Clinical Narrative:    ?Pt stable for transition home today. Lives with spouse and daughter, has rolling walker at home.  Noted plans for pt to return next week for TAVR. ? ? ?Final next level of care: Home/Self Care ?Barriers to Discharge: No Barriers Identified ? ? ?Patient Goals and CMS Choice ?  ?  ?Choice offered to / list presented to : NA ? ?Discharge Placement ?  ?           ? Home ?  ?  ?  ? ?Discharge Plan and Services ?  ?  ?Post Acute Care Choice: NA          ?  ?  ?  ?  ?  ?  ?  ?  ?  ?  ? ?Social Determinants of Health (SDOH) Interventions ?  ? ? ?Readmission Risk Interventions ? ?  01/23/2022  ? 10:35 AM  ?Readmission Risk Prevention Plan  ?Transportation Screening Complete  ?PCP or Specialist Appt within 5-7 Days Complete  ?Home Care Screening Complete  ?Medication Review (RN CM) Complete  ? ? ? ? ? ?

## 2022-01-23 NOTE — Progress Notes (Signed)
Occupational Therapy Treatment ?Patient Details ?Name: Marcus Powers ?MRN: [id number] ?DOB: Nov 24, 1927 ?Today's Date: 01/23/2022 ? ? ?History of present illness Pt is a 88 y.o. M who presents 4/18 with  acute respiratory failure with hypoxia secondary to symptomatic severe aortic stenosis/acute on chronic diastolic CHF exacerbation. Initially requiring BiPAP now transtiion to nasal cannula. Awaiting TAVR. Significant PMH: hypertension, hyperlipidemia, severe aortic stenosis, moderate aortic insufficiency, chronic diastolic heart failure, spinal stenosis. ?  ?OT comments ? Pt is progressing towards OT goals. Impulsive and required mod cues for safety with RW "This is how I do it at home" Able to ambulate to bathroom min guard assist, perform peri care sit<>stand with set up, then progressed to sponge bathe himself at the sink with min A for back. Pt left in chair with chair alarm set, feet up - RN aware. He is hopeful to go home today. HHOT post-acute remains appropriate plan at this time.   ? ?Recommendations for follow up therapy are one component of a multi-disciplinary discharge planning process, led by the attending physician.  Recommendations may be updated based on patient status, additional functional criteria and insurance authorization. ?   ?Follow Up Recommendations ? Home health OT  ?  ?Assistance Recommended at Discharge Intermittent Supervision/Assistance  ?Patient can return home with the following ? A little help with bathing/dressing/bathroom;A little help with walking and/or transfers;Assistance with cooking/housework;Direct supervision/assist for medications management;Direct supervision/assist for financial management ?  ?Equipment Recommendations ? Tub/shower seat  ?  ?Recommendations for Other Services   ? ?  ?Precautions / Restrictions Precautions ?Precautions: Fall ?Restrictions ?Weight Bearing Restrictions: No  ? ? ?  ? ?Mobility Bed Mobility ?  ?  ?  ?  ?  ?  ?  ?General bed mobility comments:  OOB with PA when OT walked in ?  ? ?Transfers ?Overall transfer level: Needs assistance ?Equipment used: Rolling walker (2 wheels) ?Transfers: Sit to/from Stand ?Sit to Stand: Min guard, Min assist ?  ?  ?  ?  ?  ?General transfer comment: cues for safety with RW- min A for balance ?  ?  ?Balance Overall balance assessment: Needs assistance ?Sitting-balance support: Feet supported ?Sitting balance-Leahy Scale: Good ?  ?  ?Standing balance support: Bilateral upper extremity supported, During functional activity, Reliant on assistive device for balance ?Standing balance-Leahy Scale: Poor ?  ?  ?  ?  ?  ?  ?  ?  ?  ?  ?  ?  ?   ? ?ADL either performed or assessed with clinical judgement  ? ?ADL Overall ADL's : Needs assistance/impaired ?  ?  ?Grooming: Wash/dry hands;Wash/dry face;Sitting;Applying deodorant ?Grooming Details (indicate cue type and reason): sitting at sink ?Upper Body Bathing: Minimal assistance;Sitting ?Upper Body Bathing Details (indicate cue type and reason): at sink for back ?Lower Body Bathing: Min guard;Sitting/lateral leans ?Lower Body Bathing Details (indicate cue type and reason): sitting at sink ?Upper Body Dressing : Supervision/safety;Sitting ?Upper Body Dressing Details (indicate cue type and reason): donning new gown ?Lower Body Dressing: Min guard;Sitting/lateral leans;Sit to/from stand ?  ?Toilet Transfer: Min guard;Minimal assistance;Ambulation;Rolling walker (2 wheels);BSC/3in1 ?Toilet Transfer Details (indicate cue type and reason): into bathroom, uses grab bars, cues for safety with RW ?Toileting- Architect and Hygiene: Min guard;Sit to/from stand ?Toileting - Clothing Manipulation Details (indicate cue type and reason): uses grab bars for sit<>stand ?  ?  ?Functional mobility during ADLs: Min guard;Minimal assistance;Rolling walker (2 wheels);Cueing for safety ?  ?  ? ?Extremity/Trunk Assessment   ?  ?  ?  ?  ?  ? ?  Vision   ?  ?  ?Perception   ?  ?Praxis   ?   ? ?Cognition Arousal/Alertness: Awake/alert ?Behavior During Therapy: Impulsive ?Overall Cognitive Status: No family/caregiver present to determine baseline cognitive functioning ?Area of Impairment: Safety/judgement ?  ?  ?  ?  ?  ?  ?  ?  ?  ?  ?  ?  ?Safety/Judgement: Decreased awareness of safety, Decreased awareness of deficits ?  ?  ?General Comments: cues fro safety with RW, not waiting for therapist ?  ?  ?   ?Exercises   ? ?  ?Shoulder Instructions   ? ? ?  ?General Comments    ? ? ?Pertinent Vitals/ Pain       Pain Assessment ?Pain Assessment: No/denies pain ? ?Home Living   ?  ?  ?  ?  ?  ?  ?  ?  ?  ?  ?  ?  ?  ?  ?  ?  ?  ?  ? ?  ?Prior Functioning/Environment    ?  ?  ?  ?   ? ?Frequency ? Min 2X/week  ? ? ? ? ?  ?Progress Toward Goals ? ?OT Goals(current goals can now be found in the care plan section) ? Progress towards OT goals: Progressing toward goals ? ?Acute Rehab OT Goals ?Patient Stated Goal: get home ?OT Goal Formulation: With patient ?Time For Goal Achievement: 02/04/22 ?Potential to Achieve Goals: Good  ?Plan Discharge plan remains appropriate;Frequency remains appropriate   ? ?Co-evaluation ? ? ?   ?  ?  ?  ?  ? ?  ?AM-PAC OT "6 Clicks" Daily Activity     ?Outcome Measure ? ? Help from another person eating meals?: None ?Help from another person taking care of personal grooming?: A Little ?Help from another person toileting, which includes using toliet, bedpan, or urinal?: A Little ?Help from another person bathing (including washing, rinsing, drying)?: A Little ?Help from another person to put on and taking off regular upper body clothing?: None ?Help from another person to put on and taking off regular lower body clothing?: A Little ?6 Click Score: 20 ? ?  ?End of Session Equipment Utilized During Treatment: Gait belt;Rolling walker (2 wheels) ? ?OT Visit Diagnosis: Muscle weakness (generalized) (M62.81);Unsteadiness on feet (R26.81) ?  ?Activity Tolerance Patient tolerated treatment  well ?  ?Patient Left in chair;with call bell/phone within reach;with chair alarm set ?  ?Nurse Communication Mobility status ?  ? ?   ? ?Time: 6283-1517 ?OT Time Calculation (min): 26 min ? ?Charges: OT General Charges ?$OT Visit: 1 Visit ?OT Treatments ?$Self Care/Home Management : 23-37 mins ?Nyoka Cowden OTR/L ?Acute Rehabilitation Services ?Pager: [phone] ?Office: [phone] ? ? ?Evern Bio Xandria Gallaga ?01/23/2022, 11:17 AM ?

## 2022-01-23 NOTE — Progress Notes (Signed)
Nutrition Education Note ? ?RD consulted for nutrition education regarding a Heart Healthy diet.  ? ?RD provided "Heart Healthy Nutrition Therapy" handout from the Academy of [employer] and Dietetics. Discouraged intake of processed foods and use of salt shaker. Encouraged fresh fruits and vegetables as well as whole grain sources of carbohydrates to maximize fiber intake. Patient states that his wife and daughter "take care of" his diet. He was appreciative of the information provided and plans to share the handouts with his wife and daughter.  ? ?Expect fair to good compliance. ? ?Body mass index is 24.23 kg/m?Marland Kitchen Pt meets criteria for normal weight based on current BMI. ? ?Current diet order is heart healthy, patient is consuming approximately 75% of meals at this time. Labs and medications reviewed. No further nutrition interventions warranted at this time. RD contact information provided. If additional nutrition issues arise, please re-consult RD. Plans for discharge home today. ? ?Gabriel Rainwater RD, LDN, CNSC ?Please refer to Amion for contact information.                                                       ? ? ?

## 2022-01-23 NOTE — Progress Notes (Signed)
Pre Surgical Assessment: 5 M Walk Test ? ?82M=16.15ft ? ?5 Meter Walk Test- trial 1: 22 seconds ?5 Meter Walk Test- trial 2: 21 seconds ?5 Meter Walk Test- trial 3: 23 seconds ?5 Meter Walk Test Average: 22 seconds ? ? ?

## 2022-01-23 NOTE — Progress Notes (Signed)
Explained discharge instructions. Reviewed follow up appointments, next medication administration times to patient's daughter Nettie Elm. Both patient and daughter verbalized having full understanding of discharge instructions. Assisted with dressing and transported the patient downstairs for discharge. Patient was already removed from telemetry monitoring by staff before explaining his discharge instructions.  ?

## 2022-01-23 NOTE — Progress Notes (Signed)
Euvolemic. Okay to discharge. Planned TAVR next week. ? ? ?Nigel Mormon, MD ?Pager: [phone] ?Office: [phone] ? ?

## 2022-01-23 NOTE — Plan of Care (Signed)
?  Problem: Education: ?Goal: Ability to demonstrate management of disease process will improve ?Outcome: Adequate for Discharge ?Goal: Ability to verbalize understanding of medication therapies will improve ?Outcome: Adequate for Discharge ?Goal: Individualized Educational Video(s) ?Outcome: Adequate for Discharge ?  ?Problem: Activity: ?Goal: Capacity to carry out activities will improve ?Outcome: Adequate for Discharge ?  ?Problem: Cardiac: ?Goal: Ability to achieve and maintain adequate cardiopulmonary perfusion will improve ?Outcome: Adequate for Discharge ?  ?Problem: Education: ?Goal: Knowledge of General Education information will improve ?Description: Including pain rating scale, medication(s)/side effects and non-pharmacologic comfort measures ?Outcome: Adequate for Discharge ?  ?Problem: Health Behavior/Discharge Planning: ?Goal: Ability to manage health-related needs will improve ?Outcome: Adequate for Discharge ?  ?Problem: Clinical Measurements: ?Goal: Ability to maintain clinical measurements within normal limits will improve ?Outcome: Adequate for Discharge ?Goal: Will remain free from infection ?Outcome: Adequate for Discharge ?Goal: Diagnostic test results will improve ?Outcome: Adequate for Discharge ?Goal: Respiratory complications will improve ?Outcome: Adequate for Discharge ?Goal: Cardiovascular complication will be avoided ?Outcome: Adequate for Discharge ?  ?Problem: Nutrition: ?Goal: Adequate nutrition will be maintained ?Outcome: Adequate for Discharge ?  ?Problem: Elimination: ?Goal: Will not experience complications related to bowel motility ?Outcome: Adequate for Discharge ?Goal: Will not experience complications related to urinary retention ?Outcome: Adequate for Discharge ?  ?Problem: Pain Managment: ?Goal: General experience of comfort will improve ?Outcome: Adequate for Discharge ?  ?Problem: Safety: ?Goal: Ability to remain free from injury will improve ?Outcome: Adequate for  Discharge ?  ?Problem: Skin Integrity: ?Goal: Risk for impaired skin integrity will decrease ?Outcome: Adequate for Discharge ?  ?

## 2022-01-24 ENCOUNTER — Other Ambulatory Visit: Payer: Self-pay

## 2022-01-24 ENCOUNTER — Ambulatory Visit (HOSPITAL_COMMUNITY)
Admission: RE | Admit: 2022-01-24 | Discharge: 2022-01-24 | Disposition: A | Discharge status: Home / Self Care | Payer: Medicare Other | Source: Ambulatory Visit | Attending: Cardiovascular Disease | Admitting: Cardiovascular Disease

## 2022-01-24 ENCOUNTER — Encounter (HOSPITAL_COMMUNITY): Payer: Self-pay

## 2022-01-24 ENCOUNTER — Other Ambulatory Visit (HOSPITAL_COMMUNITY): Payer: Self-pay

## 2022-01-24 ENCOUNTER — Encounter (HOSPITAL_COMMUNITY)
Admit: 2022-01-24 | Discharge: 2022-01-24 | Disposition: A | Discharge status: Home / Self Care | Payer: Medicare Other | Attending: Cardiovascular Disease | Admitting: Cardiovascular Disease

## 2022-01-24 VITALS — BP 120/70 | HR 109 | Temp 98.2°F | Resp 17 | Ht 66.0 in | Wt 146.0 lb

## 2022-01-24 DIAGNOSIS — J9811 Atelectasis: Secondary | ICD-10-CM | POA: Diagnosis not present

## 2022-01-24 DIAGNOSIS — I350 Nonrheumatic aortic (valve) stenosis: Secondary | ICD-10-CM

## 2022-01-24 DIAGNOSIS — Z01818 Encounter for other preprocedural examination: Secondary | ICD-10-CM | POA: Diagnosis not present

## 2022-01-24 DIAGNOSIS — Z20822 Contact with and (suspected) exposure to covid-19: Secondary | ICD-10-CM | POA: Insufficient documentation

## 2022-01-24 LAB — PROTIME-INR
INR: 1 (ref 0.8–1.2)
Prothrombin Time: 12.8 seconds (ref 11.4–15.2)

## 2022-01-24 LAB — TYPE AND SCREEN
ABO/RH(D): AB POS
Antibody Screen: NEGATIVE

## 2022-01-24 LAB — COMPREHENSIVE METABOLIC PANEL
ALT: 23 U/L (ref 0–44)
AST: 26 U/L (ref 15–41)
Albumin: 4.4 g/dL (ref 3.5–5.0)
Alkaline Phosphatase: 69 U/L (ref 38–126)
Anion gap: 12 (ref 5–15)
BUN: 29 mg/dL — ABNORMAL HIGH (ref 8–23)
CO2: 28 mmol/L (ref 22–32)
Calcium: 9.7 mg/dL (ref 8.9–10.3)
Chloride: 99 mmol/L (ref 98–111)
Creatinine, Ser: 1.56 mg/dL — ABNORMAL HIGH (ref 0.61–1.24)
GFR, Estimated: 42 mL/min — ABNORMAL LOW (ref >60)
Glucose, Bld: 151 mg/dL — ABNORMAL HIGH (ref 70–99)
Potassium: 3.7 mmol/L (ref 3.5–5.1)
Sodium: 139 mmol/L (ref 135–145)
Total Bilirubin: 1.4 mg/dL — ABNORMAL HIGH (ref 0.3–1.2)
Total Protein: 7.9 g/dL (ref 6.5–8.1)

## 2022-01-24 LAB — SURGICAL PCR SCREEN
MRSA, PCR: NEGATIVE
Staphylococcus aureus: NEGATIVE

## 2022-01-24 NOTE — Progress Notes (Addendum)
PCP - Lavone Orn MD ?Cardiologist - Nigel Mormon MD ? ?PPM/ICD -denies  ?Device Orders -  ?Rep Notified -  ? ?Chest x-ray - 01/24/22 ?EKG - 01/22/20 ?Stress Test - none ?ECHO - 12/13/21 ?Cardiac Cath - 12/13/21 ? ?Sleep Study - no ?CPAP -  ? ?Fasting Blood Sugar - na ?Checks Blood Sugar _____ times a day ? ?Blood Thinner Instructions:n/a ?Aspirin Instructions:n/a ? ?ERAS Protcol -no ?PRE-SURGERY Ensure or G2-  ? ?COVID TEST- 01/24/22 ? ? ?Anesthesia review: yes-recent hospitalization ? ?Patient denies shortness of breath, fever, cough and chest pain at PAT appointment ? ? ?All instructions explained to the patient, with a verbal understanding of the material. Patient agrees to go over the instructions while at home for a better understanding. Patient also instructed to wear a mask when out in public after being tested for COVID-19. The opportunity to ask questions was provided. ?  ?

## 2022-01-25 LAB — SARS CORONAVIRUS 2 (TAT 6-24 HRS): SARS Coronavirus 2: NEGATIVE

## 2022-01-27 MED ORDER — NOREPINEPHRINE 4 MG/250ML-% IV SOLN
0.0000 ug/min | INTRAVENOUS | Status: AC
  Administered 2022-01-28: 1 ug/min via INTRAVENOUS
  Filled 2022-01-27: qty 250

## 2022-01-27 MED ORDER — HEPARIN 30,000 UNITS/1000 ML (OHS) CELLSAVER SOLUTION
Status: DC
  Filled 2022-01-27: qty 1000

## 2022-01-27 MED ORDER — MAGNESIUM SULFATE 50 % IJ SOLN
40.0000 meq | INTRAMUSCULAR | Status: DC
Start: 2022-01-28 — End: 2022-01-28
  Filled 2022-01-27: qty 9.85

## 2022-01-27 MED ORDER — DEXMEDETOMIDINE HCL IN NACL 400 MCG/100ML IV SOLN
0.1000 ug/kg/h | INTRAVENOUS | Status: DC
  Filled 2022-01-27: qty 100

## 2022-01-27 MED ORDER — CEFAZOLIN SODIUM-DEXTROSE 2-4 GM/100ML-% IV SOLN
2.0000 g | INTRAVENOUS | Status: AC
  Administered 2022-01-28: 2 g via INTRAVENOUS
  Filled 2022-01-27: qty 100

## 2022-01-27 MED ORDER — POTASSIUM CHLORIDE 2 MEQ/ML IV SOLN
80.0000 meq | INTRAVENOUS | Status: DC
  Filled 2022-01-27: qty 40

## 2022-01-27 NOTE — H&P (Signed)
? ?   ?[address] ?      [place] [id number] ?            [phone]   ? ?  ?Cardiothoracic Surgery Admission History and Physical ? ? ?PCP is Kirby Funk, MD ?Referring Provider is Earney Hamburg, MD ?Primary Cardiologist is Truett Mainland, MD ?  ?Reason for admission:  Severe aortic stenosis ?  ?HPI: ?  ?The patient is a 88 year old gentleman with a history of hypertension, hyperlipidemia, aortic stenosis and chronic diastolic congestive heart failure, who has been followed by Dr. Rosemary Holms.  He was admitted to the hospital in May 2022 with shortness of breath and found to have a BNP greater than 800.  An echocardiogram at that time show severe aortic stenosis with a mean gradient of 47.2 mmHg and a valve area of 0.9 cm? by VTI.  Ejection fraction is 50 to 55% with mild mitral regurgitation.  He was treated with intravenous Lasix with symptom resolution.  He was seen by Dr. Rosemary Holms in July 2022 and was doing well and still mowing his yard and doing things around the house without shortness of breath or chest discomfort.  There was apparently some discussion about referring him for TAVR work-up but he was reluctant.  Plans were made for repeat echocardiogram in a few months.  He had an echocardiogram in October 2022 showing a trileaflet aortic valve with a mean gradient of 38 mmHg and a valve area of 0.6 cm? with a dimensionless index of 0.17 consistent with severe aortic stenosis.  There was moderate aortic insufficiency and moderate mitral regurgitation.  Left ventricular ejection fraction was 55% with moderate concentric LVH.  He continued to feel well and did not want to consider TAVR at that time because he was asymptomatic.  He now presents with worsening exertional shortness of breath and fatigue.  He has had 2 admissions in the past few weeks with severe shortness of breath occurring at rest.  He was admitted in early March after developing severe shortness of breath at rest  sitting on his couch with his wife.  When he went to stand up his legs were weak and he became severely short of breath.  EMS was called and his oxygen saturations were noted to be 85% on room air.  On arrival to [facility] his BNP was 960.  High-sensitivity troponin was 102 and 105.  He was treated with intravenous Lasix with a large amount of urine output and improvement in his symptoms.  He underwent cardiac catheterization showing no obstructive coronary disease.  Echocardiogram on 12/13/2021 showed a severely calcified aortic valve with a mean gradient of 35 mmHg and a valve area of 0.39 cm?.  There was mild to moderate aortic insufficiency with a pressure half-time of 315 ms.  He was sent home with plans for TAVR in May but is now admitted yesterday with another episode of severe shortness of breath at rest.  He denies any chest pain or pressure.  He has had orthopnea.  He has had peripheral edema.  He has had no dizziness or syncope.  He has been diuresed with improvement in his symptoms and he is currently off oxygen. ?  ?    ?Past Medical History:  ?Diagnosis Date  ? Aortic stenosis    ? Chronic diastolic heart failure (HCC)    ? Hyperlipidemia    ? Hypertension    ? Spinal stenosis    ?  ?  ?     ?  Past Surgical History:  ?Procedure Laterality Date  ? BIOPSY BREAST      ? RIGHT HEART CATH AND CORONARY ANGIOGRAPHY N/A 12/13/2021  ?  Procedure: RIGHT HEART CATH AND CORONARY ANGIOGRAPHY;  Surgeon: Elder Negus, MD;  Location: [facility];  Service: Cardiovascular;  Laterality: N/A;  ?  ?  ?     ?Family History  ?Problem Relation Age of Onset  ? Cancer Mother    ? Stroke Father    ?  ?  ?Social History  ?  ?     ?Socioeconomic History  ? Marital status: Married  ?    Spouse name: Not on file  ? Number of children: Not on file  ? Years of education: Not on file  ? Highest education level: Not on file  ?Occupational History  ? Not on file  ?Tobacco Use  ? Smoking status: Former  ?    Packs/day: 0.25   ?    Years: 5.00  ?    Pack years: 1.25  ?    Types: Cigarettes  ?    Quit date: 1969  ?    Years since quitting: 54.3  ? Smokeless tobacco: Never  ?Vaping Use  ? Vaping Use: Never used  ?Substance and Sexual Activity  ? Alcohol use: No  ? Drug use: No  ? Sexual activity: Not on file  ?Other Topics Concern  ? Not on file  ?Social History Narrative  ? Not on file  ?  ?Social Determinants of Health  ?  ?Financial Resource Strain: Not on file  ?Food Insecurity: Not on file  ?Transportation Needs: Not on file  ?Physical Activity: Not on file  ?Stress: Not on file  ?Social Connections: Not on file  ?Intimate Partner Violence: Not on file  ?  ?  ?       ?Prior to Admission medications   ?Medication Sig Start Date End Date Taking? Authorizing Provider  ?acetaminophen (TYLENOL) 650 MG CR tablet Take 650 mg by mouth daily.     Yes [provider]  ?furosemide (LASIX) 40 MG tablet Take 1 tablet (40 mg total) by mouth 2 (two) times daily. ?Patient taking differently: Take 40 mg by mouth daily. 01/19/22   Yes Kommor, Madison, MD  ?losartan (COZAAR) 100 MG tablet Take 100 mg by mouth daily. 12/31/20   Yes [provider]  ?pravastatin (PRAVACHOL) 20 MG tablet Take 20 mg by mouth daily. 12/31/20   Yes [provider]  ?Pseudoeph-Doxylamine-DM-APAP (NYQUIL PO) Take 1 Dose by mouth daily as needed (cough).     Yes [provider]  ?amLODipine (NORVASC) 5 MG tablet Take 5 mg by mouth daily.       [provider]  ?  ?  ?         ?Current Facility-Administered Medications  ?Medication Dose Route Frequency Provider Last Rate Last Admin  ? 0.9 %  sodium chloride infusion  250 mL Intravenous PRN Rodolph Bong, MD      ? acetaminophen (TYLENOL) tablet 650 mg  650 mg Oral Q4H PRN Rodolph Bong, MD      ? enoxaparin (LOVENOX) injection 40 mg  40 mg Subcutaneous Q24H Rodolph Bong, MD   40 mg at 01/22/22 1709  ? ondansetron (ZOFRAN) injection 4 mg  4 mg Intravenous Q6H PRN Rodolph Bong, MD      ? pravastatin (PRAVACHOL) tablet 20 mg  20 mg Oral Daily Rodolph Bong, MD  20 mg at 01/22/22 0955  ? sodium chloride flush (NS) 0.9 % injection 3 mL  3 mL Intravenous Q12H Rodolph Bong, MD   3 mL at 01/22/22 0834  ? sodium chloride flush (NS) 0.9 % injection 3 mL  3 mL Intravenous PRN Rodolph Bong, MD      ? torsemide [facility]) tablet 20 mg  20 mg Oral BID Alberteen Sam, MD   20 mg at 01/22/22 1714  ?  ?  ?    ?Allergies  ?Allergen Reactions  ? Benazepril Hcl Cough  ?  ?  ?  ?  ?Review of Systems: ?  ?            General:                      normal appetite, + decreased energy, no weight gain, no weight loss, no fever ?            Cardiac:                       no chest pain with exertion, no chest pain at rest, +SOB with mild exertion, + resting SOB, no PND, + orthopnea, no palpitations, no arrhythmia, no atrial fibrillation, + LE edema, no dizzy spells, no syncope ?            Respiratory:                 + shortness of breath, no home oxygen, no productive cough, no dry cough, no bronchitis, no wheezing, no hemoptysis, no asthma, no pain with inspiration or cough, no sleep apnea, no CPAP at night ?            GI:                               no difficulty swallowing, no reflux, no frequent heartburn, no hiatal hernia, no abdominal pain, no constipation, no diarrhea, no hematochezia, no hematemesis, no melena ?            GU:                              no dysuria,  no frequency, no urinary tract infection, no hematuria, no enlarged prostate, no kidney stones, no kidney disease ?            Vascular:                     no pain suggestive of claudication, no pain in feet, no leg cramps, no varicose veins, no DVT, no non-healing foot ulcer ?            Neuro:                         no stroke, no TIA's, no seizures, no headaches, no temporary blindness one eye,  no slurred speech, no peripheral neuropathy, no chronic pain, + instability of gait, no memory/cognitive  dysfunction ?            Musculoskeletal:         no arthritis, no joint swelling, no myalgias, + difficulty walking, + decreased mobility  ?            Skin:  no rash, no itchi

## 2022-01-27 NOTE — Anesthesia Preprocedure Evaluation (Addendum)
Anesthesia Evaluation  ?Patient identified by MRN, date of birth, ID band ?Patient awake ? ? ? ?Reviewed: ?Allergy & Precautions, H&P , NPO status , Patient's Chart, lab work & pertinent test results ? ?Airway ?Mallampati: II ? ?TM Distance: >3 FB ?Neck ROM: Full ? ? ? Dental ?no notable dental hx. ?(+) Partial Lower, Partial Upper, Dental Advisory Given ?  ?Pulmonary ?neg pulmonary ROS, former smoker,  ?  ?Pulmonary exam normal ?breath sounds clear to auscultation ? ? ? ? ? ? Cardiovascular ?Exercise Tolerance: Good ?hypertension, + Valvular Problems/Murmurs AS  ?Rhythm:Regular Rate:Normal ?+ Systolic murmurs ? ?  ?Neuro/Psych ?negative neurological ROS ? negative psych ROS  ? GI/Hepatic ?negative GI ROS, Neg liver ROS,   ?Endo/Other  ?negative endocrine ROS ? Renal/GU ?negative Renal ROS  ?negative genitourinary ?  ?Musculoskeletal ? ? Abdominal ?  ?Peds ? Hematology ? ?(+) Blood dyscrasia, anemia ,   ?Anesthesia Other Findings ? ? Reproductive/Obstetrics ?negative OB ROS ? ?  ? ? ? ? ? ? ? ? ? ? ? ? ? ?  ?  ? ? ? ? ? ? ? ?Anesthesia Physical ?Anesthesia Plan ? ?ASA: 4 ? ?Anesthesia Plan: General  ? ?Post-op Pain Management: Tylenol PO (pre-op)* and Minimal or no pain anticipated  ? ?Induction: Intravenous ? ?PONV Risk Score and Plan: 2 and Propofol infusion and Ondansetron ? ?Airway Management Planned: Oral ETT ? ?Additional Equipment: Arterial line ? ?Intra-op Plan:  ? ?Post-operative Plan: Extubation in OR ? ?Informed Consent: I have reviewed the patients History and Physical, chart, labs and discussed the procedure including the risks, benefits and alternatives for the proposed anesthesia with the patient or authorized representative who has indicated his/her understanding and acceptance.  ? ? ? ?Dental advisory given ? ?Plan Discussed with: CRNA ? ?Anesthesia Plan Comments:   ? ? ? ? ? ?Anesthesia Quick Evaluation ? ?

## 2022-01-28 ENCOUNTER — Inpatient Hospital Stay (HOSPITAL_COMMUNITY): Payer: Medicare Other | Admitting: Certified Registered Nurse Anesthetist

## 2022-01-28 ENCOUNTER — Inpatient Hospital Stay (HOSPITAL_COMMUNITY)
Admission: RE | Admit: 2022-01-28 | Discharge: 2022-01-28 | Disposition: A | Discharge status: Home / Self Care | Payer: Medicare Other | Source: Ambulatory Visit | Attending: Cardiovascular Disease | Admitting: Cardiovascular Disease

## 2022-01-28 ENCOUNTER — Inpatient Hospital Stay (HOSPITAL_COMMUNITY)
Admission: RE | Admit: 2022-01-28 | Discharge: 2022-01-29 | DRG: 267 | Disposition: A | Discharge status: Home / Self Care | Payer: Medicare Other | Source: Ambulatory Visit | Attending: Cardiovascular Disease | Admitting: Cardiovascular Disease

## 2022-01-28 ENCOUNTER — Other Ambulatory Visit: Payer: Self-pay

## 2022-01-28 ENCOUNTER — Encounter (HOSPITAL_COMMUNITY): Payer: Self-pay | Admitting: Cardiovascular Disease

## 2022-01-28 ENCOUNTER — Other Ambulatory Visit: Payer: Self-pay | Admitting: Physician Assistant

## 2022-01-28 ENCOUNTER — Encounter (HOSPITAL_COMMUNITY)
Admission: RE | Disposition: A | Discharge status: Home / Self Care | Payer: Medicare Other | Source: Ambulatory Visit | Attending: Cardiovascular Disease

## 2022-01-28 DIAGNOSIS — E876 Hypokalemia: Secondary | ICD-10-CM | POA: Diagnosis present

## 2022-01-28 DIAGNOSIS — I350 Nonrheumatic aortic (valve) stenosis: Secondary | ICD-10-CM

## 2022-01-28 DIAGNOSIS — I10 Essential (primary) hypertension: Secondary | ICD-10-CM | POA: Diagnosis not present

## 2022-01-28 DIAGNOSIS — D649 Anemia, unspecified: Secondary | ICD-10-CM

## 2022-01-28 DIAGNOSIS — E785 Hyperlipidemia, unspecified: Secondary | ICD-10-CM | POA: Diagnosis present

## 2022-01-28 DIAGNOSIS — Z006 Encounter for examination for normal comparison and control in clinical research program: Secondary | ICD-10-CM

## 2022-01-28 DIAGNOSIS — I130 Hypertensive heart and chronic kidney disease with heart failure and stage 1 through stage 4 chronic kidney disease, or unspecified chronic kidney disease: Secondary | ICD-10-CM | POA: Diagnosis present

## 2022-01-28 DIAGNOSIS — I5033 Acute on chronic diastolic (congestive) heart failure: Secondary | ICD-10-CM | POA: Diagnosis present

## 2022-01-28 DIAGNOSIS — I352 Nonrheumatic aortic (valve) stenosis with insufficiency: Secondary | ICD-10-CM | POA: Diagnosis not present

## 2022-01-28 DIAGNOSIS — N179 Acute kidney failure, unspecified: Secondary | ICD-10-CM | POA: Diagnosis present

## 2022-01-28 DIAGNOSIS — Z952 Presence of prosthetic heart valve: Principal | ICD-10-CM

## 2022-01-28 DIAGNOSIS — Z87891 Personal history of nicotine dependence: Secondary | ICD-10-CM

## 2022-01-28 DIAGNOSIS — Z79899 Other long term (current) drug therapy: Secondary | ICD-10-CM | POA: Diagnosis not present

## 2022-01-28 DIAGNOSIS — Z823 Family history of stroke: Secondary | ICD-10-CM | POA: Diagnosis not present

## 2022-01-28 DIAGNOSIS — N1832 Chronic kidney disease, stage 3b: Secondary | ICD-10-CM | POA: Diagnosis not present

## 2022-01-28 DIAGNOSIS — I340 Nonrheumatic mitral (valve) insufficiency: Secondary | ICD-10-CM | POA: Diagnosis present

## 2022-01-28 DIAGNOSIS — Z888 Allergy status to other drugs, medicaments and biological substances status: Secondary | ICD-10-CM

## 2022-01-28 DIAGNOSIS — Z809 Family history of malignant neoplasm, unspecified: Secondary | ICD-10-CM | POA: Diagnosis not present

## 2022-01-28 DIAGNOSIS — M4800 Spinal stenosis, site unspecified: Secondary | ICD-10-CM | POA: Diagnosis not present

## 2022-01-28 DIAGNOSIS — I5032 Chronic diastolic (congestive) heart failure: Secondary | ICD-10-CM | POA: Diagnosis present

## 2022-01-28 HISTORY — PX: ULTRASOUND GUIDANCE FOR VASCULAR ACCESS: SHX6516

## 2022-01-28 HISTORY — DX: Nonrheumatic aortic (valve) stenosis: I35.0

## 2022-01-28 HISTORY — PX: TRANSCATHETER AORTIC VALVE REPLACEMENT, TRANSFEMORAL: SHX6400

## 2022-01-28 HISTORY — DX: Presence of prosthetic heart valve: Z95.2

## 2022-01-28 HISTORY — PX: TEE WITHOUT CARDIOVERSION: SHX5443

## 2022-01-28 LAB — POCT I-STAT 7, (LYTES, BLD GAS, ICA,H+H)
Acid-Base Excess: 5 mmol/L — ABNORMAL HIGH (ref 0.0–2.0)
Bicarbonate: 29.3 mmol/L — ABNORMAL HIGH (ref 20.0–28.0)
Calcium, Ion: 1.19 mmol/L (ref 1.15–1.40)
HCT: 38 % — ABNORMAL LOW (ref 39.0–52.0)
Hemoglobin: 12.9 g/dL — ABNORMAL LOW (ref 13.0–17.0)
O2 Saturation: 100 %
Potassium: 3.5 mmol/L (ref 3.5–5.1)
Sodium: 138 mmol/L (ref 135–145)
TCO2: 30 mmol/L (ref 22–32)
pCO2 arterial: 41.4 mmHg (ref 32–48)
pH, Arterial: 7.457 — ABNORMAL HIGH (ref 7.35–7.45)
pO2, Arterial: 338 mmHg — ABNORMAL HIGH (ref 83–108)

## 2022-01-28 LAB — ECHO TEE
AR max vel: 3.81 cm2
AV Area VTI: 3.72 cm2
AV Area mean vel: 3.68 cm2
AV Mean grad: 1.5 mmHg
AV Peak grad: 3.1 mmHg
Ao pk vel: 0.88 m/s
MV VTI: 2.31 cm2
P 1/2 time: 589 msec

## 2022-01-28 LAB — POCT I-STAT, CHEM 8
BUN: 37 mg/dL — ABNORMAL HIGH (ref 8–23)
BUN: 37 mg/dL — ABNORMAL HIGH (ref 8–23)
Calcium, Ion: 1.18 mmol/L (ref 1.15–1.40)
Calcium, Ion: 1.16 mmol/L (ref 1.15–1.40)
Chloride: 99 mmol/L (ref 98–111)
Chloride: 99 mmol/L (ref 98–111)
Creatinine, Ser: 1.9 mg/dL — ABNORMAL HIGH (ref 0.61–1.24)
Creatinine, Ser: 1.8 mg/dL — ABNORMAL HIGH (ref 0.61–1.24)
Glucose, Bld: 129 mg/dL — ABNORMAL HIGH (ref 70–99)
Glucose, Bld: 137 mg/dL — ABNORMAL HIGH (ref 70–99)
HCT: 38 % — ABNORMAL LOW (ref 39.0–52.0)
HCT: 36 % — ABNORMAL LOW (ref 39.0–52.0)
Hemoglobin: 12.9 g/dL — ABNORMAL LOW (ref 13.0–17.0)
Hemoglobin: 12.2 g/dL — ABNORMAL LOW (ref 13.0–17.0)
Potassium: 3.6 mmol/L (ref 3.5–5.1)
Potassium: 3.4 mmol/L — ABNORMAL LOW (ref 3.5–5.1)
Sodium: 139 mmol/L (ref 135–145)
Sodium: 139 mmol/L (ref 135–145)
TCO2: 31 mmol/L (ref 22–32)
TCO2: 28 mmol/L (ref 22–32)

## 2022-01-28 LAB — BLOOD GAS, ARTERIAL
Acid-Base Excess: 5.3 mmol/L — ABNORMAL HIGH (ref 0.0–2.0)
Bicarbonate: 31.1 mmol/L — ABNORMAL HIGH (ref 20.0–28.0)
O2 Saturation: 99.2 %
Patient temperature: 37
pCO2 arterial: 49 mmHg — ABNORMAL HIGH (ref 32–48)
pH, Arterial: 7.41 (ref 7.35–7.45)
pO2, Arterial: 107 mmHg (ref 83–108)

## 2022-01-28 LAB — ABO/RH: ABO/RH(D): AB POS

## 2022-01-28 SURGERY — IMPLANTATION, AORTIC VALVE, TRANSCATHETER, FEMORAL APPROACH
Anesthesia: General | Site: Groin

## 2022-01-28 MED ORDER — SODIUM CHLORIDE 0.9 % IV SOLN: 250.0000 mL | INTRAVENOUS | Status: DC | PRN

## 2022-01-28 MED ORDER — HEPARIN 6000 UNIT IRRIGATION SOLUTION
Status: DC | PRN
  Administered 2022-01-28: 3

## 2022-01-28 MED ORDER — FENTANYL CITRATE (PF) 250 MCG/5ML IJ SOLN
INTRAMUSCULAR | Status: AC
  Filled 2022-01-28: qty 5

## 2022-01-28 MED ORDER — ESMOLOL HCL 100 MG/10ML IV SOLN
INTRAVENOUS | Status: DC | PRN
  Administered 2022-01-28: 10 mg via INTRAVENOUS
  Administered 2022-01-28 (×2): 20 mg via INTRAVENOUS

## 2022-01-28 MED ORDER — MORPHINE SULFATE (PF) 2 MG/ML IV SOLN: 1.0000 mg | INTRAVENOUS | Status: DC | PRN

## 2022-01-28 MED ORDER — FENTANYL CITRATE (PF) 100 MCG/2ML IJ SOLN
INTRAMUSCULAR | Status: DC | PRN
  Administered 2022-01-28 (×2): 25 ug via INTRAVENOUS

## 2022-01-28 MED ORDER — CEFAZOLIN SODIUM-DEXTROSE 2-4 GM/100ML-% IV SOLN
2.0000 g | Freq: Three times a day (TID) | INTRAVENOUS | Status: AC
  Administered 2022-01-28 (×2): 2 g via INTRAVENOUS
  Filled 2022-01-28 (×2): qty 100

## 2022-01-28 MED ORDER — LACTATED RINGERS IV SOLN: INTRAVENOUS | Status: DC | PRN

## 2022-01-28 MED ORDER — SODIUM CHLORIDE 0.9 % IV SOLN
0.0125 ug/kg/min | Freq: Once | INTRAVENOUS | Status: AC
  Administered 2022-01-28: .1 ug/kg/min via INTRAVENOUS
  Filled 2022-01-28: qty 2000

## 2022-01-28 MED ORDER — LIDOCAINE 2% (20 MG/ML) 5 ML SYRINGE
INTRAMUSCULAR | Status: DC | PRN
  Administered 2022-01-28: 60 mg via INTRAVENOUS

## 2022-01-28 MED ORDER — PROTAMINE SULFATE 10 MG/ML IV SOLN
INTRAVENOUS | Status: AC
  Filled 2022-01-28: qty 25

## 2022-01-28 MED ORDER — ACETAMINOPHEN 500 MG PO TABS
1000.0000 mg | ORAL_TABLET | Freq: Once | ORAL | Status: DC
  Filled 2022-01-28: qty 2

## 2022-01-28 MED ORDER — ACETAMINOPHEN 325 MG PO TABS
650.0000 mg | ORAL_TABLET | Freq: Four times a day (QID) | ORAL | Status: DC | PRN
  Administered 2022-01-28: 650 mg via ORAL
  Filled 2022-01-28: qty 2

## 2022-01-28 MED ORDER — CHLORHEXIDINE GLUCONATE 4 % EX LIQD: 30.0000 mL | CUTANEOUS | Status: DC

## 2022-01-28 MED ORDER — OXYCODONE HCL 5 MG PO TABS: 5.0000 mg | ORAL_TABLET | ORAL | Status: DC | PRN

## 2022-01-28 MED ORDER — CHLORHEXIDINE GLUCONATE 0.12 % MT SOLN
15.0000 mL | Freq: Once | OROMUCOSAL | Status: AC
  Administered 2022-01-28: 15 mL via OROMUCOSAL
  Filled 2022-01-28: qty 15

## 2022-01-28 MED ORDER — NITROGLYCERIN IN D5W 200-5 MCG/ML-% IV SOLN
INTRAVENOUS | Status: AC
  Filled 2022-01-28: qty 250

## 2022-01-28 MED ORDER — PRAVASTATIN SODIUM 10 MG PO TABS
20.0000 mg | ORAL_TABLET | Freq: Every day | ORAL | Status: DC
  Administered 2022-01-29: 20 mg via ORAL
  Filled 2022-01-28: qty 2

## 2022-01-28 MED ORDER — SODIUM CHLORIDE 0.9% FLUSH: 3.0000 mL | INTRAVENOUS | Status: DC | PRN

## 2022-01-28 MED ORDER — SODIUM CHLORIDE 0.9 % IV SOLN: INTRAVENOUS | Status: DC

## 2022-01-28 MED ORDER — PROPOFOL 10 MG/ML IV BOLUS
INTRAVENOUS | Status: DC | PRN
  Administered 2022-01-28: 50 mg via INTRAVENOUS
  Administered 2022-01-28: 30 mg via INTRAVENOUS

## 2022-01-28 MED ORDER — PHENYLEPHRINE 80 MCG/ML (10ML) SYRINGE FOR IV PUSH (FOR BLOOD PRESSURE SUPPORT)
PREFILLED_SYRINGE | INTRAVENOUS | Status: DC | PRN
  Administered 2022-01-28: 160 ug via INTRAVENOUS

## 2022-01-28 MED ORDER — PROPOFOL 10 MG/ML IV BOLUS
INTRAVENOUS | Status: AC
  Filled 2022-01-28: qty 20

## 2022-01-28 MED ORDER — ACETAMINOPHEN 650 MG RE SUPP: 650.0000 mg | Freq: Four times a day (QID) | RECTAL | Status: DC | PRN

## 2022-01-28 MED ORDER — TRAMADOL HCL 50 MG PO TABS: 50.0000 mg | ORAL_TABLET | ORAL | Status: DC | PRN

## 2022-01-28 MED ORDER — HEPARIN SODIUM (PORCINE) 1000 UNIT/ML IJ SOLN
INTRAMUSCULAR | Status: DC | PRN
  Administered 2022-01-28: 11000 [IU] via INTRAVENOUS

## 2022-01-28 MED ORDER — ONDANSETRON HCL 4 MG/2ML IJ SOLN
INTRAMUSCULAR | Status: DC | PRN
Start: 2022-01-28 — End: 2022-01-28
  Administered 2022-01-28: 4 mg via INTRAVENOUS

## 2022-01-28 MED ORDER — SODIUM CHLORIDE 0.9% FLUSH
3.0000 mL | Freq: Two times a day (BID) | INTRAVENOUS | Status: DC
Start: 2022-01-28 — End: 2022-01-29
  Administered 2022-01-28 – 2022-01-29 (×2): 3 mL via INTRAVENOUS

## 2022-01-28 MED ORDER — ONDANSETRON HCL 4 MG/2ML IJ SOLN: 4.0000 mg | Freq: Four times a day (QID) | INTRAMUSCULAR | Status: DC | PRN

## 2022-01-28 MED ORDER — CLEVIDIPINE BUTYRATE 0.5 MG/ML IV EMUL
INTRAVENOUS | Status: DC | PRN
  Administered 2022-01-28: 2 mg/h via INTRAVENOUS

## 2022-01-28 MED ORDER — CHLORHEXIDINE GLUCONATE 4 % EX LIQD: 60.0000 mL | Freq: Once | CUTANEOUS | Status: DC

## 2022-01-28 MED ORDER — PROPOFOL 1000 MG/100ML IV EMUL
INTRAVENOUS | Status: AC
  Filled 2022-01-28: qty 100

## 2022-01-28 MED ORDER — ASPIRIN 81 MG PO CHEW
81.0000 mg | CHEWABLE_TABLET | Freq: Every day | ORAL | Status: DC
  Administered 2022-01-29: 81 mg via ORAL
  Filled 2022-01-28: qty 1

## 2022-01-28 MED ORDER — PROTAMINE SULFATE 10 MG/ML IV SOLN
INTRAVENOUS | Status: DC | PRN
  Administered 2022-01-28: 110 mg via INTRAVENOUS

## 2022-01-28 MED ORDER — NITROGLYCERIN IN D5W 200-5 MCG/ML-% IV SOLN
0.0000 ug/min | INTRAVENOUS | Status: DC
  Administered 2022-01-28: 10 ug/min via INTRAVENOUS

## 2022-01-28 MED ORDER — SODIUM CHLORIDE 0.9 % IV SOLN
INTRAVENOUS | Status: AC
  Administered 2022-01-28: 50 mL/h via INTRAVENOUS

## 2022-01-28 MED ORDER — SUGAMMADEX SODIUM 200 MG/2ML IV SOLN
INTRAVENOUS | Status: DC | PRN
  Administered 2022-01-28: 200 mg via INTRAVENOUS

## 2022-01-28 MED ORDER — ACETAMINOPHEN 325 MG PO TABS
ORAL_TABLET | ORAL | Status: AC
  Filled 2022-01-28: qty 1

## 2022-01-28 MED ORDER — IODIXANOL 320 MG/ML IV SOLN
INTRAVENOUS | Status: DC | PRN
Start: 2022-01-28 — End: 2022-01-28
  Administered 2022-01-28: 80.7 mL via INTRA_ARTERIAL

## 2022-01-28 MED ORDER — ROCURONIUM BROMIDE 10 MG/ML (PF) SYRINGE
PREFILLED_SYRINGE | INTRAVENOUS | Status: DC | PRN
  Administered 2022-01-28: 60 mg via INTRAVENOUS
  Administered 2022-01-28: 10 mg via INTRAVENOUS

## 2022-01-28 SURGICAL SUPPLY — 101 items
ADH SKN CLS APL DERMABOND .7 (GAUZE/BANDAGES/DRESSINGS) ×6
ADH SKN CLS LQ APL DERMABOND (GAUZE/BANDAGES/DRESSINGS) ×3
APL PRP STRL LF DISP 70% ISPRP (MISCELLANEOUS) ×9
BAG COUNTER SPONGE SURGICOUNT (BAG) ×5 IMPLANT
BAG DECANTER FOR FLEXI CONT (MISCELLANEOUS) IMPLANT
BAG SNAP BAND KOVER 36X36 (MISCELLANEOUS) ×5 IMPLANT
BAG SPNG CNTER NS LX DISP (BAG) ×3
BLADE CLIPPER SURG (BLADE) IMPLANT
BLADE STERNUM SYSTEM 6 (BLADE) IMPLANT
CABLE ADAPT CONN TEMP 6FT (ADAPTER) ×5 IMPLANT
CANNULA FEM VENOUS REMOTE 22FR (CANNULA) IMPLANT
CANNULA OPTISITE PERFUSION 16F (CANNULA) IMPLANT
CANNULA OPTISITE PERFUSION 18F (CANNULA) IMPLANT
CATH DIAG EXPO 6F AL1 (CATHETERS) IMPLANT
CATH DIAG EXPO 6F VENT PIG 145 (CATHETERS) ×10 IMPLANT
CATH EXTERNAL FEMALE PUREWICK (CATHETERS) IMPLANT
CATH INFINITI 6F AL2 (CATHETERS) ×1 IMPLANT
CATH S G BIP PACING (CATHETERS) ×5 IMPLANT
CHLORAPREP W/TINT 26 (MISCELLANEOUS) ×7 IMPLANT
CLIP VESOCCLUDE MED 24/CT (CLIP) IMPLANT
CLIP VESOCCLUDE SM WIDE 24/CT (CLIP) IMPLANT
CLOSURE MYNX CONTROL 6F/7F (Vascular Products) ×1 IMPLANT
CNTNR URN SCR LID CUP LEK RST (MISCELLANEOUS) ×8 IMPLANT
CONT SPEC 4OZ STRL OR WHT (MISCELLANEOUS) ×8
COVER BACK TABLE 80X110 HD (DRAPES) ×10 IMPLANT
COVER DOME SNAP 22 D (MISCELLANEOUS) IMPLANT
DECANTER SPIKE VIAL GLASS SM (MISCELLANEOUS) ×5 IMPLANT
DERMABOND ADHESIVE PROPEN (GAUZE/BANDAGES/DRESSINGS) ×1
DERMABOND ADVANCED (GAUZE/BANDAGES/DRESSINGS) ×2
DERMABOND ADVANCED .7 DNX12 (GAUZE/BANDAGES/DRESSINGS) ×4 IMPLANT
DERMABOND ADVANCED .7 DNX6 (GAUZE/BANDAGES/DRESSINGS) IMPLANT
DEVICE CLOSURE PERCLS PRGLD 6F (VASCULAR PRODUCTS) ×8 IMPLANT
DRAPE INCISE 23X17 IOBAN STRL (DRAPES) ×2
DRAPE INCISE 23X17 STRL (DRAPES) IMPLANT
DRAPE INCISE IOBAN 23X17 STRL (DRAPES) ×6 IMPLANT
DRAPE INCISE IOBAN 66X45 STRL (DRAPES) IMPLANT
DRAPE ORTHO SPLIT 77X108 STRL (DRAPES) ×4
DRAPE SURG ORHT 6 SPLT 77X108 (DRAPES) IMPLANT
DRSG TEGADERM 4X4.75 (GAUZE/BANDAGES/DRESSINGS) ×10 IMPLANT
ELECT CAUTERY BLADE 6.4 (BLADE) IMPLANT
ELECT REM PT RETURN 9FT ADLT (ELECTROSURGICAL) ×8
ELECTRODE REM PT RTRN 9FT ADLT (ELECTROSURGICAL) ×8 IMPLANT
FELT TEFLON 6X6 (MISCELLANEOUS) IMPLANT
FEMORAL VENOUS CANN RAP (CANNULA) IMPLANT
GAUZE SPONGE 4X4 12PLY STRL (GAUZE/BANDAGES/DRESSINGS) ×5 IMPLANT
GLOVE BIO SURGEON STRL SZ7.5 (GLOVE) ×5 IMPLANT
GLOVE BIO SURGEON STRL SZ8 (GLOVE) IMPLANT
GLOVE EUDERMIC 7 POWDERFREE (GLOVE) IMPLANT
GLOVE ORTHO TXT STRL SZ7.5 (GLOVE) IMPLANT
GOWN STRL REUS W/ TWL LRG LVL3 (GOWN DISPOSABLE) IMPLANT
GOWN STRL REUS W/ TWL XL LVL3 (GOWN DISPOSABLE) ×4 IMPLANT
GOWN STRL REUS W/TWL LRG LVL3 (GOWN DISPOSABLE)
GOWN STRL REUS W/TWL XL LVL3 (GOWN DISPOSABLE) ×4
GUIDEWIRE SAFE TJ AMPLATZ EXST (WIRE) ×5 IMPLANT
INSERT FOGARTY SM (MISCELLANEOUS) IMPLANT
KIT BASIN OR (CUSTOM PROCEDURE TRAY) ×5 IMPLANT
KIT DILATOR VASC 18G NDL (KITS) IMPLANT
KIT HEART LEFT (KITS) ×5 IMPLANT
KIT SAPIAN 3 ULTRA RESILIA 26 (Valve) ×1 IMPLANT
KIT SUCTION CATH 14FR (SUCTIONS) IMPLANT
KIT TURNOVER KIT B (KITS) ×5 IMPLANT
LOOP VESSEL MAXI BLUE (MISCELLANEOUS) IMPLANT
LOOP VESSEL MINI RED (MISCELLANEOUS) IMPLANT
NDL PERC 18GX7CM (NEEDLE) ×4 IMPLANT
NEEDLE 22X1 1/2 (OR ONLY) (NEEDLE) IMPLANT
NEEDLE PERC 18GX7CM (NEEDLE) ×4 IMPLANT
NS IRRIG 1000ML POUR BTL (IV SOLUTION) ×15 IMPLANT
PACK ENDO MINOR (CUSTOM PROCEDURE TRAY) ×5 IMPLANT
PACK ENDOVASCULAR (PACKS) ×5 IMPLANT
PAD ARMBOARD 7.5X6 YLW CONV (MISCELLANEOUS) ×10 IMPLANT
PAD ELECT DEFIB RADIOL ZOLL (MISCELLANEOUS) ×5 IMPLANT
PENCIL BUTTON HOLSTER BLD 10FT (ELECTRODE) ×5 IMPLANT
PERCLOSE PROGLIDE 6F (VASCULAR PRODUCTS) ×8
POSITIONER HEAD DONUT 9IN (MISCELLANEOUS) ×5 IMPLANT
SET MICROPUNCTURE 5F STIFF (MISCELLANEOUS) ×5 IMPLANT
SHEATH BRITE TIP 7FR 35CM (SHEATH) ×5 IMPLANT
SHEATH PINNACLE 6F 10CM (SHEATH) ×5 IMPLANT
SHEATH PINNACLE 8F 10CM (SHEATH) ×5 IMPLANT
SLEEVE REPOSITIONING LENGTH 30 (MISCELLANEOUS) ×5 IMPLANT
SPONGE T-LAP 4X18 ~~LOC~~+RFID (SPONGE) IMPLANT
STOPCOCK MORSE 400PSI 3WAY (MISCELLANEOUS) ×10 IMPLANT
SUT ETHIBOND X763 2 0 SH 1 (SUTURE) IMPLANT
SUT GORETEX CV 4 TH 22 36 (SUTURE) IMPLANT
SUT GORETEX CV4 TH-18 (SUTURE) IMPLANT
SUT MNCRL AB 3-0 PS2 18 (SUTURE) IMPLANT
SUT PROLENE 5 0 C 1 36 (SUTURE) IMPLANT
SUT PROLENE 6 0 C 1 30 (SUTURE) IMPLANT
SUT SILK  1 MH (SUTURE) ×4
SUT SILK 1 MH (SUTURE) ×4 IMPLANT
SUT VIC AB 2-0 CT1 27 (SUTURE)
SUT VIC AB 2-0 CT1 TAPERPNT 27 (SUTURE) IMPLANT
SUT VIC AB 2-0 CTX 36 (SUTURE) IMPLANT
SUT VIC AB 3-0 SH 8-18 (SUTURE) IMPLANT
SYR 50ML LL SCALE MARK (SYRINGE) ×5 IMPLANT
SYR BULB IRRIG 60ML STRL (SYRINGE) IMPLANT
SYR CONTROL 10ML LL (SYRINGE) IMPLANT
TOWEL GREEN STERILE (TOWEL DISPOSABLE) ×10 IMPLANT
TOWEL GREEN STERILE FF (TOWEL DISPOSABLE) ×5 IMPLANT
TRANSDUCER W/STOPCOCK (MISCELLANEOUS) ×10 IMPLANT
WIRE EMERALD 3MM-J .035X150CM (WIRE) ×5 IMPLANT
WIRE EMERALD 3MM-J .035X260CM (WIRE) ×5 IMPLANT

## 2022-01-28 NOTE — Progress Notes (Signed)
Patient arrived from cath lab to 4E17 post TAVR w/Dr. Clifton James.  Telemetry monitor applied and CCMD notified.  Skin assessment and CHG bath completed.  Right and left groin sites level 0.  Patient oriented to unit and room to include call light and phone.   ?

## 2022-01-28 NOTE — Progress Notes (Signed)
  [facility]   MULTIDISCIPLINARY HEART VALVE TEAM  Patient doing well s/p TAVR. He is hemodynamically stable. Groin sites stable. ECG with sinus and no high grade block. Arterial line discontinued and transferred to 4E. Plan for early ambulation after bedrest completed and hopeful discharge over the next 24-48 hours.   Murrell Elizondo PA-C  [facility]  Pager [phone]  

## 2022-01-28 NOTE — Anesthesia Procedure Notes (Signed)
Arterial Line Insertion ?Start/End4/25/2023 7:10 AM, 01/28/2022 7:25 AM ?Performed by: Barrington Ellison, CRNA, CRNA ? Patient location: Pre-op. ?Preanesthetic checklist: patient identified, risks and benefits discussed and pre-op evaluation ?Lidocaine 1% used for infiltration and patient sedated ?Right, radial was placed ?Catheter size: 20 G ?Hand hygiene performed  and maximum sterile barriers used  ? ?Attempts: 2 ?Procedure performed using ultrasound guided technique. ?Ultrasound Notes:anatomy identified, needle tip was noted to be adjacent to the nerve/plexus identified and no ultrasound evidence of intravascular and/or intraneural injection ?Following insertion, dressing applied and Biopatch. ?Post procedure assessment: normal ? ?Patient tolerated the procedure well with no immediate complications. ? ? ?

## 2022-01-28 NOTE — Interval H&P Note (Signed)
History and Physical Interval Note: ? ?01/28/2022 ?6:36 AM ? ?Marcus Powers  has presented today for surgery, with the diagnosis of Severe Aortic Stenosis.  The various methods of treatment have been discussed with the patient and family. After consideration of risks, benefits and other options for treatment, the patient has consented to  Procedure(s): ?Transcatheter Aortic Valve Replacement, Transfemoral (N/A) ?Possible Transcatheter Aortic Valve Replacement-Left Subclavian (Left) ?TRANSESOPHAGEAL ECHOCARDIOGRAM (TEE) (N/A) as a surgical intervention.  The patient's history has been reviewed, patient examined, no change in status, stable for surgery.  I have reviewed the patient's chart and labs.  Questions were answered to the patient's satisfaction.   ? ? ?Alleen Borne ? ? ?

## 2022-01-28 NOTE — Transfer of Care (Signed)
Immediate Anesthesia Transfer of Care Note ? ?Patient: Marcus Powers ? ?Procedure(s) Performed: Transcatheter Aortic Valve Replacement, Transfemoral (Chest) ?Possible Transcatheter Aortic Valve Replacement-Left Subclavian (Left: Chest) ?TRANSESOPHAGEAL ECHOCARDIOGRAM (TEE) (Chest) ?ULTRASOUND GUIDANCE FOR VASCULAR ACCESS (Bilateral: Groin) ? ?Patient Location: Cath Lab ? ?Anesthesia Type:General ? ?Level of Consciousness: drowsy ? ?Airway & Oxygen Therapy: Patient Spontanous Breathing ? ?Post-op Assessment: Report given to RN, Post -op Vital signs reviewed and stable and Patient moving all extremities X 4 ? ?Post vital signs: Reviewed and stable ? ?Last Vitals:  ?Vitals Value Taken Time  ?BP 123/44   ?Temp    ?Pulse 91 01/28/22 1024  ?Resp 17 01/28/22 1024  ?SpO2 94 % 01/28/22 1024  ?Vitals shown include unvalidated device data. ? ?Last Pain:  ?Vitals:  ? 01/28/22 1023  ?TempSrc:   ?PainSc: 0-No pain  ?   ? ?  ? ?Complications: No notable events documented. ?

## 2022-01-28 NOTE — Progress Notes (Signed)
?  Echocardiogram ?Echocardiogram Transesophageal has been performed. ? Marcus Powers ?01/28/2022, 9:42 AM ?

## 2022-01-28 NOTE — Progress Notes (Signed)
Mobility Specialist Progress Note ? ? 01/28/22 1726  ?Mobility  ?Activity Ambulated with assistance in room;Ambulated with assistance in hallway  ?Level of Assistance Minimal assist, patient does 75% or more  ?Assistive Device Front wheel walker  ?Distance Ambulated (ft) 32 ft  ?Activity Response Tolerated well  ?$Mobility charge 1 Mobility  ? ?Pre Mobility: 85 HR, 124/68 BP,97%  SpO2 ?During Mobility: 103 HR, 93% SpO2 ?Post Mobility: 92 HR, 149/85 BP, 96% SpO2 ? ?Received in bed having no complaints and agreeable. Groin sites remained stable throughout session but pt presenting w/ gen weakness and impaired mobility. Pt requiring cues to stand in upright posture to avoid already occurring trunk flexion and posterior leaning. Pt requiring no physical assist for bed mobility but otherwise MinA throughout. Left in bed w/ call bell in reach and needs met. ? ?  ?Mobility Specialist ?Phone Number [phone] ? ?

## 2022-01-28 NOTE — Op Note (Signed)
?[facility]   ?MULTIDISCIPLINARY HEART VALVE TEAM ? ? ?TAVR OPERATIVE NOTE ? ? ?Date of Procedure:  01/28/2022 ? ?Preoperative Diagnosis: Severe Aortic Stenosis  ? ?Postoperative Diagnosis: Same  ? ?Procedure:  ? ?Transcatheter Aortic Valve Replacement - Percutaneous Left Transfemoral Approach ? Edwards Sapien 3 Ultra Resilia THV (size 26 mm, model # 9755RSL, serial # [id number]) ?  ?Co-Surgeons:  Gaye Pollack, MD and Lauree Chandler, MD ? ? ?Anesthesiologist:  Therisa Doyne, MD ? ?Echocardiographer:  Osborne Oman, MD. ? ?Pre-operative Echo Findings: ?Severe aortic stenosis ?Normal left ventricular systolic function ? ?Post-operative Echo Findings: ?Trivial. paravalvular leak ?Normal left ventricular systolic function ? ? ?BRIEF CLINICAL NOTE AND INDICATIONS FOR SURGERY ? ?This 88 year old gentleman has stage D, severe, symptomatic aortic stenosis admitted with New York Heart Association class IV symptoms with severe shortness of breath at rest and a BNP of 840 with peripheral edema and probably some mild asymmetric pulmonary edema on chest x-ray.  His symptoms have resolved with diuresis.  I have personally reviewed his 2D echocardiogram, cardiac catheterization, and CTA studies.  His echocardiogram shows a severely calcified aortic valve with restricted leaflet mobility.  The mean gradient was 35 mmHg with a valve area of 0.39 cm? by VTI and a dimensionless index of 0.22 consistent with severe aortic stenosis.  There is mild to moderate aortic insufficiency.  His left ventricular ejection fraction is 50 to 55% with grade 2 diastolic dysfunction.  Cardiac catheterization showed mild nonobstructive coronary disease.  I agree that aortic valve replacement is indicated in this patient for relief of his progressive symptoms and to prevent further admissions for congestive heart failure and development of left ventricular failure.  Given his age of 36 years I think that transcatheter aortic valve  replacement would be the best option for treating him.  His gated cardiac CTA shows anatomy suitable for TAVR using a SAPIEN 3 valve.  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  The left subclavian artery appears to be of adequate caliber for insertion if needed for alternative access. ?  ?The patient was counseled at length regarding treatment alternatives for management of severe symptomatic aortic stenosis. The risks and benefits of surgical intervention has been discussed in detail. Long-term prognosis with medical therapy was discussed. Alternative approaches such as conventional surgical aortic valve replacement, transcatheter aortic valve replacement, and palliative medical therapy were compared and contrasted at length. This discussion was placed in the context of the patient's own specific clinical presentation and past medical history. All of his questions have been addressed.  ?  ?Following the decision to proceed with transcatheter aortic valve replacement, a discussion was held regarding what types of management strategies would be attempted intraoperatively in the event of life-threatening complications, including whether or not the patient would be considered a candidate for the use of cardiopulmonary bypass and/or conversion to open sternotomy for attempted surgical intervention. He is not a candidate for emergent sternotomy to manage intraoperative complications at 88 years old. The patient is aware of the fact that transient use of cardiopulmonary bypass may be necessary. The patient has been advised of a variety of complications that might develop including but not limited to risks of death, stroke, paravalvular leak, aortic dissection or other major vascular complications, aortic annulus rupture,  device embolization, cardiac rupture or perforation, mitral regurgitation, acute myocardial infarction, arrhythmia, heart block or bradycardia requiring permanent pacemaker placement, congestive heart failure, respiratory failure, renal failure, pneumonia, infection, other late complications related to structural valve deterioration or migration, or other complications that might ultimately cause a temporary or permanent loss of functional independence or other long term morbidity. The patient provides full informed consent for the procedure as described and all questions were answered.  ? ? ? ? ?DETAILS OF THE OPERATIVE PROCEDURE ? ?PREPARATION:   ? ?The patient was brought to the operating room on the above mentioned date and appropriate monitoring was established by the anesthesia team. The patient was placed in the supine position on the operating table.  Intravenous antibiotics were administered.  General endotracheal anesthesia was induced uneventfully.   ? ?Baseline transesophageal echocardiogram was performed. The patient's chest,abdomen and both groins were prepped and draped in a sterile manner. A time out procedure was performed. ? ? ?PERIPHERAL ACCESS:   ? ?Using the modified Seldinger technique, femoral arterial and venous access was obtained with placement of 6 Fr sheaths on the right side.  A pigtail diagnostic catheter was passed through the right arterial sheath under fluoroscopic guidance into the aortic root.  A temporary transvenous pacemaker catheter was passed through the right femoral venous sheath under fluoroscopic guidance into the right ventricle.  The pacemaker was tested to ensure stable lead placement and pacemaker capture. Aortic root angiography was performed in order to determine the optimal angiographic angle for valve deployment. ? ? ?TRANSFEMORAL ACCESS:  ? ?Percutaneous transfemoral access and sheath placement was performed using ultrasound guidance.  The left common femoral  artery was cannulated using a micropuncture needle and appropriate location was verified using hand injection angiogram.  A pair of Abbott Perclose percutaneous closure devices were placed and a 6 French sheath replaced into the femoral artery.  The patient was heparinized systemically and ACT verified > 250 seconds.   ? ?A 14 Fr transfemoral E-sheath was introduced into the left common femoral artery after progressively dilating over an Amplatz superstiff wire. An AL-2 catheter was used to direct a straight-tip exchange length wire across the native aortic valve into the left ventricle. This was exchanged out for a pigtail catheter and position was confirmed in the LV apex. Simultaneous LV and Ao pressures were recorded.  The pigtail catheter was exchanged for a Safari wire in the LV apex.   ? ? ?BALLOON AORTIC VALVULOPLASTY:  ? ?Not performed ? ? ?TRANSCATHETER HEART VALVE DEPLOYMENT:  ? ?An Edwards Sapien 3 Ultra transcatheter heart valve (size 26 mm) was prepared and crimped per manufacturer's guidelines, and the proper orientation of the valve is confirmed on the [employer] delivery system. The valve was advanced through the introducer sheath using normal technique until in an appropriate position in the abdominal aorta beyond the sheath tip. The balloon was then retracted and using the fine-tuning wheel was centered on the valve. The valve was then advanced across the aortic arch using appropriate flexion of the catheter. The valve was carefully positioned across the aortic valve annulus. The Commander catheter was retracted using normal technique. Once final position of the valve has been confirmed by angiographic assessment, the valve is deployed while temporarily holding ventilation and during rapid ventricular pacing to maintain systolic blood pressure < 50 mmHg and pulse pressure < 10 mmHg. The balloon inflation is held for >3 seconds after reaching full deployment volume. Once the balloon has  fully deflated the  balloon is retracted into the ascending aorta and valve function is assessed using echocardiography. There is felt to be trivial paravalvular leak and no central aortic insufficiency.  Terie Purser

## 2022-01-28 NOTE — Discharge Instructions (Signed)

## 2022-01-28 NOTE — Progress Notes (Signed)
Attempted right IJ placement. Unable to pass the wire. Procedure aborted. ?

## 2022-01-28 NOTE — Anesthesia Procedure Notes (Signed)
Procedure Name: Intubation ?Date/Time: 01/28/2022 7:54 AM ?Performed by: Barrington Ellison, CRNA ?Pre-anesthesia Checklist: Patient identified, Emergency Drugs available, Suction available and Patient being monitored ?Patient Re-evaluated:Patient Re-evaluated prior to induction ?Oxygen Delivery Method: Circle System Utilized ?Preoxygenation: Pre-oxygenation with 100% oxygen ?Induction Type: IV induction ?Ventilation: Mask ventilation without difficulty ?Laryngoscope Size: Sabra Heck and 3 ?Grade View: Grade I ?Tube type: Oral ?Tube size: 7.0 mm ?Number of attempts: 1 ?Airway Equipment and Method: Stylet ?Placement Confirmation: ETT inserted through vocal cords under direct vision, positive ETCO2 and breath sounds checked- equal and bilateral ?Secured at: 22 cm ?Tube secured with: Tape ?Dental Injury: Teeth and Oropharynx as per pre-operative assessment  ?Comments: Performed by Justin Mend, SRNA under direct supervision of MDA and CRNA ? ? ? ? ?

## 2022-01-28 NOTE — Anesthesia Postprocedure Evaluation (Signed)
Anesthesia Post Note ? ?Patient: [name] ? ?Procedure(s) Performed: Transcatheter Aortic Valve Replacement, Transfemoral (Chest) ?Possible Transcatheter Aortic Valve Replacement-Left Subclavian (Left: Chest) ?TRANSESOPHAGEAL ECHOCARDIOGRAM (TEE) (Chest) ?ULTRASOUND GUIDANCE FOR VASCULAR ACCESS (Bilateral: Groin) ? ?  ? ?Patient location during evaluation: Cath Lab ?Anesthesia Type: General ?Level of consciousness: awake and alert ?Pain management: pain level controlled ?Vital Signs Assessment: post-procedure vital signs reviewed and stable ?Respiratory status: spontaneous breathing, nonlabored ventilation and respiratory function stable ?Cardiovascular status: blood pressure returned to baseline and stable ?Postop Assessment: no apparent nausea or vomiting ?Anesthetic complications: no ? ? ?No notable events documented. ? ?Last Vitals:  ?Vitals:  ? 01/28/22 1125 01/28/22 1143  ?BP: 125/63 133/63  ?Pulse: 83 88  ?Resp: 17 18  ?Temp:  36.4 ?C  ?SpO2: 94% 95%  ?  ?Last Pain:  ?Vitals:  ? 01/28/22 1143  ?TempSrc: Oral  ?PainSc: 0-No pain  ? ? ?  ?  ?  ?  ?  ?  ? ?Tammera Engert,W. EDMOND ? ? ? ? ?

## 2022-01-28 NOTE — CV Procedure (Signed)
?[facility]  ?TAVR OPERATIVE NOTE ? ? ?Date of Procedure:  01/28/2022 ? ?Preoperative Diagnosis: Severe Aortic Stenosis  ? ?Postoperative Diagnosis: Same  ? ?Procedure:  ? ?Transcatheter Aortic Valve Replacement - Transfemoral Approach ? Edwards Sapien 3 THV (size 26 mm, model # [id number], serial # [id number]) ?  ?Co-Surgeons:  Lauree Chandler, MD and Gaye Pollack, MD  ? ?Anesthesiologist:  Ola Spurr ? ?Echocardiographer:  Gasper Sells ? ?Pre-operative Echo Findings: ?Severe aortic stenosis ?Normal left ventricular systolic function ? ?Post-operative Echo Findings: ?Trivial paravalvular leak ?Normal left ventricular systolic function ? ?BRIEF CLINICAL NOTE AND INDICATIONS FOR SURGERY ? ?Marcus Powers is a 88 yo male with history of HTN, HLD, mild CAD, chronic diastolic CHF and severe AS here today for TAVR. He is known to have severe aortic stenosis by echo in 2022. He has chosen not to pursue TAVR workup up to this point as he was feeling well. He has been followed by Dr. Virgina Jock in the outpatient setting. He has had several admissions over the past two months with CHF. Echo in October 2022 with severe AS with mean gradient 35 mmHg, AVA 0.6 cm2. LVEF=55%. Echo March 2023 with LVEF=50-55% with similar gradient across the aortic valve. Cardiac cath with mild CAD.  ? ?During the course of the patient's preoperative work up they have been evaluated comprehensively by a multidisciplinary team of specialists coordinated through the [facility] in the [facility].  They have been demonstrated to suffer from symptomatic severe aortic stenosis as noted above. The patient has been counseled extensively as to the relative risks and benefits of all options for the treatment of severe aortic stenosis including long term medical therapy, conventional surgery for aortic valve replacement, and transcatheter aortic valve replacement.  The patient has been  independently evaluated by Dr. Cyndia Bent with CT surgery and they are felt to be at high risk for conventional surgical aortic valve replacement. The surgeon indicated the patient would be a poor candidate for conventional surgery. Based upon review of all of the patient's preoperative diagnostic tests they are felt to be candidate for transcatheter aortic valve replacement using the transfemoral approach as an alternative to high risk conventional surgery.   ? ?Following the decision to proceed with transcatheter aortic valve replacement, a discussion has been held regarding what types of management strategies would be attempted intraoperatively in the event of life-threatening complications, including whether or not the patient would be considered a candidate for the use of cardiopulmonary bypass and/or conversion to open sternotomy for attempted surgical intervention.  The patient has been advised of a variety of complications that might develop peculiar to this approach including but not limited to risks of death, stroke, paravalvular leak, aortic dissection or other major vascular complications, aortic annulus rupture, device embolization, cardiac rupture or perforation, acute myocardial infarction, arrhythmia, heart block or bradycardia requiring permanent pacemaker placement, congestive heart failure, respiratory failure, renal failure, pneumonia, infection, other late complications related to structural valve deterioration or migration, or other complications that might ultimately cause a temporary or permanent loss of functional independence or other long term morbidity.  The patient provides full informed consent for the procedure as described and all questions were answered preoperatively. ? ? ? ?DETAILS OF THE OPERATIVE PROCEDURE ? ?PREPARATION:   ?The patient is brought to the operating room on the above mentioned date and central monitoring was established by the anesthesia team including placement of a  radial arterial line. The patient  is placed in the supine position on the operating table.  Intravenous antibiotics are administered. General anesthesia is used. The patient is intubated.   ? ?Baseline transesophageal echocardiogram was performed. The patient's chest, abdomen, both groins, and both lower extremities are prepared and draped in a sterile manner. A time out procedure is performed. ? ? ?PERIPHERAL ACCESS:   ?Using the modified Seldinger technique, femoral arterial and venous access were obtained with placement of a 6 Fr sheath in the artery and a 7 Fr sheath in the vein on the right side using u/s guidance.  A pigtail diagnostic catheter was passed through the femoral arterial sheath under fluoroscopic guidance into the aortic root.  A temporary transvenous pacemaker catheter was passed through the femoral venous sheath under fluoroscopic guidance into the right ventricle.  The pacemaker was tested to ensure stable lead placement and pacemaker capture. Aortic root angiography was performed in order to determine the optimal angiographic angle for valve deployment. ? ?TRANSFEMORAL ACCESS:  ?A micropuncture kit was used to gain access to the left femoral artery using u/s guidance. Position confirmed with angiography. Pre-closure with double ProGlide closure devices. The patient was heparinized systemically and ACT verified > 250 seconds.   ? ?A 14 Fr transfemoral E-sheath was introduced into the left femoral artery after progressively dilating over an Amplatz superstiff wire. An AL-2 catheter was used to direct a straight-tip exchange length wire across the native aortic valve into the left ventricle. This was exchanged out for a pigtail catheter and position was confirmed in the LV apex. Simultaneous LV and Ao pressures were recorded.  The pigtail catheter was then exchanged for an Amplatz Extra-stiff wire in the LV apex.  ? ?TRANSCATHETER HEART VALVE DEPLOYMENT:  ?An Edwards Sapien 3 THV (size 26 mm) was  prepared and crimped per manufacturer's guidelines, and the proper orientation of the valve is confirmed on the [employer] delivery system. The valve was advanced through the introducer sheath using normal technique until in an appropriate position in the abdominal aorta beyond the sheath tip. The balloon was then retracted and using the fine-tuning wheel was centered on the valve. The valve was then advanced across the aortic arch using appropriate flexion of the catheter. The valve was carefully positioned across the aortic valve annulus. The Commander catheter was retracted using normal technique. Once final position of the valve has been confirmed by angiographic assessment, the valve is deployed while temporarily holding ventilation and during rapid ventricular pacing to maintain systolic blood pressure < 50 mmHg and pulse pressure < 10 mmHg. The balloon inflation is held for >3 seconds after reaching full deployment volume. Once the balloon has fully deflated the balloon is retracted into the ascending aorta and valve function is assessed using TTE. There is felt to be trivial paravalvular leak and no central aortic insufficiency.  The patient's hemodynamic recovery following valve deployment is good.  The deployment balloon and guidewire are both removed. Echo demostrated acceptable post-procedural gradients, stable mitral valve function, and trivial AI.  ? ?PROCEDURE COMPLETION:  ?The sheath was then removed and closure devices were completed. Protamine was administered once femoral arterial repair was complete. The temporary pacemaker, pigtail catheters and femoral sheaths were removed with a Mynx closure device placed in the artery and manual pressure used for venous hemostasis.   ? ?The patient tolerated the procedure well and is transported to the surgical intensive care in stable condition. There were no immediate intraoperative complications. All sponge instrument and needle counts are  verified  correct at completion of the operation.  ? ?No blood products were administered during the operation. ? ?The patient received a total of 80.7 mL of intravenous contrast during the procedure. ? ?LVEDP=74mH

## 2022-01-29 ENCOUNTER — Encounter (HOSPITAL_COMMUNITY): Payer: Self-pay | Admitting: Cardiovascular Disease

## 2022-01-29 ENCOUNTER — Inpatient Hospital Stay (HOSPITAL_COMMUNITY): Payer: Medicare Other

## 2022-01-29 DIAGNOSIS — Z952 Presence of prosthetic heart valve: Secondary | ICD-10-CM | POA: Diagnosis not present

## 2022-01-29 DIAGNOSIS — I130 Hypertensive heart and chronic kidney disease with heart failure and stage 1 through stage 4 chronic kidney disease, or unspecified chronic kidney disease: Secondary | ICD-10-CM | POA: Diagnosis not present

## 2022-01-29 DIAGNOSIS — I350 Nonrheumatic aortic (valve) stenosis: Secondary | ICD-10-CM | POA: Diagnosis not present

## 2022-01-29 DIAGNOSIS — N179 Acute kidney failure, unspecified: Secondary | ICD-10-CM | POA: Diagnosis not present

## 2022-01-29 DIAGNOSIS — I352 Nonrheumatic aortic (valve) stenosis with insufficiency: Secondary | ICD-10-CM | POA: Diagnosis not present

## 2022-01-29 DIAGNOSIS — Z006 Encounter for examination for normal comparison and control in clinical research program: Secondary | ICD-10-CM | POA: Diagnosis not present

## 2022-01-29 LAB — ECHOCARDIOGRAM COMPLETE
AR max vel: 1.73 cm2
AV Area VTI: 1.67 cm2
AV Area mean vel: 1.68 cm2
AV Mean grad: 9 mmHg
AV Peak grad: 12.7 mmHg
Ao pk vel: 1.79 m/s
Area-P 1/2: 4.36 cm2
Calc EF: 51.4 %
Height: 66 in
MV M vel: 4.77 m/s
MV Peak grad: 90.8 mmHg
P 1/2 time: 325 msec
S' Lateral: 3.3 cm
Single Plane A2C EF: 53.9 %
Single Plane A4C EF: 51 %
Weight: 2321.6 oz

## 2022-01-29 LAB — MAGNESIUM: Magnesium: 2.2 mg/dL (ref 1.7–2.4)

## 2022-01-29 LAB — CBC
HCT: 33.1 % — ABNORMAL LOW (ref 39.0–52.0)
Hemoglobin: 10.9 g/dL — ABNORMAL LOW (ref 13.0–17.0)
MCH: 31.9 pg (ref 26.0–34.0)
MCHC: 32.9 g/dL (ref 30.0–36.0)
MCV: 96.8 fL (ref 80.0–100.0)
Platelets: 156 10*3/uL (ref 150–400)
RBC: 3.42 MIL/uL — ABNORMAL LOW (ref 4.22–5.81)
RDW: 13 % (ref 11.5–15.5)
WBC: 10.1 10*3/uL (ref 4.0–10.5)
nRBC: 0 % (ref 0.0–0.2)

## 2022-01-29 LAB — BASIC METABOLIC PANEL
Anion gap: 9 (ref 5–15)
BUN: 29 mg/dL — ABNORMAL HIGH (ref 8–23)
CO2: 27 mmol/L (ref 22–32)
Calcium: 8.6 mg/dL — ABNORMAL LOW (ref 8.9–10.3)
Chloride: 99 mmol/L (ref 98–111)
Creatinine, Ser: 1.48 mg/dL — ABNORMAL HIGH (ref 0.61–1.24)
GFR, Estimated: 44 mL/min — ABNORMAL LOW (ref >60)
Glucose, Bld: 124 mg/dL — ABNORMAL HIGH (ref 70–99)
Potassium: 3.4 mmol/L — ABNORMAL LOW (ref 3.5–5.1)
Sodium: 135 mmol/L (ref 135–145)

## 2022-01-29 MED ORDER — ASPIRIN 81 MG PO CHEW: 81.0000 mg | CHEWABLE_TABLET | Freq: Every day | ORAL | Status: DC

## 2022-01-29 MED ORDER — AMLODIPINE BESYLATE 5 MG PO TABS: 5.0000 mg | ORAL_TABLET | Freq: Every day | ORAL | 11 refills | Status: DC

## 2022-01-29 MED ORDER — POTASSIUM CHLORIDE CRYS ER 20 MEQ PO TBCR: 20.0000 meq | EXTENDED_RELEASE_TABLET | Freq: Every day | ORAL | 1 refills | Status: DC

## 2022-01-29 MED ORDER — POTASSIUM CHLORIDE CRYS ER 20 MEQ PO TBCR
40.0000 meq | EXTENDED_RELEASE_TABLET | Freq: Once | ORAL | Status: AC
  Administered 2022-01-29: 40 meq via ORAL
  Filled 2022-01-29: qty 2

## 2022-01-29 MED FILL — Potassium Chloride Inj 2 mEq/ML: INTRAVENOUS | Qty: 20 | Status: AC

## 2022-01-29 MED FILL — Magnesium Sulfate Inj 50%: INTRAMUSCULAR | Qty: 10 | Status: AC

## 2022-01-29 MED FILL — Heparin Sodium (Porcine) Inj 1000 Unit/ML: Qty: 1000 | Status: AC

## 2022-01-29 NOTE — Progress Notes (Signed)
Pt is alert and fully oriented x 4. He is hemodynamically stable, afebrile, no acute distress noted tonight. EKG  is NSR with frequent PACs and unifocal PVCs on the monitor. On Nitroglycerine at 53mcg/min. BP is controlled well, BP120s-140s/60s-70s. Pt has complaint of mild headache, probably  from side effect of NTG. Tylenol is given. He is able to rest well tonight.  ? ?Bilateral groins are negative for hematoma or bleeding. Pain is tolerated.  ? ?Left foot has numbness and tingling per Pt's baseline. Doppler gets weak signals on PT and DP on left foot. Equal warm to touch both feet. Capillary refilled is less than 3 seconds bilaterally. ?Right foot is normal sensation and patent signals on PT and DP. We will hand off to a morning round team for future evaluation.  ? ?We will continue to monitor. ? ?Kennyth Lose, RN ? ?

## 2022-01-29 NOTE — TOC Transition Note (Signed)
Transition of Care (TOC) - CM/SW Discharge Note ?Donn Pierini Charity fundraiser, BSN ?Transitions of Care ?Unit 4E- RN Case Manager ?See Treatment Team for direct phone #  ? ? ?Patient Details  ?Name: Marcus Powers ?MRN: [id number] ?Date of Birth: Jul 22, 1928 ? ?Transition of Care (TOC) CM/SW Contact:  ?Zenda Alpers, Lenn Sink, RN ?Phone Number: ?01/29/2022, 2:16 PM ? ? ?Clinical Narrative:    ?Pt from home s/p TAVR, stable for transition home today. Transition of Care Department [facility]) has reviewed patient and no TOC needs have been identified at this time.  ? ?Final next level of care: Home/Self Care ?Barriers to Discharge: No Barriers Identified ? ? ?Patient Goals and CMS Choice ?  ?  ?Choice offered to / list presented to : NA ? ?Discharge Placement ?  ?         Home  ?  ?  ?  ?  ? ?Discharge Plan and Services ?  ?Discharge Planning Services: NA ?Post Acute Care Choice: NA          ?DME Arranged: N/A ?DME Agency: NA ?  ?  ?  ?HH Arranged: NA ?HH Agency: NA ?  ?  ?  ? ?Social Determinants of Health (SDOH) Interventions ?  ? ? ?Readmission Risk Interventions ? ?  01/29/2022  ?  2:16 PM 01/23/2022  ? 10:35 AM  ?Readmission Risk Prevention Plan  ?Transportation Screening Complete Complete  ?PCP or Specialist Appt within 5-7 Days Complete Complete  ?Home Care Screening Complete Complete  ?Medication Review (RN CM) Complete Complete  ? ? ? ? ? ?

## 2022-01-29 NOTE — Progress Notes (Signed)
Mobility Specialist Progress Note ? ? 01/29/22 1214  ?Mobility  ?Activity Ambulated with assistance in hallway  ?Level of Assistance Minimal assist, patient does 75% or more  ?Assistive Device Front wheel walker  ?Distance Ambulated (ft) 116 ft  ?Activity Response Tolerated well  ?$Mobility charge 1 Mobility  ? ?Pre Mobility: 91 HR, 136/66 BP, 93% SpO2 ?During Mobility: 104 HR,  ?Post Mobility: 95 HR, 155/70 BP, 91% SpO2 ? ?Received pt in bed having no complaints and agreeable. Pt ind. for bed mobility but requiring minA for ambulation d/t moderate weakness in LE, also noted pt's gait was very narrow and requiring cues to broaden foot placement when stepping to increase stability. Returned back to bed w/o fault all while groin sites remained stable. Call bell was left by side w/ Cardiac Rehab present in room.    ? ?Frederico Hamman ?Mobility Specialist ?Phone Number [phone] ? ?

## 2022-01-29 NOTE — Discharge Summary (Addendum)
?[facility]   ?MULTIDISCIPLINARY HEART VALVE TEAM ? ?Discharge Summary  ?  ?Patient ID: Marcus Powers ?MRN: [id number]; DOB: 1928/01/13 ? ?Admit date: 01/28/2022 ?Discharge date: 01/29/2022 ? ?Primary Care Provider: Lavone Orn, MD  ?Primary Cardiologist: Nigel Mormon, MD  ? ?Discharge Diagnoses  ?  ?Principal Problem: ?  S/P TAVR (transcatheter aortic valve replacement) ?Active Problems: ?  Severe aortic stenosis ?  Essential hypertension ?  Acute on chronic diastolic heart failure ([place]) ?  Hyperlipidemia ?  Normocytic anemia ? ? ?Allergies ?Allergies  ?Allergen Reactions  ? Benazepril Hcl Cough  ? ? ?Diagnostic Studies/Procedures  ?  ?TAVR OPERATIVE NOTE ?  ?  ?Date of Procedure:                01/28/2022 ?  ?Preoperative Diagnosis:      Severe Aortic Stenosis  ?  ?Postoperative Diagnosis:    Same  ?  ?Procedure:      ?  ?Transcatheter Aortic Valve Replacement - Percutaneous Left Transfemoral Approach ?            Edwards Sapien 3 Ultra Resilia THV (size 26 mm, model # [id number], serial # [id number]) ?             ?Co-Surgeons:                        Gaye Pollack, MD and Lauree Chandler, MD ?  ?  ?Anesthesiologist:                  Therisa Doyne, MD ?  ?Echocardiographer:              Osborne Oman, MD. ?  ?Pre-operative Echo Findings: ?Severe aortic stenosis ?Normal left ventricular systolic function ?  ?Post-operative Echo Findings: ?Trivial. paravalvular leak ?Normal left ventricular systolic function ?  ?_____________ ?  ? ?Echo 01/29/22: completed but pending formal read at the time of discharge  ? ?History of Present Illness   ?  ?Marcus Powers is a 88 y.o. male with a history of HTN, HLD, HFpEF with recurrent admissions and ER visits and severe aortic stenosis with moderate AI who presented to [facility] on 01/28/22 for planned TAVR.  ? ?He was admitted to the hospital in May 2022 with shortness of breath and found to have a BNP greater than 800.  An echocardiogram at that time show severe  aortic stenosis with a mean gradient of 47.2 mmHg and a valve area of 0.9 cm? by VTI. Ejection fraction is 50 to 55% with mild mitral regurgitation.  He was treated with intravenous Lasix with symptom resolution.  He was seen by Dr. Virgina Marcus in July 2022 and was doing well and still mowing his yard and doing things around the house without shortness of breath or chest discomfort.  There was apparently some discussion about referring him for TAVR work-up but he was reluctant. Plans were made for repeat echocardiogram in a few months.  He had an echocardiogram in October 2022 showing a trileaflet aortic valve with a mean gradient of 38 mmHg and a valve area of 0.6 cm? with a dimensionless index of 0.17 consistent with severe aortic stenosis.  There was moderate aortic insufficiency and moderate mitral regurgitation.  Left ventricular ejection fraction was 55% with moderate concentric LVH.  He continued to feel well and did not want to consider TAVR at that time because he was asymptomatic.   ? ?He has had several readmissions for acute  CHF. Most recent echo 12/13/21 showed EF 50-55%, G2DD, mean gradient at 35.0 mmHg, peak gradient 56.0 mmHg, and AVA 0.31 cm2, DVI: 0.22, SVI: 18, mild to mod AI. L/RHC with mild non obst CAD. CT did show abdominal aortic dissection. He was most recently admitted 4/18-4/20/23. He was treated with IV lasix and Lasix switched to torsemide. He was seen by Dr. Cyndia Bent during this admission and plans were made to bring him back for TAVR 01/28/22. ? ?Hospital Course  ?   ?Consultants: none ? ?Severe AS: s/p successful TAVR with a 26 mm Edwards Sapien 3 Ultra Resilia THV via the TF approach on 01/28/22. Post operative echo completed but pending formal read. Groin sites are stable. ECG with sinus/LVH w/repol abnormalities but and no high grade heart block. Started on aspirin 81 mg daily. Plan for discharge home today with close follow up in the office next week.   ?  ?Acute on chronic diastolic  CHF: he appears euvolemic after most recent admission last week. LVEDP was normal during procedure yesterday. Creat back down to baseline ~ 1.48. Will resume home torsemide 20mg  BID and add Kdur 75meq daily. ? ?AKI on CKD stage IIIb: baseline creat 1.2-1.6. PAT labs showed creat up to 1.9. He did not get torsemide yesterday. Creat back to baseline this AM. Resume home torsemide and recheck labs next week.  ? ?Hypokalemia: repleted. 3.4 today. Will discharge on [place] 10meq daily. BMET next week.  ? ?HTN: BP has been elevated and he was treated with a nitro gtt overnight. I have discontinued this and will start him on Norvasc 5mg  daily. Will continue to follow.  ?_____________ ? ?Discharge Vitals ?Blood pressure 137/66, pulse 94, temperature 98 ?F (36.7 ?C), temperature source Oral, resp. rate 20, height 5\' 6"  (1.676 m), weight 65.8 kg, SpO2 93 %.  ?Filed Weights  ? 01/28/22 0631 01/29/22 0344  ?Weight: 68 kg 65.8 kg  ? ? ? ?GEN: Well nourished, well developed, in no acute distress ?HEENT: normal ?Neck: no JVD or masses ?Cardiac: RRR; no murmurs, rubs, or gallops,no edema  ?Respiratory:  clear to auscultation bilaterally, normal work of breathing ?GI: soft, nontender, nondistended, + BS ?MS: no deformity or atrophy ?Skin: warm and dry, no rash.  Groin sites clear without hematoma or ecchymosis  ?Neuro:  Alert and Oriented x 3, Strength and sensation are intact ?Psych: euthymic mood, full affect ? ? ?Labs & Radiologic Studies  ?  ?CBC ?Recent Labs  ?  01/28/22 ?1031 01/29/22 ?0150  ?WBC  --  10.1  ?HGB 12.2* 10.9*  ?HCT 36.0* 33.1*  ?MCV  --  96.8  ?PLT  --  156  ? ?Basic Metabolic Panel ?Recent Labs  ?  01/28/22 ?1031 01/29/22 ?0150  ?NA 139 135  ?K 3.4* 3.4*  ?CL 99 99  ?CO2  --  27  ?GLUCOSE 137* 124*  ?BUN 37* 29*  ?CREATININE 1.80* 1.48*  ?CALCIUM  --  8.6*  ?MG  --  2.2  ? ?Liver Function Tests ?No results for input(s): AST, ALT, ALKPHOS, BILITOT, PROT, ALBUMIN in the last 72 hours. ?No results for input(s):  LIPASE, AMYLASE in the last 72 hours. ?Cardiac Enzymes ?No results for input(s): CKTOTAL, CKMB, CKMBINDEX, TROPONINI in the last 72 hours. ?BNP ?Invalid input(s): POCBNP ?D-Dimer ?No results for input(s): DDIMER in the last 72 hours. ?Hemoglobin A1C ?No results for input(s): HGBA1C in the last 72 hours. ?Fasting Lipid Panel ?No results for input(s): CHOL, HDL, LDLCALC, TRIG, CHOLHDL, LDLDIRECT in the last  72 hours. ?Thyroid Function Tests ?No results for input(s): TSH, T4TOTAL, T3FREE, THYROIDAB in the last 72 hours. ? ?Invalid input(s): FREET3 ?_____________  ?DG Chest 2 View ? ?Result Date: 01/27/2022 ?CLINICAL DATA:  Preop exam.  Severe aortic stenosis. EXAM: CHEST - 2 VIEW COMPARISON:  January 21, 2022 FINDINGS: The heart size and mediastinal contours are within normal limits. Minimal atelectasis of left lung base is noted. Both lungs are otherwise clear. The visualized skeletal structures are stable. IMPRESSION: No active cardiopulmonary disease. Electronically Signed   By: Abelardo Diesel M.D.   On: 01/27/2022 13:31  ? ?DG Chest Port 1 View ? ?Result Date: 01/21/2022 ?CLINICAL DATA:  Shortness of breath. EXAM: PORTABLE CHEST 1 VIEW COMPARISON:  01/19/2022 FINDINGS: 0700 hours. The cardio pericardial silhouette is enlarged. Similar appearance bibasilar atelectasis or infiltrate. Degenerative changes noted both shoulders. Telemetry leads overlie the chest. IMPRESSION: Similar appearance of bibasilar atelectasis or infiltrate. Electronically Signed   By: Misty Stanley M.D.   On: 01/21/2022 07:24  ? ?DG Chest Port 1 View ? ?Result Date: 01/19/2022 ?CLINICAL DATA:  Shortness of breath EXAM: PORTABLE CHEST 1 VIEW COMPARISON:  Radiograph 12/12/2021 FINDINGS: Unchanged cardiomediastinal silhouette. There are mild interstitial opacities, right greater than left and bibasilar opacities. No large pleural effusion. No visible pneumothorax. Right glenohumeral osteoarthritis. No acute osseous abnormality. IMPRESSION: Asymmetric  right mild interstitial opacities and patchy bibasilar opacities could reflect mild asymmetric pulmonary edema. Electronically Signed   By: Maurine Simmering M.D.   On: 01/19/2022 15:12  ? ?ECHO TEE ? ?Result Date: 4/

## 2022-01-29 NOTE — Progress Notes (Signed)
CARDIAC REHAB PHASE I  ? ?Pt recently ambulated with mobility tech, states breathing is much better. Reviewed site care and restrictions with pt and family. Encouraged continued ambulation with emphasis on safety. Pt declines CRP II. ? ?[id number] ?Reynold Bowen, RN BSN ?01/29/2022 ?12:15 PM ? ?

## 2022-01-30 ENCOUNTER — Telehealth: Payer: Self-pay | Admitting: Physician Assistant

## 2022-01-30 NOTE — Telephone Encounter (Signed)
?  [facility]   ?MULTIDISCIPLINARY HEART VALVE TEAM ? ? ?Patient contacted regarding discharge from [facility] on 4/26 ? ?Patient understands to follow up with a structural heart APP on 5/3 at [facility].  ?Patient understands discharge instructions? yes ?Patient understands medications and regimen? yes ?Patient understands to bring all medications to this visit? Yes ? ? ? ?Cline Crock PA-C  [facility]  ? ? ? ?

## 2022-02-03 ENCOUNTER — Encounter: Payer: Self-pay | Admitting: Cardiology

## 2022-02-03 NOTE — Progress Notes (Signed)
?[facility]   ?[place] ?                                    ?Cardiology Office Note:   ? ?Date:  02/05/2022  ? ?ID:  Marcus Powers, DOB [birth year], MRN [id number] ? ?PCP:  Lavone Orn, MD  ?[facility] Cardiologist:  Nigel Mormon, MD / Dr Angelena Form & Dr. Cyndia Bent (TAVR) ?[place] Electrophysiologist:  None  ? ?Referring MD: Lavone Orn, MD  ? ?[facility] s/p TAVR ? ?History of Present Illness:   ? ?Marcus Powers is a 88 y.o. male with a hx of HTN, HLD, HFpEF with recurrent admissions and ER visits and severe aortic stenosis with moderate AI s/p TAVR (01/28/22) who presents to clinic for follow up.  ? ?He was admitted to the hospital in May 2022 with shortness of breath and found to have a BNP greater than 800.  An echocardiogram at that time show severe aortic stenosis with a mean gradient of 47.2 mmHg and a valve area of 0.9 cm? by VTI. Ejection fraction is 50 to 55% with mild mitral regurgitation.  He was treated with intravenous Lasix with symptom resolution.  He was seen by Dr. Virgina Jock in July 2022 and was doing well and still mowing his yard and doing things around the house without shortness of breath or chest discomfort.  There was apparently some discussion about referring him for TAVR work-up but he was reluctant. Plans were made for repeat echocardiogram in a few months.  He had an echocardiogram in October 2022 showing a trileaflet aortic valve with a mean gradient of 38 mmHg and a valve area of 0.6 cm? with a dimensionless index of 0.17 consistent with severe aortic stenosis.  There was moderate aortic insufficiency and moderate mitral regurgitation.  Left ventricular ejection fraction was 55% with moderate concentric LVH.  He continued to feel well and did not want to consider TAVR at that time because he was asymptomatic.   ?  ?He had several readmissions for acute CHF. Most recent echo 12/13/21 showed EF 50-55%, G2DD, mean gradient at 35.0 mmHg, peak  gradient 56.0 mmHg, and AVA 0.31 cm2, DVI: 0.22, SVI: 18, mild to mod AI. L/RHC with mild non obst CAD. CT did show abdominal aortic dissection. He was most recently admitted 4/18-4/20/23. He was treated with IV lasix and Lasix switched to torsemide. He was seen by Dr. Cyndia Bent during this admission and plans were made to bring him back for TAVR 01/28/22. ? ?He underwent successful TAVR with a 26 mm Edwards Sapien 3 Ultra Resilia THV via the TF approach on 01/28/22. Post operative echo showed EF 55%, moderate LVH, normally functioning TAVR with a mean gradient of 9 mmHg and no PVL. He was discharged on POD1 on aspirin alone. Due to hypokalemia, Kdur was added. Additionally, he was hypertensive requiring a nitro gtt and was started on Norvasc 5mg  daily at discharge.  ? ?Today the patient presents to clinic for follow up. Here with son in law. No CP or SOB. No LE edema, orthopnea or PND. No dizziness or syncope. No blood in stool or urine. No palpitations. He is feeling so much better since TAVR he states he just feels "wonderful." Left leg pain has also improved.  ? ? ? ? ?Past Medical History:  ?Diagnosis Date  ? Chronic diastolic heart failure ([place])   ? Hyperlipidemia   ?  Hypertension   ? S/P TAVR (transcatheter aortic valve replacement) 01/28/2022  ? s/p TAVR with a 26 mm Edwards S3UR via the TF approach by Dr. Clifton James & Dr. Laneta Simmers  ? Severe aortic stenosis   ? Spinal stenosis   ? ? ?Past Surgical History:  ?Procedure Laterality Date  ? BIOPSY BREAST    ? EYE SURGERY Bilateral   ? RIGHT HEART CATH AND CORONARY ANGIOGRAPHY N/A 12/13/2021  ? Procedure: RIGHT HEART CATH AND CORONARY ANGIOGRAPHY;  Surgeon: Elder Negus, MD;  Location: [facility]AS[facility];  Service: Cardiovascular;  Laterality: N/A;  ? TEE WITHOUT CARDIOVERSION N/A 01/28/2022  ? Procedure: TRANSESOPHAGEAL ECHOCARDIOGRAM (TEE);  Surgeon: Kathleene Hazel, MD;  Location: [facility] OR;  Service: Open Heart Surgery;  Laterality: N/A;  ? TONSILLECTOMY     ? TRANSCATHETER AORTIC VALVE REPLACEMENT, TRANSFEMORAL N/A 01/28/2022  ? Procedure: Transcatheter Aortic Valve Replacement, Transfemoral;  Surgeon: Kathleene Hazel, MD;  Location: [facility] OR;  Service: Open Heart Surgery;  Laterality: N/A;  ? ULTRASOUND GUIDANCE FOR VASCULAR ACCESS Bilateral 01/28/2022  ? Procedure: ULTRASOUND GUIDANCE FOR VASCULAR ACCESS;  Surgeon: Kathleene Hazel, MD;  Location: [facility] OR;  Service: Open Heart Surgery;  Laterality: Bilateral;  ? ? ?Current Medications: ?Current Meds  ?Medication Sig  ? acetaminophen (TYLENOL) 650 MG CR tablet Take 650 mg by mouth daily.  ? amLODipine (NORVASC) 5 MG tablet Take 1 tablet (5 mg total) by mouth daily.  ? amoxicillin (AMOXIL) 500 MG tablet Take 4 tablets (2,000 mg total) by mouth as directed. 1 hour prior to dental work including cleanings  ? aspirin 81 MG chewable tablet Chew 1 tablet (81 mg total) by mouth daily.  ? pravastatin (PRAVACHOL) 20 MG tablet Take 20 mg by mouth daily.  ? torsemide (DEMADEX) 20 MG tablet Take 20 mg by mouth daily.  ? [DISCONTINUED] potassium chloride SA (KLOR-CON M) 20 MEQ tablet Take 1 tablet (20 mEq total) by mouth daily.  ? [DISCONTINUED] torsemide (DEMADEX) 20 MG tablet Take 1 tablet (20 mg total) by mouth 2 (two) times daily.  ?  ? ?Allergies:   Benazepril hcl  ? ?Social History  ? ?Socioeconomic History  ? Marital status: Married  ?  Spouse name: Not on file  ? Number of children: Not on file  ? Years of education: Not on file  ? Highest education level: Not on file  ?Occupational History  ? Not on file  ?Tobacco Use  ? Smoking status: Former  ?  Packs/day: 0.25  ?  Years: 5.00  ?  Pack years: 1.25  ?  Types: Cigarettes  ?  Quit date: 1969  ?  Years since quitting: 54.3  ? Smokeless tobacco: Never  ?Vaping Use  ? Vaping Use: Never used  ?Substance and Sexual Activity  ? Alcohol use: No  ? Drug use: No  ? Sexual activity: Not on file  ?Other Topics Concern  ? Not on file  ?Social History Narrative  ? Not on file   ? ?Social Determinants of Health  ? ?Financial Resource Strain: Not on file  ?Food Insecurity: Not on file  ?Transportation Needs: Not on file  ?Physical Activity: Not on file  ?Stress: Not on file  ?Social Connections: Not on file  ?  ? ?Family History: ?The patient's family history includes Cancer in his mother; Stroke in his father. ? ?ROS:   ?Please see the history of present illness.    ?All other systems reviewed and are negative. ? ?EKGs/Labs/Other Studies  Reviewed:   ? ?The following studies were reviewed today: ? ?TAVR OPERATIVE NOTE ?  ?  ?Date of Procedure:                01/28/2022 ?  ?Preoperative Diagnosis:      Severe Aortic Stenosis  ?  ?Postoperative Diagnosis:    Same  ?  ?Procedure:      ?  ?Transcatheter Aortic Valve Replacement - Percutaneous Left Transfemoral Approach ?            Edwards Sapien 3 Ultra Resilia THV (size 26 mm, model # 9755RSL, serial # [id number]) ?             ?Co-Surgeons:                        Gaye Pollack, MD and Lauree Chandler, MD ?  ?  ?Anesthesiologist:                  Therisa Doyne, MD ?  ?Echocardiographer:              Osborne Oman, MD. ?  ?Pre-operative Echo Findings: ?Severe aortic stenosis ?Normal left ventricular systolic function ?  ?Post-operative Echo Findings: ?Trivial. paravalvular leak ?Normal left ventricular systolic function ?  ?_____________ ?  ?  ?Echo 01/29/22: ?IMPRESSIONS ? 1. The aortic valve has been replaced by a 26 mm Edwards Sapien valve. No central or paravalvular leak. Effective orifice area, by VTI measures 1.67  ?cm?Marland Kitchen Aortic valve mean gradient measures 9.0 mmHg. Peak Gradient 14 mm Hg.  ?DVI 0.56.  ? 2. Left ventricular ejection fraction, by estimation, is 55%. The left  ?ventricle has normal function. The left ventricle has no regional wall  ?motion abnormalities. There is moderate concentric left ventricular  ?hypertrophy. Left ventricular diastolic  ?parameters are consistent with Grade II diastolic dysfunction   ?(pseudonormalization).  ? 3. Right ventricular systolic function is hyperdynamic. The right  ?ventricular size is normal.  ? 4. Left atrial size was moderately dilated.  ? 5. The mitral valve is abnormal. Mild mit

## 2022-02-03 NOTE — Telephone Encounter (Signed)
From patient.

## 2022-02-03 NOTE — Telephone Encounter (Signed)
I have not seen him since TAVR. He has f/u w/TAVR clinic on 5/3. I would continue until seen by them that day. ? ?Thanks ?MJP ?

## 2022-02-04 DIAGNOSIS — I5030 Unspecified diastolic (congestive) heart failure: Secondary | ICD-10-CM | POA: Diagnosis not present

## 2022-02-04 DIAGNOSIS — I10 Essential (primary) hypertension: Secondary | ICD-10-CM | POA: Diagnosis not present

## 2022-02-04 DIAGNOSIS — M4807 Spinal stenosis, lumbosacral region: Secondary | ICD-10-CM | POA: Diagnosis not present

## 2022-02-04 DIAGNOSIS — I350 Nonrheumatic aortic (valve) stenosis: Secondary | ICD-10-CM | POA: Diagnosis not present

## 2022-02-05 ENCOUNTER — Ambulatory Visit (INDEPENDENT_AMBULATORY_CARE_PROVIDER_SITE_OTHER): Payer: Medicare Other | Admitting: Physician Assistant

## 2022-02-05 VITALS — BP 120/60 | HR 93 | Ht 66.0 in | Wt 145.8 lb

## 2022-02-05 DIAGNOSIS — Z952 Presence of prosthetic heart valve: Secondary | ICD-10-CM | POA: Diagnosis not present

## 2022-02-05 DIAGNOSIS — I5032 Chronic diastolic (congestive) heart failure: Secondary | ICD-10-CM | POA: Diagnosis not present

## 2022-02-05 DIAGNOSIS — N1832 Chronic kidney disease, stage 3b: Secondary | ICD-10-CM

## 2022-02-05 DIAGNOSIS — E876 Hypokalemia: Secondary | ICD-10-CM | POA: Diagnosis not present

## 2022-02-05 DIAGNOSIS — I10 Essential (primary) hypertension: Secondary | ICD-10-CM

## 2022-02-05 MED ORDER — AMOXICILLIN 500 MG PO TABS: 2000.0000 mg | ORAL_TABLET | ORAL | 12 refills | Status: DC

## 2022-02-05 NOTE — Progress Notes (Signed)
This can just be chronic diastolic CHF ?

## 2022-02-05 NOTE — Progress Notes (Signed)
It was present prior to admission  ?

## 2022-02-05 NOTE — Patient Instructions (Signed)
Medication Instructions:  ?Start Amoxicillin 500 mg, take 4 tablets by mouth 1 hour before dental procedures and cleanings  ?Decrease Torsemide to 20 mg daily  ?Stop Potassium  ? ?*If you need a refill on your cardiac medications before your next appointment, please call your pharmacy* ? ? ?Lab Work: ?[place] - today  ? ?If you have labs (blood work) drawn today and your tests are completely normal, you will receive your results only by: ?MyChart Message (if you have MyChart) OR ?A paper copy in the mail ?If you have any lab test that is abnormal or we need to change your treatment, we will call you to review the results. ? ? ?Testing/Procedures: ?None ordered  ? ? ?Follow-Up: ?Follow up as scheduled  ? ? ?Other Instructions ?Please fax your lab results to Katie @ [phone] ? ?Important Information About Sugar ? ? ? ? ?  ?

## 2022-02-12 DIAGNOSIS — R609 Edema, unspecified: Secondary | ICD-10-CM | POA: Diagnosis not present

## 2022-02-12 DIAGNOSIS — N481 Balanitis: Secondary | ICD-10-CM | POA: Diagnosis not present

## 2022-02-12 DIAGNOSIS — R350 Frequency of micturition: Secondary | ICD-10-CM | POA: Diagnosis not present

## 2022-02-24 NOTE — Progress Notes (Unsigned)
[facility] [place]                                     Cardiology Office Note:    Date:  02/26/2022   ID:  Marcus Powers, DOB 11-14-1927, MRN [id number]  PCP:  Lavone Orn, MD  [facility] Cardiologist:  Nigel Mormon, MD / Dr Angelena Form & Dr. Cyndia Bent (TAVR) [place] Electrophysiologist:  None   Referring MD: Lavone Orn, MD   1 month s/p TAVR  History of Present Illness:    Marcus Powers is a 88 y.o. male with a hx of HTN, HLD, HFpEF with recurrent admissions and ER visits and severe aortic stenosis with moderate AI s/p TAVR (01/28/22) who presents to clinic for follow up.   He was admitted to the hospital in May 2022 with shortness of breath and found to have a BNP greater than 800.  An echocardiogram at that time show severe aortic stenosis with a mean gradient of 47.2 mmHg and a valve area of 0.9 cm by VTI. Ejection fraction is 50 to 55% with mild mitral regurgitation.  He was treated with intravenous Lasix with symptom resolution.  He was seen by Dr. Virgina Jock in July 2022 and was doing well and still mowing his yard and doing things around the house without shortness of breath or chest discomfort.  There was apparently some discussion about referring him for TAVR work-up but he was reluctant. Plans were made for repeat echocardiogram in a few months.  He had an echocardiogram in October 2022 showing a trileaflet aortic valve with a mean gradient of 38 mmHg and a valve area of 0.6 cm with a dimensionless index of 0.17 consistent with severe aortic stenosis.  There was moderate aortic insufficiency and moderate mitral regurgitation.  Left ventricular ejection fraction was 55% with moderate concentric LVH.  He continued to feel well and did not want to consider TAVR at that time because he was asymptomatic.     He had several readmissions for acute CHF. Most recent echo 12/13/21 showed EF 50-55%, G2DD, mean gradient at 35.0 mmHg,  peak gradient 56.0 mmHg, and AVA 0.31 cm2, DVI: 0.22, SVI: 18, mild to mod AI. L/RHC with mild non obst CAD. CT did show abdominal aortic dissection. He was most recently admitted 4/18-4/20/23. He was treated with IV lasix and Lasix switched to torsemide. He was seen by Dr. Cyndia Bent during this admission and plans were made to bring him back for TAVR 01/28/22.  He underwent successful TAVR with a 26 mm Edwards Sapien 3 Ultra Resilia THV via the TF approach on 01/28/22. Post operative echo showed EF 55%, moderate LVH, normally functioning TAVR with a mean gradient of 9 mmHg and no PVL. He was discharged on POD1 on aspirin alone. Due to hypokalemia, Kdur was added. Additionally, he was hypertensive requiring a nitro gtt and was started on Norvasc 5mg  daily at discharge.   He had done quite well in follow up. At last visit we decreased torsemide from 20mg  BID to 20mg  daily and stopped Kdur.   Today the patient presents to clinic for follow up. Here with son in law. No CP or SOB. No LE edema, orthopnea or PND. No dizziness or syncope. No blood in stool or urine. No palpitations. Stopped taking torsemide yesterday and does not want to take it ever again due to urinary frequency.  Past Medical History:  Diagnosis Date   Chronic diastolic heart failure (HCC)    Hyperlipidemia    Hypertension    S/P TAVR (transcatheter aortic valve replacement) 01/28/2022   s/p TAVR with a 26 mm Edwards S3UR via the TF approach by Dr. Angelena Form & Dr. Cyndia Bent   Severe aortic stenosis    Spinal stenosis     Past Surgical History:  Procedure Laterality Date   BIOPSY BREAST     EYE SURGERY Bilateral    RIGHT HEART CATH AND CORONARY ANGIOGRAPHY N/A 12/13/2021   Procedure: RIGHT HEART CATH AND CORONARY ANGIOGRAPHY;  Surgeon: Nigel Mormon, MD;  Location: [place] CV LAB;  Service: Cardiovascular;  Laterality: N/A;   TEE WITHOUT CARDIOVERSION N/A 01/28/2022   Procedure: TRANSESOPHAGEAL ECHOCARDIOGRAM (TEE);   Surgeon: Burnell Blanks, MD;  Location: [place];  Service: Open Heart Surgery;  Laterality: N/A;   TONSILLECTOMY     TRANSCATHETER AORTIC VALVE REPLACEMENT, TRANSFEMORAL N/A 01/28/2022   Procedure: Transcatheter Aortic Valve Replacement, Transfemoral;  Surgeon: Burnell Blanks, MD;  Location: [place];  Service: Open Heart Surgery;  Laterality: N/A;   ULTRASOUND GUIDANCE FOR VASCULAR ACCESS Bilateral 01/28/2022   Procedure: ULTRASOUND GUIDANCE FOR VASCULAR ACCESS;  Surgeon: Burnell Blanks, MD;  Location: [place];  Service: Open Heart Surgery;  Laterality: Bilateral;    Current Medications: Current Meds  Medication Sig   acetaminophen (TYLENOL) 650 MG CR tablet Take 650 mg by mouth daily.   amLODipine (NORVASC) 5 MG tablet Take 1 tablet (5 mg total) by mouth daily.   amoxicillin (AMOXIL) 500 MG tablet Take 4 tablets (2,000 mg total) by mouth as directed. 1 hour prior to dental work including cleanings   aspirin 81 MG chewable tablet Chew 1 tablet (81 mg total) by mouth daily.   pravastatin (PRAVACHOL) 20 MG tablet Take 20 mg by mouth daily.   torsemide (DEMADEX) 20 MG tablet Take 20 mg by mouth as needed.     Allergies:   Benazepril hcl   Social History   Socioeconomic History   Marital status: Married    Spouse name: Not on file   Number of children: Not on file   Years of education: Not on file   Highest education level: Not on file  Occupational History   Not on file  Tobacco Use   Smoking status: Former    Packs/day: 0.25    Years: 5.00    Pack years: 1.25    Types: Cigarettes    Quit date: 77    Years since quitting: 54.4   Smokeless tobacco: Never  Vaping Use   Vaping Use: Never used  Substance and Sexual Activity   Alcohol use: No   Drug use: No   Sexual activity: Not on file  Other Topics Concern   Not on file  Social History Narrative   Not on file   Social Determinants of Health   Financial Resource Strain: Not on file  Food  Insecurity: Not on file  Transportation Needs: Not on file  Physical Activity: Not on file  Stress: Not on file  Social Connections: Not on file     Family History: The patient's family history includes Cancer in his mother; Stroke in his father.  ROS:   Please see the history of present illness.    All other systems reviewed and are negative.  EKGs/Labs/Other Studies Reviewed:    The following studies were reviewed today:  TAVR OPERATIVE NOTE     Date  of Procedure:                01/28/2022   Preoperative Diagnosis:      Severe Aortic Stenosis    Postoperative Diagnosis:    Same    Procedure:        Transcatheter Aortic Valve Replacement - Percutaneous Left Transfemoral Approach             Edwards Sapien 3 Ultra Resilia THV (size 26 mm, model # 9755RSL, serial # [id number])              Co-Surgeons:                        Gaye Pollack, MD and Lauree Chandler, MD     Anesthesiologist:                  Therisa Doyne, MD   Echocardiographer:              Osborne Oman, MD.   Pre-operative Echo Findings: Severe aortic stenosis Normal left ventricular systolic function   Post-operative Echo Findings: Trivial. paravalvular leak Normal left ventricular systolic function   _____________     Echo 01/29/22: IMPRESSIONS  1. The aortic valve has been replaced by a 26 mm Edwards Sapien valve. No central or paravalvular leak. Effective orifice area, by VTI measures 1.67  cm. Aortic valve mean gradient measures 9.0 mmHg. Peak Gradient 14 mm Hg.  DVI 0.56.   2. Left ventricular ejection fraction, by estimation, is 55%. The left  ventricle has normal function. The left ventricle has no regional wall  motion abnormalities. There is moderate concentric left ventricular  hypertrophy. Left ventricular diastolic  parameters are consistent with Grade II diastolic dysfunction  (pseudonormalization).   3. Right ventricular systolic function is hyperdynamic. The right   ventricular size is normal.   4. Left atrial size was moderately dilated.   5. The mitral valve is abnormal. Mild mitral valve regurgitation. The  mean mitral valve gradient is 3.0 mmHg with average heart rate of 98 bpm.   Comparison(s): Slightly increased gradients, LV is more vigorous from  prior.   _____________________  Echo 02/26/22 IMPRESSIONS  1. Left ventricular ejection fraction, by estimation, is 55 to 60%. The left ventricle has normal function. The left ventricle has no regional wall motion abnormalities. Left ventricular diastolic parameters are consistent with Grade I diastolic  dysfunction (impaired relaxation).  2. Right ventricular systolic function is normal. The right ventricular size is normal. There is normal pulmonary artery systolic pressure. The estimated right ventricular systolic pressure is 99991111 mmHg.  3. Left atrial size was mild to moderately dilated.  4. The mitral valve is normal in structure. Mild mitral valve regurgitation.  5. The aortic valve has been repaired/replaced. Aortic valve regurgitation is not visualized. There is a 26 mm Sapien prosthetic (TAVR) valve present in the aortic position. Procedure Date: 01/28/2022. Aortic valve mean gradient measures 8.0 mmHg.  Aortic valve Vmax measures 1.93 m/s. Aortic valve acceleration time measures 92 msec.   Comparison(s): Prior images reviewed side by side. TAVR gradients are unchanged.  EKG:  EKG is NOT ordered today.    Recent Labs: 01/22/2022: B Natriuretic Peptide 799.1 01/24/2022: ALT 23 01/29/2022: BUN 29; Creatinine, Ser 1.48; Hemoglobin 10.9; Magnesium 2.2; Platelets 156; Potassium 3.4; Sodium 135  Recent Lipid Panel No results found for: CHOL, TRIG, HDL, CHOLHDL, VLDL, LDLCALC, LDLDIRECT   Risk Assessment/Calculations:  Physical Exam:    VS:  BP 132/68   Pulse 75   Ht 5\' 6"  (1.676 m)   Wt 143 lb 12.8 oz (65.2 kg)   SpO2 98%   BMI 23.21 kg/m     Vitals:   02/26/22 1340  BP:  132/68  Pulse: 75  SpO2: 98%    Wt Readings from Last 3 Encounters:  02/26/22 143 lb 12.8 oz (65.2 kg)  02/05/22 145 lb 12.8 oz (66.1 kg)  01/29/22 145 lb 1.6 oz (65.8 kg)     GEN:  Well nourished, well developed in no acute distress HEENT: Normal NECK: No JVD LYMPHATICS: No lymphadenopathy CARDIAC: RRR, no murmurs, rubs, gallops RESPIRATORY:  Clear to auscultation without rales, wheezing or rhonchi  ABDOMEN: Soft, non-tender, non-distended MUSCULOSKELETAL:  No edema; No deformity  SKIN: Warm and dry.  NEUROLOGIC:  Alert and oriented x 3 PSYCHIATRIC:  Normal affect   ASSESSMENT:    1. S/P TAVR (transcatheter aortic valve replacement)   2. Chronic heart failure with preserved ejection fraction (HCC)   3. Stage 3b chronic kidney disease ([place])   4. Essential hypertension     PLAN:    In order of problems listed above:  Severe AS s/p TAVR: echo today shows EF 55%, normally functioning TAVR with a mean gradient of 8 mm hg and no PVL as well as mild MR/TR. He has NYHA class I symptoms. SBE prophylaxis discussed; he has amoxicillin. Continue on aspirin 81 mg daily. I will see him back in the office in 1 year for follow up and echo.    Chronic diastolic CHF: appears euvolemic. Stopped taking torsemide 20mg  daily yesterday and does not want to take it again. Will change it to PRN for swelling or weight gain. Discussed low salt diet.    CKD stage IIIb: baseline creat 1.2-1.6. as above, stopped torsemide.   HTN: Bp well controlled today. No changes made.    Medication Adjustments/Labs and Tests Ordered: Current medicines are reviewed at length with the patient today.  Concerns regarding medicines are outlined above.  No orders of the defined types were placed in this encounter.  No orders of the defined types were placed in this encounter.   Patient Instructions  Medication Instructions:  Decrease Torsemide to as needed   *If you need a refill on your cardiac medications  before your next appointment, please call your pharmacy*   Lab Work: None ordered   If you have labs (blood work) drawn today and your tests are completely normal, you will receive your results only by: [place] (if you have MyChart) OR A paper copy in the mail If you have any lab test that is abnormal or we need to change your treatment, we will call you to review the results.   Testing/Procedures: None ordered    Follow-Up: Follow up as scheduled    Other Instructions  Important Information About Sugar         Signed, Angelena Form, PA-C  02/26/2022 3:11 PM    [facility]

## 2022-02-26 ENCOUNTER — Other Ambulatory Visit: Payer: Self-pay | Admitting: Physician Assistant

## 2022-02-26 ENCOUNTER — Ambulatory Visit: Payer: Medicare Other | Admitting: Physician Assistant

## 2022-02-26 ENCOUNTER — Ambulatory Visit (HOSPITAL_COMMUNITY): Payer: Medicare Other | Attending: Cardiovascular Disease

## 2022-02-26 VITALS — BP 132/68 | HR 75 | Ht 66.0 in | Wt 143.8 lb

## 2022-02-26 DIAGNOSIS — I10 Essential (primary) hypertension: Secondary | ICD-10-CM

## 2022-02-26 DIAGNOSIS — Z952 Presence of prosthetic heart valve: Secondary | ICD-10-CM | POA: Diagnosis not present

## 2022-02-26 DIAGNOSIS — N1832 Chronic kidney disease, stage 3b: Secondary | ICD-10-CM

## 2022-02-26 DIAGNOSIS — I5032 Chronic diastolic (congestive) heart failure: Secondary | ICD-10-CM | POA: Diagnosis not present

## 2022-02-26 LAB — ECHOCARDIOGRAM COMPLETE
AR max vel: 2.52 cm2
AV Area VTI: 2.26 cm2
AV Area mean vel: 2.51 cm2
AV Mean grad: 8 mmHg
AV Peak grad: 14.9 mmHg
Ao pk vel: 1.93 m/s
Area-P 1/2: 1.82 cm2
S' Lateral: 2.8 cm

## 2022-02-26 NOTE — Patient Instructions (Signed)
Medication Instructions:  Decrease Torsemide to as needed   *If you need a refill on your cardiac medications before your next appointment, please call your pharmacy*   Lab Work: None ordered   If you have labs (blood work) drawn today and your tests are completely normal, you will receive your results only by: MyChart Message (if you have MyChart) OR A paper copy in the mail If you have any lab test that is abnormal or we need to change your treatment, we will call you to review the results.   Testing/Procedures: None ordered    Follow-Up: Follow up as scheduled    Other Instructions  Important Information About Sugar

## 2022-02-27 DIAGNOSIS — N481 Balanitis: Secondary | ICD-10-CM | POA: Diagnosis not present

## 2022-02-27 DIAGNOSIS — N390 Urinary tract infection, site not specified: Secondary | ICD-10-CM | POA: Diagnosis not present

## 2022-03-21 ENCOUNTER — Encounter: Payer: Self-pay | Admitting: Cardiology

## 2022-03-21 ENCOUNTER — Ambulatory Visit: Payer: Medicare Other | Admitting: Cardiology

## 2022-03-21 VITALS — BP 137/70 | HR 72 | Temp 98.3°F | Resp 17 | Ht 66.0 in | Wt 144.8 lb

## 2022-03-21 DIAGNOSIS — I10 Essential (primary) hypertension: Secondary | ICD-10-CM | POA: Diagnosis not present

## 2022-03-21 DIAGNOSIS — Z952 Presence of prosthetic heart valve: Secondary | ICD-10-CM

## 2022-03-21 MED ORDER — ASPIRIN 81 MG PO CHEW: 81.0000 mg | CHEWABLE_TABLET | Freq: Every day | ORAL | 3 refills | Status: AC

## 2022-03-21 MED ORDER — AMOXICILLIN 500 MG PO TABS: 2000.0000 mg | ORAL_TABLET | ORAL | 12 refills | Status: DC

## 2022-03-21 NOTE — Progress Notes (Signed)
Patient referred by Lavone Orn, MD for aortic stenosis  Subjective:   Marcus Powers, male    DOB: Mar 13, 1928, 88 y.o.   MRN: [id number]   Chief Complaint  Patient presents with   Aortic Stenosis     HPI  88 y.o. African-American male with hypertension, hyperlipidemia, s/p TAVR for severe AS   Patient is here with his son today.  He is doing well and denies any significant shortness of breath symptoms.  He is out of his amlodipine as he tells me.  He still has edema in both his legs.  Patient was prescribed aspirin and amoxicillin after TAVR.  Surprisingly, he has not been taking aspirin.  He also seems to be oblivious to the fact that he needs to take antibiotics before a dental procedure, fortunately has not had to have any since TAVR.    Current Outpatient Medications:    acetaminophen (TYLENOL) 650 MG CR tablet, Take 650 mg by mouth daily., Disp: , Rfl:    amLODipine (NORVASC) 5 MG tablet, Take 1 tablet (5 mg total) by mouth daily., Disp: 30 tablet, Rfl: 11   amoxicillin (AMOXIL) 500 MG tablet, Take 4 tablets (2,000 mg total) by mouth as directed. 1 hour prior to dental work including cleanings, Disp: 12 tablet, Rfl: 12   aspirin 81 MG chewable tablet, Chew 1 tablet (81 mg total) by mouth daily., Disp: , Rfl:    pravastatin (PRAVACHOL) 20 MG tablet, Take 20 mg by mouth daily., Disp: , Rfl:    torsemide (DEMADEX) 20 MG tablet, Take 20 mg by mouth as needed., Disp: , Rfl:     Cardiovascular and other pertinent studies:   Echocardiogram 02/26/2022:  1. Left ventricular ejection fraction, by estimation, is 55 to 60%. The  left ventricle has normal function. The left ventricle has no regional  wall motion abnormalities. Left ventricular diastolic parameters are  consistent with Grade I diastolic  dysfunction (impaired relaxation).   2. Right ventricular systolic function is normal. The right ventricular  size is normal. There is normal pulmonary artery systolic  pressure. The  estimated right ventricular systolic pressure is 93.7 mmHg.   3. Left atrial size was mild to moderately dilated.   4. The mitral valve is normal in structure. Mild mitral valve  regurgitation.   5. The aortic valve has been repaired/replaced. Aortic valve  regurgitation is not visualized. There is a 26 mm Sapien prosthetic (TAVR)  valve present in the aortic position. Procedure Date: 01/28/2022. Aortic  valve mean gradient measures 8.0 mmHg.  Aortic valve Vmax measures 1.93 m/s. Aortic valve acceleration time  measures 92 msec.   Comparison(s): Prior images reviewed side by side. TAVR gradients are  unchanged.   01/28/2022: 26 mm Sapien 3 TAVR by Dr. Burt Knack  EKG 12/20/2021: Sinus tachycardia 103 bpm  Left atrial enlargement Left ventricular hypertrophy ST-T changes related to LVH  Echocardiogram 12/13/2021: 1. Left ventricular ejection fraction, by estimation, is 50 to 55%. The  left ventricle has low normal function. The left ventricle has no regional  wall motion abnormalities. There is mild concentric left ventricular  hypertrophy. Left ventricular  diastolic parameters are consistent with Grade II diastolic dysfunction  (pseudonormalization).   2. Right ventricular systolic function is normal. The right ventricular  size is normal. There is mildly elevated pulmonary artery systolic  pressure.   3. Left atrial size was moderately dilated.   4. The mitral valve is degenerative. No evidence of mitral valve  regurgitation.  5. Difficult assessment for mitral stenosis, suboptimal valve morpology,  continuity equation valve area deferred in the setting of AI.   6. The aortic valve is calcified. Aortic valve regurgitation is mild to  moderate. Severe aortic valve stenosis. Aortic valve mean gradient  measures 35.0 mmHg. Aortic valve Vmax measures 3.74 m/s. Aortic valve  acceleration time measures 95 msec.  Decreased stroke voluem index.   7. The inferior vena  cava is normal in size with greater than 50%  respiratory variability, suggesting right atrial pressure of 3 mmHg.   RHC, coronary angiography 12/13/2021: LM: Normal LAD: Prox focal 20% stenosis Lcx: Normal RCA: Mid focal 20% stenosis   No obstructive coronary artery disease   RA: 7 mmHg RV: 44/1 mmHg PA: 34/12 mmHg, mPAP 27 mmHg PCW: 11 mmHg   CO: 4.9 L/min CI: 2.8 L/min/m2   Valvular cardiomyopathy, now compensated at rest NYHA class III   Okay to perform TAVR outpatient. Continue pre-TAVR workup.  Recent labs: 01/29/2022: Glucose 124, BUN/Cr 29/1.48. EGFR 44. Na/K 135/3.4.  H/H 10.9/33.1. MCV 96. Platelets 156  12/14/2021: Glucose 124, BUN/Cr 15/1.26. EGFR 54. Na/K 140/3.9. Rest of the CMP normal H/H 12/37. MCV 97. Platelets 192 BNP 960 Trop HS 95, 102, 105  03/05/2021: Glucose 95, BUN/Cr 16/1.18. EGFR 70.  HbA1C 6.3% Chol 223, TG 163, HDL 76, LDL 119 TSH N/A Trop HS 91, 91 BNP 826   Review of Systems  Cardiovascular:  Positive for leg swelling. Negative for chest pain, dyspnea on exertion, palpitations and syncope.         Vitals:   03/21/22 1122 03/21/22 1131  BP: (!) 157/66 137/70  Pulse: 77 72  Resp: 17   Temp: 98.3 F (36.8 C)   SpO2: 97% 97%    Body mass index is 23.37 kg/m. Filed Weights   03/21/22 1122  Weight: 144 lb 12.8 oz (65.7 kg)     Objective:   Physical Exam Vitals and nursing note reviewed.  Constitutional:      General: He is not in acute distress. Neck:     Vascular: No JVD.  Cardiovascular:     Rate and Rhythm: Normal rate and regular rhythm.     Pulses: Normal pulses.     Heart sounds: No murmur heard. Pulmonary:     Effort: Pulmonary effort is normal.     Breath sounds: Normal breath sounds. No wheezing or rales.  Musculoskeletal:     Right lower leg: Edema (1+) present.     Left lower leg: Edema (1+) present.        ICD-10-CM   1. Essential hypertension  I43 Basic metabolic panel    2. S/P TAVR  (transcatheter aortic valve replacement)  Z95.2      Meds ordered this encounter  Medications   amoxicillin (AMOXIL) 500 MG tablet    Sig: Take 4 tablets (2,000 mg total) by mouth as directed. 1 hour prior to dental work including cleanings    Dispense:  12 tablet    Refill:  12   aspirin 81 MG chewable tablet    Sig: Chew 1 tablet (81 mg total) by mouth daily.    Dispense:  90 tablet    Refill:  3         Assessment & Recommendations:     88 y.o. African-American male with hypertension, hyperlipidemia, s/p TAVR for severe AS  Aortic stenosis: Resolved s/p TAVR. Leg edema likely due to hypertension and not necessarily HFpEF. Reemphasized importance of aspirin 81  mg daily, and amoxicillin prior to any dental procedures.  Hypertension: Will check BMP today.  If renal function will allow, we will put him on spironolactone instead of amlodipine.  F/u in 3 months     Nigel Mormon, MD Pager: [phone] Office: [phone]

## 2022-03-22 LAB — BASIC METABOLIC PANEL
BUN/Creatinine Ratio: 11 (ref 10–24)
BUN: 12 mg/dL (ref 10–36)
CO2: 22 mmol/L (ref 20–29)
Calcium: 8.9 mg/dL (ref 8.6–10.2)
Chloride: 105 mmol/L (ref 96–106)
Creatinine, Ser: 1.09 mg/dL (ref 0.76–1.27)
Glucose: 106 mg/dL — ABNORMAL HIGH (ref 70–99)
Potassium: 3.8 mmol/L (ref 3.5–5.2)
Sodium: 140 mmol/L (ref 134–144)
eGFR: 64 mL/min/{1.73_m2} (ref >59)

## 2022-03-23 MED ORDER — SPIRONOLACTONE 25 MG PO TABS: 25.0000 mg | ORAL_TABLET | Freq: Every day | ORAL | 2 refills | Status: DC

## 2022-03-23 NOTE — Addendum Note (Signed)
Addended by: Elder Negus on: 03/23/2022 02:32 PM   Modules accepted: Orders

## 2022-03-23 NOTE — Progress Notes (Signed)
Renal function is good. Start spironolactone 25 mg daily in place of amlodipine/ Repeat BMP in 1 week.  Thanks MJP

## 2022-03-25 NOTE — Progress Notes (Signed)
Called and spoke to patient's daughter she voiced understanding

## 2022-03-27 ENCOUNTER — Other Ambulatory Visit: Payer: Self-pay

## 2022-03-27 ENCOUNTER — Emergency Department (HOSPITAL_COMMUNITY): Payer: Medicare Other

## 2022-03-27 ENCOUNTER — Observation Stay (HOSPITAL_COMMUNITY)
Admission: EM | Admit: 2022-03-27 | Discharge: 2022-03-29 | Disposition: A | Discharge status: Home / Self Care | Payer: Medicare Other | Attending: Internal Medicine | Admitting: Internal Medicine

## 2022-03-27 ENCOUNTER — Inpatient Hospital Stay (HOSPITAL_COMMUNITY): Payer: Medicare Other

## 2022-03-27 ENCOUNTER — Encounter (HOSPITAL_COMMUNITY): Payer: Self-pay | Admitting: Emergency Medicine

## 2022-03-27 DIAGNOSIS — J3489 Other specified disorders of nose and nasal sinuses: Secondary | ICD-10-CM | POA: Diagnosis not present

## 2022-03-27 DIAGNOSIS — E785 Hyperlipidemia, unspecified: Secondary | ICD-10-CM | POA: Diagnosis not present

## 2022-03-27 DIAGNOSIS — I639 Cerebral infarction, unspecified: Secondary | ICD-10-CM | POA: Diagnosis not present

## 2022-03-27 DIAGNOSIS — I5032 Chronic diastolic (congestive) heart failure: Secondary | ICD-10-CM | POA: Diagnosis not present

## 2022-03-27 DIAGNOSIS — Z952 Presence of prosthetic heart valve: Secondary | ICD-10-CM

## 2022-03-27 DIAGNOSIS — N1832 Chronic kidney disease, stage 3b: Secondary | ICD-10-CM | POA: Insufficient documentation

## 2022-03-27 DIAGNOSIS — I130 Hypertensive heart and chronic kidney disease with heart failure and stage 1 through stage 4 chronic kidney disease, or unspecified chronic kidney disease: Secondary | ICD-10-CM | POA: Insufficient documentation

## 2022-03-27 DIAGNOSIS — R6889 Other general symptoms and signs: Secondary | ICD-10-CM | POA: Diagnosis not present

## 2022-03-27 DIAGNOSIS — I10 Essential (primary) hypertension: Secondary | ICD-10-CM | POA: Diagnosis not present

## 2022-03-27 DIAGNOSIS — Z7982 Long term (current) use of aspirin: Secondary | ICD-10-CM | POA: Diagnosis not present

## 2022-03-27 DIAGNOSIS — R29810 Facial weakness: Secondary | ICD-10-CM | POA: Diagnosis not present

## 2022-03-27 DIAGNOSIS — Z79899 Other long term (current) drug therapy: Secondary | ICD-10-CM | POA: Diagnosis not present

## 2022-03-27 DIAGNOSIS — N179 Acute kidney failure, unspecified: Secondary | ICD-10-CM | POA: Diagnosis not present

## 2022-03-27 DIAGNOSIS — G459 Transient cerebral ischemic attack, unspecified: Secondary | ICD-10-CM | POA: Diagnosis not present

## 2022-03-27 DIAGNOSIS — R2990 Unspecified symptoms and signs involving the nervous system: Secondary | ICD-10-CM | POA: Diagnosis not present

## 2022-03-27 DIAGNOSIS — R9431 Abnormal electrocardiogram [ECG] [EKG]: Secondary | ICD-10-CM | POA: Diagnosis not present

## 2022-03-27 DIAGNOSIS — Z87891 Personal history of nicotine dependence: Secondary | ICD-10-CM | POA: Diagnosis not present

## 2022-03-27 DIAGNOSIS — R29818 Other symptoms and signs involving the nervous system: Secondary | ICD-10-CM | POA: Diagnosis not present

## 2022-03-27 DIAGNOSIS — Z743 Need for continuous supervision: Secondary | ICD-10-CM | POA: Diagnosis not present

## 2022-03-27 DIAGNOSIS — Z20822 Contact with and (suspected) exposure to covid-19: Secondary | ICD-10-CM | POA: Insufficient documentation

## 2022-03-27 DIAGNOSIS — I6782 Cerebral ischemia: Secondary | ICD-10-CM | POA: Diagnosis not present

## 2022-03-27 DIAGNOSIS — G319 Degenerative disease of nervous system, unspecified: Secondary | ICD-10-CM | POA: Diagnosis not present

## 2022-03-27 DIAGNOSIS — R4781 Slurred speech: Secondary | ICD-10-CM | POA: Diagnosis not present

## 2022-03-27 DIAGNOSIS — I700 Atherosclerosis of aorta: Secondary | ICD-10-CM | POA: Diagnosis not present

## 2022-03-27 LAB — COMPREHENSIVE METABOLIC PANEL
ALT: 13 U/L (ref 0–44)
AST: 26 U/L (ref 15–41)
Albumin: 4.2 g/dL (ref 3.5–5.0)
Alkaline Phosphatase: 71 U/L (ref 38–126)
Anion gap: 13 (ref 5–15)
BUN: 20 mg/dL (ref 8–23)
CO2: 25 mmol/L (ref 22–32)
Calcium: 9.6 mg/dL (ref 8.9–10.3)
Chloride: 104 mmol/L (ref 98–111)
Creatinine, Ser: 1.69 mg/dL — ABNORMAL HIGH (ref 0.61–1.24)
GFR, Estimated: 38 mL/min — ABNORMAL LOW (ref >60)
Glucose, Bld: 121 mg/dL — ABNORMAL HIGH (ref 70–99)
Potassium: 4.2 mmol/L (ref 3.5–5.1)
Sodium: 142 mmol/L (ref 135–145)
Total Bilirubin: 1.3 mg/dL — ABNORMAL HIGH (ref 0.3–1.2)
Total Protein: 7.2 g/dL (ref 6.5–8.1)

## 2022-03-27 LAB — URINALYSIS, ROUTINE W REFLEX MICROSCOPIC
Bilirubin Urine: NEGATIVE
Glucose, UA: NEGATIVE mg/dL
Hgb urine dipstick: NEGATIVE
Ketones, ur: NEGATIVE mg/dL
Nitrite: NEGATIVE
Protein, ur: NEGATIVE mg/dL
Specific Gravity, Urine: 1.009 (ref 1.005–1.030)
pH: 5 (ref 5.0–8.0)

## 2022-03-27 LAB — DIFFERENTIAL
Abs Immature Granulocytes: 0.02 10*3/uL (ref 0.00–0.07)
Basophils Absolute: 0.1 10*3/uL (ref 0.0–0.1)
Basophils Relative: 1 %
Eosinophils Absolute: 0.1 10*3/uL (ref 0.0–0.5)
Eosinophils Relative: 1 %
Immature Granulocytes: 0 %
Lymphocytes Relative: 22 %
Lymphs Abs: 1.8 10*3/uL (ref 0.7–4.0)
Monocytes Absolute: 1 10*3/uL (ref 0.1–1.0)
Monocytes Relative: 12 %
Neutro Abs: 5.4 10*3/uL (ref 1.7–7.7)
Neutrophils Relative %: 64 %

## 2022-03-27 LAB — RAPID URINE DRUG SCREEN, HOSP PERFORMED
Amphetamines: NOT DETECTED
Barbiturates: NOT DETECTED
Benzodiazepines: NOT DETECTED
Cocaine: NOT DETECTED
Opiates: NOT DETECTED
Tetrahydrocannabinol: NOT DETECTED

## 2022-03-27 LAB — OSMOLALITY, URINE: Osmolality, Ur: 342 mOsm/kg (ref 300–900)

## 2022-03-27 LAB — SODIUM, URINE, RANDOM: Sodium, Ur: 107 mmol/L

## 2022-03-27 LAB — I-STAT CHEM 8, ED
BUN: 23 mg/dL (ref 8–23)
Calcium, Ion: 1.12 mmol/L — ABNORMAL LOW (ref 1.15–1.40)
Chloride: 104 mmol/L (ref 98–111)
Creatinine, Ser: 1.8 mg/dL — ABNORMAL HIGH (ref 0.61–1.24)
Glucose, Bld: 119 mg/dL — ABNORMAL HIGH (ref 70–99)
HCT: 38 % — ABNORMAL LOW (ref 39.0–52.0)
Hemoglobin: 12.9 g/dL — ABNORMAL LOW (ref 13.0–17.0)
Potassium: 3.9 mmol/L (ref 3.5–5.1)
Sodium: 141 mmol/L (ref 135–145)
TCO2: 26 mmol/L (ref 22–32)

## 2022-03-27 LAB — CBC
HCT: 39.8 % (ref 39.0–52.0)
Hemoglobin: 12.9 g/dL — ABNORMAL LOW (ref 13.0–17.0)
MCH: 31.7 pg (ref 26.0–34.0)
MCHC: 32.4 g/dL (ref 30.0–36.0)
MCV: 97.8 fL (ref 80.0–100.0)
Platelets: 173 10*3/uL (ref 150–400)
RBC: 4.07 MIL/uL — ABNORMAL LOW (ref 4.22–5.81)
RDW: 14.7 % (ref 11.5–15.5)
WBC: 8.3 10*3/uL (ref 4.0–10.5)
nRBC: 0 % (ref 0.0–0.2)

## 2022-03-27 LAB — APTT: aPTT: 23 seconds — ABNORMAL LOW (ref 24–36)

## 2022-03-27 LAB — PROTIME-INR
INR: 1 (ref 0.8–1.2)
Prothrombin Time: 13 seconds (ref 11.4–15.2)

## 2022-03-27 LAB — CBG MONITORING, ED: Glucose-Capillary: 133 mg/dL — ABNORMAL HIGH (ref 70–99)

## 2022-03-27 LAB — RESP PANEL BY RT-PCR (FLU A&B, COVID) ARPGX2
Influenza A by PCR: NEGATIVE
Influenza B by PCR: NEGATIVE
SARS Coronavirus 2 by RT PCR: NEGATIVE

## 2022-03-27 LAB — CREATININE, URINE, RANDOM: Creatinine, Urine: 73.44 mg/dL

## 2022-03-27 LAB — ETHANOL: Alcohol, Ethyl (B): <10 mg/dL (ref <10)

## 2022-03-27 MED ORDER — CLOPIDOGREL BISULFATE 75 MG PO TABS
75.0000 mg | ORAL_TABLET | Freq: Every day | ORAL | Status: DC
  Administered 2022-03-27 – 2022-03-29 (×3): 75 mg via ORAL
  Filled 2022-03-27 (×3): qty 1

## 2022-03-27 MED ORDER — SODIUM CHLORIDE 0.9 % IV SOLN: INTRAVENOUS | Status: DC

## 2022-03-27 NOTE — ED Notes (Signed)
Patient transported to MRI 

## 2022-03-27 NOTE — Assessment & Plan Note (Signed)
Hold Torsemide , given gently fluids Obtain urine electrolytes

## 2022-03-27 NOTE — ED Provider Notes (Incomplete)
Emergency Department Provider Note   I have reviewed the triage vital signs and the nursing notes.   HISTORY  Chief Complaint Code Stroke   HPI Marcus Powers is a 88 y.o. male with past history reviewed below presents to the emergency department with acute onset right face droop and slurred speech.  EMS was called by the patient's wife who noted symptoms.  Patient does not appreciate any abnormality.  He states he was at home watching TV when all of a sudden EMS showed up and said he was going to the hospital.  He denies any headache or chest discomfort.  No shortness of breath.  He does not appreciate any change in his neurologic exam/status.   Past Medical History:  Diagnosis Date   Chronic diastolic heart failure (HCC)    Hyperlipidemia    Hypertension    S/P TAVR (transcatheter aortic valve replacement) 01/28/2022   s/p TAVR with a 26 mm Edwards S3UR via the TF approach by Dr. Clifton James & Dr. Laneta Simmers   Severe aortic stenosis    Spinal stenosis     Review of Systems  Constitutional: No fever/chills Eyes: No visual changes. ENT: No sore throat. Cardiovascular: Denies chest pain. Respiratory: Denies shortness of breath. Gastrointestinal: No abdominal pain.  No nausea, no vomiting.  No diarrhea.  No constipation. Genitourinary: Negative for dysuria. Musculoskeletal: Negative for back pain. Skin: Negative for rash. Neurological: Negative for headaches. Positive change in speech with right face droop (family/EMS report).    ____________________________________________   PHYSICAL EXAM:  VITAL SIGNS: ED Triage Vitals [03/27/22 1700]  Enc Vitals Group     BP      Pulse      Resp      Temp      Temp src      SpO2      Weight 149 lb 7.6 oz (67.8 kg)     Height      Head Circumference      Peak Flow      Pain Score      Pain Loc      Pain Edu?      Excl. in GC?    {** Revise as appropriate then delete this line - 8 systems required **} Constitutional: Alert  and oriented. Well appearing and in no acute distress. Eyes: Conjunctivae are normal. PERRL. EOMI. Head: Atraumatic. {**Ears:  Healthy appearing ear canals and TMs bilaterally **}Nose: No congestion/rhinnorhea. Mouth/Throat: Mucous membranes are moist.  Oropharynx non-erythematous. Neck: No stridor.  No meningeal signs.  {**No cervical spine tenderness to palpation.**} Cardiovascular: Normal rate, regular rhythm. Good peripheral circulation. Grossly normal heart sounds.   Respiratory: Normal respiratory effort.  No retractions. Lungs CTAB. Gastrointestinal: Soft and nontender. No distention.  {**Genitourinary:  **}Musculoskeletal: No lower extremity tenderness nor edema. No gross deformities of extremities. Neurologic:  Normal speech and language. No gross focal neurologic deficits are appreciated.  Skin:  Skin is warm, dry and intact. No rash noted. {**Psychiatric: Mood and affect are normal. Speech and behavior are normal.**}  ____________________________________________   LABS (all labs ordered are listed, but only abnormal results are displayed)  Labs Reviewed  CBG MONITORING, ED - Abnormal; Notable for the following components:      Result Value   Glucose-Capillary 133 (*)    All other components within normal limits  RESP PANEL BY RT-PCR (FLU A&B, COVID) ARPGX2  ETHANOL  PROTIME-INR  APTT  CBC  DIFFERENTIAL  COMPREHENSIVE METABOLIC PANEL  RAPID URINE DRUG SCREEN,  HOSP PERFORMED  URINALYSIS, ROUTINE W REFLEX MICROSCOPIC  I-STAT CHEM 8, ED   ____________________________________________  EKG  *** ____________________________________________  RADIOLOGY  No results found.  ____________________________________________   PROCEDURES  Procedure(s) performed:   Procedures   ____________________________________________   INITIAL IMPRESSION / ASSESSMENT AND PLAN / ED COURSE  Pertinent labs & imaging results that were available during my care of the patient  were reviewed by me and considered in my medical decision making (see chart for details).   This patient is Presenting for Evaluation of AMS, which does require a range of treatment options, and is a complaint that involves a high risk of morbidity and mortality.  The Differential Diagnoses includes but is not exclusive to alcohol, illicit or prescription medications, intracranial pathology such as stroke, intracerebral hemorrhage, fever or infectious causes including sepsis, hypoxemia, uremia, trauma, endocrine related disorders such as diabetes, hypoglycemia, thyroid-related diseases, etc.   Critical Interventions-    Medications - No data to display  Reassessment after intervention:     I did obtain Additional Historical Information from EMS.  I decided to review pertinent External Data, and in summary ***.   Clinical Laboratory Tests Ordered, included ***  Radiologic Tests Ordered, included ***. I independently interpreted the images and agree with radiology interpretation.   Cardiac Monitor Tracing which shows NSR.  Social Determinants of Health Risk patient is a former smoker.   Consult complete with  Medical Decision Making: Summary:  Patient arrives as a code stroke with fairly mild deficits initially noted by family.  Evaluated initially in conjunction with neurology.  Patient going for CT and code stroke orders in place.   Reevaluation with update and discussion with   ***Considered admission***  Disposition:   ____________________________________________  FINAL CLINICAL IMPRESSION(S) / ED DIAGNOSES  Final diagnoses:  None     NEW OUTPATIENT MEDICATIONS STARTED DURING THIS VISIT:  New Prescriptions   No medications on file    Note:  This document was prepared using Dragon voice recognition software and may include unintentional dictation errors.  Alona Bene, MD, [facility]

## 2022-03-27 NOTE — Assessment & Plan Note (Signed)
Continue Pravachol 20 mg daily adjust as needed depending on lipid panel

## 2022-03-27 NOTE — Assessment & Plan Note (Signed)
Will need to continue on aspirin 81 mg a day for now

## 2022-03-27 NOTE — H&P (Signed)
Marcus Powers [id number] DOB: 1928-04-15 DOA: 03/27/2022     PCP: Lavone Orn, MD   Outpatient Specialists:  CARDS:   Dr. Virgina Jock    Patient arrived to ER on 03/27/22 at 1654 Referred by Attending Toy Baker, MD   Patient coming from:    home Lives  With family    Chief Complaint:   Chief Complaint  Patient presents with   Code Stroke    HPI: Marcus Powers is a 88 y.o. male with medical history significant of  with hypertension, hyperlipidemia, s/p TAVR for severe AS    Presented with slurred speech and right facial droop No new subjective & objective note has been filed under this hospital service since the last note was generated. Patient was sitting watching TV in the He denies EMS came Family noted slurred speech and called EMS about 4 PM. Denies any chest pain or shortness of breath no fevers no chills he walks at baseline with a walker no prior history of CVA. Patient is supposed to be on aspirin given history of TAVR Has not always been compliant with that. No recent fall Denies any weakness , no trouble walking  Lab Results  Component Value Date   SARSCOV2NAA NEGATIVE 01/24/2022   [place] NEGATIVE 01/19/2022   [place] NEGATIVE 12/12/2021   [place] NEGATIVE 02/09/2021     Regarding pertinent Chronic problems:      Hyperlipidemia -  on statins Pravachol (pravastatin)     HTN on Norvasc Torsemide as needed  chronic CHF diastolic- last echo [id number] 1 month s/p TAVR: EF 55%, normally functioning TAVR with a mean gradient of 8 mm hg and no PVL as well as mild MR/TR.    Chronic anemia - baseline hg Hemoglobin & Hematocrit  Recent Labs    01/29/22 0150 03/27/22 1657 03/27/22 1704  HGB 10.9* 12.9* 12.9*     While in ER:   Noted to have right-sided facial droop code stroke was called neurology came to assess him.  Not a candidate to TNK given resolving deficits and mild.  CT head unremarkable    Ordered  CT HEAD    NON acute Small chronic cortically-based infarct within the right parietal lobe.  CXR -  NON acute    Following Medications were ordered in ER: Medications - No data to display  _______________________________________________________ ER Provider Called:   Neurology Courtney NP They Recommend admit to medicine   admitted for stroke/TIA workup with brain MRI, MRA, echocardiogram, lipid panel and A1C.  SEEN in ER   ED Triage Vitals  Enc Vitals Group     BP 03/27/22 1715 (!) 130/101     Pulse Rate 03/27/22 1715 93     Resp 03/27/22 1715 (!) 22     Temp 03/27/22 1714 (!) 97.4 F (36.3 C)     Temp Source 03/27/22 1714 Oral     SpO2 03/27/22 1715 99 %     Weight 03/27/22 1700 149 lb 7.6 oz (67.8 kg)     Height --      Head Circumference --      Peak Flow --      Pain Score --      Pain Loc --      Pain Edu? --      Excl. in [place]? --   TMAX(24)@     _________________________________________ Significant initial  Findings: Abnormal Labs Reviewed  APTT - Abnormal; Notable for the following components:      Result Value  aPTT 23 (*)    All other components within normal limits  CBC - Abnormal; Notable for the following components:   RBC 4.07 (*)    Hemoglobin 12.9 (*)    All other components within normal limits  COMPREHENSIVE METABOLIC PANEL - Abnormal; Notable for the following components:   Glucose, Bld 121 (*)    Creatinine, Ser 1.69 (*)    Total Bilirubin 1.3 (*)    GFR, Estimated 38 (*)    All other components within normal limits  URINALYSIS, ROUTINE W REFLEX MICROSCOPIC - Abnormal; Notable for the following components:   Leukocytes,Ua SMALL (*)    Bacteria, UA FEW (*)    All other components within normal limits  CBG MONITORING, ED - Abnormal; Notable for the following components:   Glucose-Capillary 133 (*)    All other components within normal limits  I-STAT CHEM 8, ED - Abnormal; Notable for the following components:   Creatinine, Ser 1.80 (*)    Glucose, Bld  119 (*)    Calcium, Ion 1.12 (*)    Hemoglobin 12.9 (*)    HCT 38.0 (*)    All other components within normal limits    ECG: Ordered Personally reviewed and interpreted by me showing: HR : 92 Rhythm: Sinus rhythm Supraventricular bigeminy Left atrial enlargement Probable LVH with secondary repol abnrm QTC 438    The recent clinical data is shown below. Vitals:   03/27/22 1730 03/27/22 1745 03/27/22 1800 03/27/22 1815  BP: (!) 126/104 137/62 107/79 (!) 121/106  Pulse: 99 87 (!) 50 83  Resp: 17 18 15  (!) 22  Temp:      TempSrc:      SpO2: 100% 98% 98% 100%  Weight:        WBC     Component Value Date/Time   WBC 8.3 03/27/2022 1657   LYMPHSABS 1.8 03/27/2022 1657   MONOABS 1.0 03/27/2022 1657   EOSABS 0.1 03/27/2022 1657   BASOSABS 0.1 03/27/2022 1657      UA   no evidence of UTI      Urine analysis:    Component Value Date/Time   COLORURINE YELLOW 03/27/2022 1825   APPEARANCEUR CLEAR 03/27/2022 1825   LABSPEC 1.009 03/27/2022 1825   PHURINE 5.0 03/27/2022 1825   GLUCOSEU NEGATIVE 03/27/2022 1825   HGBUR NEGATIVE 03/27/2022 1825   BILIRUBINUR NEGATIVE 03/27/2022 1825   KETONESUR NEGATIVE 03/27/2022 1825   PROTEINUR NEGATIVE 03/27/2022 1825   NITRITE NEGATIVE 03/27/2022 1825   LEUKOCYTESUR SMALL (A) 03/27/2022 1825    Results for orders placed or performed during the hospital encounter of 01/24/22  Surgical pcr screen     Status: None   Collection Time: 01/24/22  3:03 PM   Specimen: Nasal Mucosa; Nasal Swab  Result Value Ref Range Status   MRSA, PCR NEGATIVE NEGATIVE Final   Staphylococcus aureus NEGATIVE NEGATIVE Final    Comment: (NOTE) The Xpert SA Assay (FDA approved for NASAL specimens in patients 30 years of age and older), is one component of a comprehensive surveillance program. It is not intended to diagnose infection nor to guide or monitor treatment. Performed at [facility], [address] [place], [place] [address]   SARS  CORONAVIRUS 2 (TAT 6-24 HRS) Nasopharyngeal Nasopharyngeal Swab     Status: None   Collection Time: 01/24/22  3:17 PM   Specimen: Nasopharyngeal Swab  Result Value Ref Range Status   SARS Coronavirus 2 NEGATIVE NEGATIVE Final    Comment: (NOTE) SARS-CoV-2 target  nucleic acids are NOT DETECTED.  The SARS-CoV-2 RNA is generally detectable in upper and lower respiratory specimens during the acute phase of infection. Negative results do not preclude SARS-CoV-2 infection, do not rule out co-infections with other pathogens, and should not be used as the sole basis for treatment or other patient management decisions. Negative results must be combined with clinical observations, patient history, and epidemiological information. The expected result is Negative.  Fact Sheet for Patients: HairSlick.no  Fact Sheet for Healthcare Providers: quierodirigir.com  This test is not yet approved or cleared by the [place] FDA and  has been authorized for detection and/or diagnosis of SARS-CoV-2 by FDA under an Emergency Use Authorization (EUA). This EUA will remain  in effect (meaning this test can be used) for the duration of the COVID-19 declaration under Se ction 564(b)(1) of the Act, 21 U.S.C. section 360bbb-3(b)(1), unless the authorization is terminated or revoked sooner.  Performed at [facility], [address], [place], [place] [address]      _______________________________________________ Hospitalist was called for admission for   Stroke-like symptoms     The following Work up has been ordered so far:  Orders Placed This Encounter  Procedures   Resp Panel by RT-PCR (Flu A&B, Covid) Anterior Nasal Swab   CT HEAD CODE STROKE WO CONTRAST   MR BRAIN WO CONTRAST   DG Chest Portable 1 View   MR BRAIN WO CONTRAST   MR ANGIO HEAD WO CONTRAST   Ethanol   Protime-INR   APTT   CBC   Differential   Comprehensive  metabolic panel   Urine rapid drug screen (hosp performed)   Urinalysis, Routine w reflex microscopic   Lipid panel   Hemoglobin A1c   Magnesium   Phosphorus   TSH   Diet NPO time specified   Vital signs   Cardiac Monitoring   Modified Stroke Scale (mNIHSS) Document mNIHSS assessment every 2 hours for a total of 12 hours   Swallow screen   Initiate Carrier Fluid Protocol   If O2 sat If O2 Sat < 94%, administer O2 at 2 liters/minute via nasal cannula.   Cardiac monitoring   Clerk to Activate Code Stroke   Consult to hospitalist   Pulse oximetry, continuous   CBG monitoring, ED   I-stat chem 8, ED   ED EKG   EKG 12-Lead   EKG 12-Lead   Saline lock IV   Admit to Inpatient (patient's expected length of stay will be greater than 2 midnights or inpatient only procedure)   VAS US CAROTID     OTHER Significant initial  Findings:  labs showing:  Recent Labs  Lab 03/21/22 1445 03/27/22 1657 03/27/22 1704  NA 140 142 141  K 3.8 4.2 3.9  CO2 22 25  --   GLUCOSE 106* 121* 119*  BUN 12 20 23   CREATININE 1.09 1.69* 1.80*  CALCIUM 8.9 9.6  --     Cr Up from baseline see below Lab Results  Component Value Date   CREATININE 1.80 (H) 03/27/2022   CREATININE 1.69 (H) 03/27/2022   CREATININE 1.09 03/21/2022    Recent Labs  Lab 03/27/22 1657  AST 26  ALT 13  ALKPHOS 71  BILITOT 1.3*  PROT 7.2  ALBUMIN 4.2   Lab Results  Component Value Date   CALCIUM 9.6 03/27/2022   PHOS 3.0 12/14/2021       Plt: Lab Results  Component Value Date   PLT 173 03/27/2022  COVID-19 Labs  No results for input(s): "DDIMER", "FERRITIN", "LDH", "CRP" in the last 72 hours.  Lab Results  Component Value Date   SARSCOV2NAA NEGATIVE 01/24/2022   SARSCOV2NAA NEGATIVE 01/19/2022   [place] NEGATIVE 12/12/2021   [place] NEGATIVE 02/09/2021       Recent Labs  Lab 03/27/22 1657 03/27/22 1704  WBC 8.3  --   NEUTROABS 5.4  --   HGB 12.9* 12.9*  HCT 39.8 38.0*  MCV  97.8  --   PLT 173  --     HG/HCT  stable,      Component Value Date/Time   HGB 12.9 (L) 03/27/2022 1704   HCT 38.0 (L) 03/27/2022 1704   MCV 97.8 03/27/2022 1657     BNP (last 3 results) Recent Labs    01/19/22 1426 01/21/22 0655 01/22/22 0155  BNP 868.2* 839.6* 799.1*    DM  labs:  HbA1C: No results for input(s): "HGBA1C" in the last 8760 hours.     CBG (last 3)  Recent Labs    03/27/22 1656  GLUCAP 133*      Cultures:    Component Value Date/Time   SDES URINE, CLEAN CATCH 01/21/2022 1135   SPECREQUEST NONE 01/21/2022 1135   CULT  01/21/2022 1135    NO GROWTH Performed at [facility], [address] [place], [place] [address]    REPTSTATUS 01/22/2022 FINAL 01/21/2022 1135     Radiological Exams on Admission: DG Chest Portable 1 View  Result Date: 03/27/2022 CLINICAL DATA:  CVA workup. EXAM: PORTABLE CHEST 1 VIEW COMPARISON:  Chest radiograph dated 01/24/2022. FINDINGS: No focal consolidation, pleural effusion, pneumothorax. The cardiac silhouette is within limits. Aortic valve repair. Atherosclerotic calcification of the aorta. No acute osseous pathology. IMPRESSION: No active disease. Electronically Signed   By: Anner Crete M.D.   On: 03/27/2022 19:17   CT HEAD CODE STROKE WO CONTRAST  Result Date: 03/27/2022 CLINICAL DATA:  Code stroke. Provided history: Neuro deficit, acute, stroke suspected. Additional history provided: Right facial droop, gait change, dysarthria. EXAM: CT HEAD WITHOUT CONTRAST TECHNIQUE: Contiguous axial images were obtained from the base of the skull through the vertex without intravenous contrast. RADIATION DOSE REDUCTION: This exam was performed according to the departmental dose-optimization program which includes automated exposure control, adjustment of the mA and/or kV according to patient size and/or use of iterative reconstruction technique. COMPARISON:  No pertinent prior exams available for comparison. FINDINGS: Mildly  motion degraded exam. Brain: Moderate generalized cerebral atrophy. Moderate patchy and ill-defined hypoattenuation within the cerebral white matter, nonspecific but compatible with chronic small vessel ischemic disease. Small chronic cortically based infarct within the right parietal lobe. There is no acute intracranial hemorrhage. No acute demarcated cortical infarct. No extra-axial fluid collection. No evidence of an intracranial mass. No midline shift. Vascular: No hyperdense vessel.  Atherosclerotic calcifications. Skull: No fracture or aggressive osseous lesion. Sinuses/Orbits: No mass or acute finding within the imaged orbits. Mild mucosal thickening within the left frontal, bilateral ethmoid, bilateral sphenoid and right maxillary sinuses at the imaged levels. ASPECTS ([place] Stroke Program Early CT Score) - Ganglionic level infarction (caudate, lentiform nuclei, internal capsule, insula, M1-M3 cortex): 7 - Supraganglionic infarction (M4-M6 cortex): 3 Total score (0-10 with 10 being normal): 10 These results were communicated to Dr. Quinn Axe At 5:15 pmon 6/22/2023by text page via the [facility] messaging system. IMPRESSION: No evidence of acute intracranial abnormality. Small chronic cortically-based infarct within the right parietal lobe. Moderate chronic small vessel ischemic changes within the  cerebral white matter. Moderate generalized cerebral atrophy. Mild paranasal sinus mucosal thickening. Electronically Signed   By: Kellie Simmering D.O.   On: 03/27/2022 17:16   _______________________________________________________________________________________________________ Latest  Blood pressure (!) 121/106, pulse 83, temperature (!) 97.4 F (36.3 C), temperature source Oral, resp. rate (!) 22, weight 67.8 kg, SpO2 100 %.   Vitals  labs and radiology finding personally reviewed  Review of Systems:    Pertinent positives include:  slurred speech,   Constitutional:  No weight loss, night sweats, Fevers,  chills, fatigue, weight loss  HEENT:  No headaches, Difficulty swallowing,Tooth/dental problems,Sore throat,  No sneezing, itching, ear ache, nasal congestion, post nasal drip,  Cardio-vascular:  No chest pain, Orthopnea, PND, anasarca, dizziness, palpitations.no Bilateral lower extremity swelling  GI:  No heartburn, indigestion, abdominal pain, nausea, vomiting, diarrhea, change in bowel habits, loss of appetite, melena, blood in stool, hematemesis Resp:  no shortness of breath at rest. No dyspnea on exertion, No excess mucus, no productive cough, No non-productive cough, No coughing up of blood.No change in color of mucus.No wheezing. Skin:  no rash or lesions. No jaundice GU:  no dysuria, change in color of urine, no urgency or frequency. No straining to urinate.  No flank pain.  Musculoskeletal:  No joint pain or no joint swelling. No decreased range of motion. No back pain.  Psych:  No change in mood or affect. No depression or anxiety. No memory loss.  Neuro: no localizing neurological complaints, no tingling, no weakness, no double vision, no gait abnormality, no no confusion  All systems reviewed and apart from [place] all are negative _______________________________________________________________________________________________ Past Medical History:   Past Medical History:  Diagnosis Date   Chronic diastolic heart failure (HCC)    Hyperlipidemia    Hypertension    S/P TAVR (transcatheter aortic valve replacement) 01/28/2022   s/p TAVR with a 26 mm Edwards S3UR via the TF approach by Dr. Angelena Form & Dr. Cyndia Bent   Severe aortic stenosis    Spinal stenosis       Past Surgical History:  Procedure Laterality Date   BIOPSY BREAST     EYE SURGERY Bilateral    RIGHT HEART CATH AND CORONARY ANGIOGRAPHY N/A 12/13/2021   Procedure: RIGHT HEART CATH AND CORONARY ANGIOGRAPHY;  Surgeon: Nigel Mormon, MD;  Location: [place] [facility];  Service: Cardiovascular;  Laterality:  N/A;   TEE WITHOUT CARDIOVERSION N/A 01/28/2022   Procedure: TRANSESOPHAGEAL ECHOCARDIOGRAM (TEE);  Surgeon: Burnell Blanks, MD;  Location: [place];  Service: Open Heart Surgery;  Laterality: N/A;   TONSILLECTOMY     TRANSCATHETER AORTIC VALVE REPLACEMENT, TRANSFEMORAL N/A 01/28/2022   Procedure: Transcatheter Aortic Valve Replacement, Transfemoral;  Surgeon: Burnell Blanks, MD;  Location: [place];  Service: Open Heart Surgery;  Laterality: N/A;   ULTRASOUND GUIDANCE FOR VASCULAR ACCESS Bilateral 01/28/2022   Procedure: ULTRASOUND GUIDANCE FOR VASCULAR ACCESS;  Surgeon: Burnell Blanks, MD;  Location: [place];  Service: Open Heart Surgery;  Laterality: Bilateral;    Social History:  Ambulatory  walker        reports that he quit smoking about 54 years ago. His smoking use included cigarettes. He has a 1.25 pack-year smoking history. He has never used smokeless tobacco. He reports that he does not drink alcohol and does not use drugs.    Family History:   Family History  Problem Relation Age of Onset   Cancer Mother    Stroke Father    ______________________________________________________________________________________________ Allergies: Allergies  Allergen Reactions   Benazepril Hcl Cough     Prior to Admission medications   Medication Sig Start Date End Date Taking? Authorizing Provider  acetaminophen (TYLENOL) 650 MG CR tablet Take 650 mg by mouth daily as needed for pain.   Yes [provider]  aspirin 81 MG chewable tablet Chew 1 tablet (81 mg total) by mouth daily. 03/21/22  Yes Patwardhan, Manish J, MD  pravastatin (PRAVACHOL) 20 MG tablet Take 20 mg by mouth daily. 12/31/20  Yes [provider]  spironolactone (ALDACTONE) 25 MG tablet Take 1 tablet (25 mg total) by mouth daily. 03/23/22 06/21/22 Yes Patwardhan, Manish J, MD  torsemide (DEMADEX) 20 MG tablet Take 20 mg by mouth 2 (two) times daily.   Yes [provider]   amLODipine (NORVASC) 5 MG tablet Take 1 tablet (5 mg total) by mouth daily. Patient not taking: Reported on 03/27/2022 01/29/22 01/29/23  Eileen Stanford, PA-C  amoxicillin (AMOXIL) 500 MG tablet Take 4 tablets (2,000 mg total) by mouth as directed. 1 hour prior to dental work including cleanings Patient not taking: Reported on 03/27/2022 03/21/22   Nigel Mormon, MD    ___________________________________________________________________________________________________ Physical Exam:    03/27/2022    6:15 PM 03/27/2022    6:00 PM 03/27/2022    5:45 PM  Vitals with BMI  Systolic [id number]  Diastolic [id number] 79 62  Pulse 83 50 87     1. General:  in No  Acute distress    Chronically ill   -appearing 2. Psychological: Alert and   Oriented 3. Head/ENT:   Dry Mucous Membranes                          Head Non traumatic, neck supple                          Poor Dentition 4. SKIN: decreased Skin turgor,  Skin clean Dry and intact no rash 5. Heart: Regular rate and rhythm  mild Murmur, no Rub or gallop 6. Lungs:   no wheezes or crackles   7. Abdomen: Soft,  non-tender, Non distended bowel sounds present 8. Lower extremities: no clubbing, cyanosis, no  edema 9. Neurologically strength 5 out of 5 in all 4 extremities cranial nerves II through XII intact 10. MSK: Normal range of motion    Chart has been reviewed  ______________________________________________________________________________________________  Assessment/Plan 88 y.o. male with medical history significant of  with hypertension, hyperlipidemia, s/p TAVR for severe AS   Admitted for   Stroke-like symptoms     Present on Admission:  Stroke-like symptoms  Essential hypertension  Hyperlipidemia  AKI (acute kidney injury) ([place])  Chronic diastolic CHF (congestive heart failure) (HCC)     Essential hypertension Chronic stable allow permissive hypertension for the past 48 hours  Hyperlipidemia Continue Pravachol 20  mg daily adjust as needed depending on lipid panel  S/P TAVR (transcatheter aortic valve replacement) Will need to continue on aspirin 81 mg a day for now  Stroke-like symptoms  - will admit based on TIA/CVA protocol,       Monitor on Tele       MRA/MRI await   Results    , Carotid Doppler ordered     Echo  Was done with in pst month         obtain cardiac enzymes,  ECG,   Lipid panel, TSH.  Order PT/OT evaluation.        keep nothing by mouth until passes swallow eval        Will make sure patient is on antiplatelet ASA 81  Plavix agent and statin        Allow permissive Hypertension keep BP <220/120        Neurology consulted Have seen pt in ER    AKI (acute kidney injury) (HCC) Hold Torsemide , given gently fluids Obtain urine electrolytes   Chronic diastolic CHF (congestive heart failure) (HCC) - currently appears to be slightly on the dry side, hold home diuretics for tonight and restart when appears euvolemic, carefuly follow fluid status and Cr    Other plan as per orders.  DVT prophylaxis:  SCD       Code Status:    Code Status: Prior FULL CODE    as per patient  family  I had personally discussed CODE STATUS with patient and family    Family Communication:   Family   at  Bedside  plan of care was discussed  with   Daughter, Wife,    Disposition Plan:     To home once workup is complete and patient is stable   Following barriers for discharge:                                                        Anemia  stable                                                      Will need consultants to evaluate patient prior to discharge                       Would benefit from PT/OT eval prior to DC  Ordered                   Swallow eval - SLP ordered                                        Consults called: Neurology is aware    Admission status:  ED Disposition     ED Disposition  Admit   Condition  --   Comment  Hospital Area: [facility] [[id number]]  Level of Care: Telemetry Medical [[id number]]  May admit patient to [facility] or [facility] if equivalent level of care is available:: No  Covid Evaluation: Asymptomatic - no recent exposure (last 10 days) testing not required  Diagnosis: Stroke-like symptoms [585277]  Admitting Physician: Therisa Doyne [[id number]]  Attending Physician: Therisa Doyne [[id number]]  Estimated length of stay: past midnight tomorrow  Certification:: I certify this patient will need inpatient services for at least 2 midnights            inpatient     I Expect 2 midnight stay secondary to severity of patient's current illness need for inpatient interventions justified by the following:  hemodynamic instability despite optimal treatment      and extensive comorbidities including:  CHF    That are currently affecting medical management.   I expect  patient to be hospitalized for 2 midnights requiring inpatient medical care.  Patient is at high risk for adverse outcome (such as loss of life or disability) if not treated.   Likely stroke   Need for stroke work up    Level of care     tele  For 12H 24H     medical floor       progressive tele indefinitely please discontinue once patient no longer qualifies COVID-19 Labs    Lab Results  Component Value Date   [place] NEGATIVE 01/24/2022     Toy Baker 03/27/2022, 8:23 PM    [facility]     after 2 AM please page floor coverage PA If 7AM-7PM, please contact the day team taking care of the patient using [email or web address]   Patient was evaluated in the context of the global COVID-19 pandemic, which necessitated consideration that the patient might be at risk for infection with the SARS-CoV-2 virus that causes COVID-19. Institutional protocols and algorithms that pertain to the evaluation of patients at risk for COVID-19 are in a state of rapid change based on information released by regulatory bodies including the  CDC and federal and state organizations. These policies and algorithms were followed during the patient's care.

## 2022-03-27 NOTE — Assessment & Plan Note (Signed)
Chronic stable allow permissive hypertension for the past 48 hours

## 2022-03-27 NOTE — Code Documentation (Incomplete)
Stroke Response Nurse Documentation Code Documentation  Wendy Mikles is a 88 y.o. male arriving to [facility]  via [facility] on 03/27/2022 with past medical hx of chronic diastolic heart failure, hyperlipidemia, hypertension, severe aortic stenosis, and spinal stenosis. On No antithrombotic. Code stroke was activated by EMS.   Patient from home where he was LKW at 1600. Wife called EMS when she noted he had right facial droop and slurred speech. He denies these symptoms.   Stroke team at the bedside on patient arrival. Labs drawn and patient cleared for CT by Dr. Jacqulyn Bath. Patient to CT with team. NIHSS 4, see documentation for details and code stroke times. Patient with right facial droop, bilateral leg weakness, and dysarthria  on exam. The following imaging was completed:  CT Head. Patient is not a candidate for IV Thrombolytic due to . Patient is not a candidate for IR due to no LVO.   Care Plan: monitor closely Q30 VS and NIHSS until out of window 2030. Notify stroke team if increase in NIHSS.   Bedside handoff with ED RN Leonard Downing Stroke Response RN

## 2022-03-27 NOTE — Assessment & Plan Note (Signed)
-   will admit based on TIA/CVA protocol,       Monitor on Tele       MRA/MRI await   Results    , Carotid Doppler ordered     Echo  Was done with in pst month         obtain cardiac enzymes,  ECG,   Lipid panel, TSH.        Order PT/OT evaluation.        keep nothing by mouth until passes swallow eval        Will make sure patient is on antiplatelet ASA 81  Plavix agent and statin        Allow permissive Hypertension keep BP <220/120        Neurology consulted Have seen pt in ER

## 2022-03-27 NOTE — ED Notes (Signed)
ED TO INPATIENT HANDOFF REPORT  ED Nurse Name and Phone #: Shirlee Limerick [phone]  S Name/Age/Gender Marcus Powers 88 y.o. male Room/Bed: TRAAC/TRAAC  Code Status   Code Status: Prior  Home/SNF/Other Home Patient oriented to: self, place, time, and situation Is this baseline? Yes   Triage Complete: Triage complete  Chief Complaint Stroke-like symptoms [R29.90]  Triage Note Pt bib gcems from home as code stroke. [place] 1600. Pt began to have issue with gait, dysarthria, and R sided facial droop. Denies thinners. Denies any recent falls. Bilateral 18g.    Allergies Allergies  Allergen Reactions   Benazepril Hcl Cough    Level of Care/Admitting Diagnosis ED Disposition     ED Disposition  Admit   Condition  --   Comment  Hospital Area: [facility] [[id number]]  Level of Care: Telemetry Medical [[id number]]  May admit patient to [facility] or [facility] if equivalent level of care is available:: No  Covid Evaluation: Asymptomatic - no recent exposure (last 10 days) testing not required  Diagnosis: Stroke-like symptoms [id number]  Admitting Physician: Toy Baker [[id number]]  Attending Physician: Toy Baker [[id number]]  Estimated length of stay: past midnight tomorrow  Certification:: I certify this patient will need inpatient services for at least 2 midnights          B Medical/Surgery History Past Medical History:  Diagnosis Date   Chronic diastolic heart failure ([place])    Hyperlipidemia    Hypertension    S/P TAVR (transcatheter aortic valve replacement) 01/28/2022   s/p TAVR with a 26 mm Edwards S3UR via the TF approach by Dr. Angelena Form & Dr. Cyndia Bent   Severe aortic stenosis    Spinal stenosis    Past Surgical History:  Procedure Laterality Date   BIOPSY BREAST     EYE SURGERY Bilateral    RIGHT HEART CATH AND CORONARY ANGIOGRAPHY N/A 12/13/2021   Procedure: RIGHT HEART CATH AND CORONARY ANGIOGRAPHY;  Surgeon: Nigel Mormon, MD;  Location: [facility];  Service: Cardiovascular;  Laterality: N/A;   TEE WITHOUT CARDIOVERSION N/A 01/28/2022   Procedure: TRANSESOPHAGEAL ECHOCARDIOGRAM (TEE);  Surgeon: Burnell Blanks, MD;  Location: [facility];  Service: Open Heart Surgery;  Laterality: N/A;   TONSILLECTOMY     TRANSCATHETER AORTIC VALVE REPLACEMENT, TRANSFEMORAL N/A 01/28/2022   Procedure: Transcatheter Aortic Valve Replacement, Transfemoral;  Surgeon: Burnell Blanks, MD;  Location: [facility];  Service: Open Heart Surgery;  Laterality: N/A;   ULTRASOUND GUIDANCE FOR VASCULAR ACCESS Bilateral 01/28/2022   Procedure: ULTRASOUND GUIDANCE FOR VASCULAR ACCESS;  Surgeon: Burnell Blanks, MD;  Location: [facility];  Service: Open Heart Surgery;  Laterality: Bilateral;     A IV Location/Drains/Wounds Patient Lines/Drains/Airways Status     Active Line/Drains/Airways     Name Placement date Placement time Site Days   Incision (Closed) 01/28/22 Groin Left 01/28/22  0940  -- 58   Incision (Closed) 01/28/22 Groin Right 01/28/22  0940  -- 58            Intake/Output Last 24 hours No intake or output data in the 24 hours ending 03/27/22 1934  Labs/Imaging Results for orders placed or performed during the hospital encounter of 03/27/22 (from the past 48 hour(s))  CBG monitoring, ED     Status: Abnormal   Collection Time: 03/27/22  4:56 PM  Result Value Ref Range   Glucose-Capillary 133 (H) 70 - 99 mg/dL    Comment: Glucose reference range applies only to samples taken  after fasting for at least 8 hours.  Ethanol     Status: None   Collection Time: 03/27/22  4:57 PM  Result Value Ref Range   Alcohol, Ethyl (B) <10 <10 mg/dL    Comment: (NOTE) Lowest detectable limit for serum alcohol is 10 mg/dL.  For medical purposes only. Performed at [facility], [place] [address], [place], [place] [address]   Protime-INR     Status: None   Collection Time: 03/27/22  4:57 PM  Result Value Ref Range   Prothrombin Time  13.0 11.4 - 15.2 seconds   INR 1.0 0.8 - 1.2    Comment: (NOTE) INR goal varies based on device and disease states. Performed at [facility], [place] [address], [place], [place] [address]   APTT     Status: Abnormal   Collection Time: 03/27/22  4:57 PM  Result Value Ref Range   aPTT 23 (L) 24 - 36 seconds    Comment: Performed at [facility] [address], [place], [place] [address]  CBC     Status: Abnormal   Collection Time: 03/27/22  4:57 PM  Result Value Ref Range   WBC 8.3 4.0 - 10.5 K/uL   RBC 4.07 (L) 4.22 - 5.81 MIL/uL   Hemoglobin 12.9 (L) 13.0 - 17.0 g/dL   HCT 39.8 39.0 - 52.0 %   MCV 97.8 80.0 - 100.0 fL   MCH 31.7 26.0 - 34.0 pg   MCHC 32.4 30.0 - 36.0 g/dL   RDW 14.7 11.5 - 15.5 %   Platelets 173 150 - 400 K/uL   nRBC 0.0 0.0 - 0.2 %    Comment: Performed at [facility], [place] [address] [place], [place] [address]  Differential     Status: None   Collection Time: 03/27/22  4:57 PM  Result Value Ref Range   Neutrophils Relative % 64 %   Neutro Abs 5.4 1.7 - 7.7 K/uL   Lymphocytes Relative 22 %   Lymphs Abs 1.8 0.7 - 4.0 K/uL   Monocytes Relative 12 %   Monocytes Absolute 1.0 0.1 - 1.0 K/uL   Eosinophils Relative 1 %   Eosinophils Absolute 0.1 0.0 - 0.5 K/uL   Basophils Relative 1 %   Basophils Absolute 0.1 0.0 - 0.1 K/uL   Immature Granulocytes 0 %   Abs Immature Granulocytes 0.02 0.00 - 0.07 K/uL    Comment: Performed at [facility] [address] [place], [place] [address]  Comprehensive metabolic panel     Status: Abnormal   Collection Time: 03/27/22  4:57 PM  Result Value Ref Range   Sodium 142 135 - 145 mmol/L   Potassium 4.2 3.5 - 5.1 mmol/L   Chloride 104 98 - 111 mmol/L   CO2 25 22 - 32 mmol/L   Glucose, Bld 121 (H) 70 - 99 mg/dL    Comment: Glucose reference range applies only to samples taken after fasting for at least 8 hours.   BUN 20 8 - 23 mg/dL   Creatinine, Ser 1.69 (H) 0.61 - 1.24 mg/dL   Calcium 9.6 8.9 -  10.3 mg/dL   Total Protein 7.2 6.5 - 8.1 g/dL   Albumin 4.2 3.5 - 5.0 g/dL   AST 26 15 - 41 U/L   ALT 13 0 - 44 U/L   Alkaline Phosphatase 71 38 - 126 U/L   Total Bilirubin 1.3 (H) 0.3 - 1.2 mg/dL   GFR, Estimated 38 (  L) >60 mL/min    Comment: (NOTE) Calculated using the CKD-EPI Creatinine Equation (2021)    Anion gap 13 5 - 15    Comment: Performed at [facility], [address], [place], [place] [address]  I-stat chem 8, ED     Status: Abnormal   Collection Time: 03/27/22  5:04 PM  Result Value Ref Range   Sodium 141 135 - 145 mmol/L   Potassium 3.9 3.5 - 5.1 mmol/L   Chloride 104 98 - 111 mmol/L   BUN 23 8 - 23 mg/dL   Creatinine, Ser 6.06 (H) 0.61 - 1.24 mg/dL   Glucose, Bld 301 (H) 70 - 99 mg/dL    Comment: Glucose reference range applies only to samples taken after fasting for at least 8 hours.   Calcium, Ion 1.12 (L) 1.15 - 1.40 mmol/L   TCO2 26 22 - 32 mmol/L   Hemoglobin 12.9 (L) 13.0 - 17.0 g/dL   HCT 60.1 (L) 09.3 - 23.5 %  Urine rapid drug screen (hosp performed)     Status: None   Collection Time: 03/27/22  6:25 PM  Result Value Ref Range   Opiates NONE DETECTED NONE DETECTED   Cocaine NONE DETECTED NONE DETECTED   Benzodiazepines NONE DETECTED NONE DETECTED   Amphetamines NONE DETECTED NONE DETECTED   Tetrahydrocannabinol NONE DETECTED NONE DETECTED   Barbiturates NONE DETECTED NONE DETECTED    Comment: (NOTE) DRUG SCREEN FOR MEDICAL PURPOSES ONLY.  IF CONFIRMATION IS NEEDED FOR ANY PURPOSE, NOTIFY LAB WITHIN 5 DAYS.  LOWEST DETECTABLE LIMITS FOR URINE DRUG SCREEN Drug Class                     Cutoff (ng/mL) Amphetamine and metabolites    1000 Barbiturate and metabolites    200 Benzodiazepine                 200 Tricyclics and metabolites     300 Opiates and metabolites        300 Cocaine and metabolites        300 THC                            50 Performed at [facility], [address], [place], [place] [address]   Urinalysis,  Routine w reflex microscopic Urine, Clean Catch     Status: Abnormal   Collection Time: 03/27/22  6:25 PM  Result Value Ref Range   Color, Urine YELLOW YELLOW   APPearance CLEAR CLEAR   Specific Gravity, Urine 1.009 1.005 - 1.030   pH 5.0 5.0 - 8.0   Glucose, UA NEGATIVE NEGATIVE mg/dL   Hgb urine dipstick NEGATIVE NEGATIVE   Bilirubin Urine NEGATIVE NEGATIVE   Ketones, ur NEGATIVE NEGATIVE mg/dL   Protein, ur NEGATIVE NEGATIVE mg/dL   Nitrite NEGATIVE NEGATIVE   Leukocytes,Ua SMALL (A) NEGATIVE   RBC / HPF 0-5 0 - 5 RBC/hpf   WBC, UA 11-20 0 - 5 WBC/hpf   Bacteria, UA FEW (A) NONE SEEN   Squamous Epithelial / LPF 0-5 0 - 5   Mucus PRESENT    Hyaline Casts, UA PRESENT     Comment: Performed at [facility], [address], [place], [place] [address]   DG Chest Portable 1 View  Result Date: 03/27/2022 CLINICAL DATA:  CVA workup. EXAM: PORTABLE CHEST 1 VIEW COMPARISON:  Chest radiograph dated 01/24/2022. FINDINGS: No focal consolidation, pleural  effusion, pneumothorax. The cardiac silhouette is within limits. Aortic valve repair. Atherosclerotic calcification of the aorta. No acute osseous pathology. IMPRESSION: No active disease. Electronically Signed   By: Elgie Collard M.D.   On: 03/27/2022 19:17   CT HEAD CODE STROKE WO CONTRAST  Result Date: 03/27/2022 CLINICAL DATA:  Code stroke. Provided history: Neuro deficit, acute, stroke suspected. Additional history provided: Right facial droop, gait change, dysarthria. EXAM: CT HEAD WITHOUT CONTRAST TECHNIQUE: Contiguous axial images were obtained from the base of the skull through the vertex without intravenous contrast. RADIATION DOSE REDUCTION: This exam was performed according to the departmental dose-optimization program which includes automated exposure control, adjustment of the mA and/or kV according to patient size and/or use of iterative reconstruction technique. COMPARISON:  No pertinent prior exams available for comparison.  FINDINGS: Mildly motion degraded exam. Brain: Moderate generalized cerebral atrophy. Moderate patchy and ill-defined hypoattenuation within the cerebral white matter, nonspecific but compatible with chronic small vessel ischemic disease. Small chronic cortically based infarct within the right parietal lobe. There is no acute intracranial hemorrhage. No acute demarcated cortical infarct. No extra-axial fluid collection. No evidence of an intracranial mass. No midline shift. Vascular: No hyperdense vessel.  Atherosclerotic calcifications. Skull: No fracture or aggressive osseous lesion. Sinuses/Orbits: No mass or acute finding within the imaged orbits. Mild mucosal thickening within the left frontal, bilateral ethmoid, bilateral sphenoid and right maxillary sinuses at the imaged levels. ASPECTS ([place] Stroke Program Early CT Score) - Ganglionic level infarction (caudate, lentiform nuclei, internal capsule, insula, M1-M3 cortex): 7 - Supraganglionic infarction (M4-M6 cortex): 3 Total score (0-10 with 10 being normal): 10 These results were communicated to Dr. Selina Cooley At 5:15 pmon 6/22/2023by text page via the [facility] messaging system. IMPRESSION: No evidence of acute intracranial abnormality. Small chronic cortically-based infarct within the right parietal lobe. Moderate chronic small vessel ischemic changes within the cerebral white matter. Moderate generalized cerebral atrophy. Mild paranasal sinus mucosal thickening. Electronically Signed   By: Jackey Loge D.O.   On: 03/27/2022 17:16    Pending Labs Unresulted Labs (From admission, onward)     Start     Ordered   03/28/22 0500  Lipid panel  Tomorrow morning,   R        03/27/22 1901   03/28/22 0500  Hemoglobin A1c  Tomorrow morning,   R        03/27/22 1901   03/27/22 1922  Magnesium  Add-on,   AD        03/27/22 1921   03/27/22 1922  Phosphorus  Add-on,   AD        03/27/22 1921   03/27/22 1922  TSH  Add-on,   AD        03/27/22 1921   03/27/22 1704   Resp Panel by RT-PCR (Flu A&B, Covid) Anterior Nasal Swab  (Asymptomatic - Covid)  Once,   URGENT        03/27/22 1703            Vitals/Pain Today's Vitals   03/27/22 1730 03/27/22 1745 03/27/22 1800 03/27/22 1815  BP: (!) 126/104 137/62 107/79 (!) 121/106  Pulse: 99 87 (!) 50 83  Resp: 17 18 15  (!) 22  Temp:      TempSrc:      SpO2: 100% 98% 98% 100%  Weight:        Isolation Precautions No active isolations  Medications Medications - No data to display  Mobility walks with device High fall risk  Focused Assessments Neuro Assessment Handoff:    NIH Stroke Scale ( + Modified Stroke Scale Criteria)  Interval: Initial Level of Consciousness (1a.)   : Alert, keenly responsive LOC Questions (1b. )   +: Answers both questions correctly LOC Commands (1c. )   + : Performs both tasks correctly Best Gaze (2. )  +: Normal Visual (3. )  +: No visual loss Facial Palsy (4. )    : Minor paralysis Motor Arm, Left (5a. )   +: No drift Motor Arm, Right (5b. )   +: No drift Motor Leg, Left (6a. )   +: Drift Motor Leg, Right (6b. )   +: Drift Limb Ataxia (7. ): Absent Sensory (8. )   +: Normal, no sensory loss Best Language (9. )   +: No aphasia Dysarthria (10. ): Mild-to-moderate dysarthria, patient slurs at least some words and, at worst, can be understood with some difficulty Extinction/Inattention (11.)   +: No Abnormality Modified SS Total  +: 2 Complete NIHSS TOTAL: 4 Last date known well: 03/27/22 Last time known well: 1600 Neuro Assessment:   Neuro Checks:   Initial (03/27/22 1700)  Last Documented NIHSS Modified Score: 2 (03/27/22 1900) Has TPA been given? No If patient is a Neuro Trauma and patient is going to OR before floor call report to [place] nurse: [phone] or [phone]   R Recommendations: See Admitting Provider Note  Report given to:   Additional Notes:

## 2022-03-27 NOTE — ED Triage Notes (Signed)
Pt bib gcems from home as code stroke. [facility] 1600. Pt began to have issue with gait, dysarthria, and R sided facial droop. Denies thinners. Denies any recent falls. Bilateral 18g.

## 2022-03-27 NOTE — Assessment & Plan Note (Signed)
-   currently appears to be slightly on the dry side, hold home diuretics for tonight and restart when appears euvolemic, carefuly follow fluid status and Cr  

## 2022-03-27 NOTE — Consult Note (Signed)
NEUROLOGY CONSULTATION NOTE   Date of service: March 27, 2022 Patient Name: Marcus Powers MRN:  [id number] DOB:  Oct 20, 1927 Reason for consult: "dysarthria, right sided facial droop and gait disturbance" Requesting physician: "Dr. Jacqulyn Bath" _ _ _   _ __   _ __ _ _  __ __   _ __   __ _  History of Present Illness   Marcus Powers is a 88 y.o. male with PMH significant for  has a past medical history of Chronic diastolic heart failure (HCC), Hyperlipidemia, Hypertension, S/P TAVR (transcatheter aortic valve replacement) (01/28/2022), Severe aortic stenosis, and Spinal stenosis. Patient is supposed to be taking aspirin daily but only started doing this last week. Patient presents with  dysarthria, right sided facial droop and gait disturbance.  He was found to be speaking abnormally by his wife and daughter this afternoon with a last known well time of 1600.  EMS was called, and patient was brought to the hospital.  Upon arrival, he was noted to have a right sided facial droop, and weakness of bilateral legs.  Dysarthria has resolved, and patient and family state that his speech is now back to normal.  Leg weakness is likely influenced by patient effort.   TNK was not given due to deficits resolving and now being too mild to treat.   ROS   Per HPI: all other systems reviewed and are negative  Past History   I have reviewed the following:  Past Medical History:  Diagnosis Date   Chronic diastolic heart failure (HCC)    Hyperlipidemia    Hypertension    S/P TAVR (transcatheter aortic valve replacement) 01/28/2022   s/p TAVR with a 26 mm Edwards S3UR via the TF approach by Dr. Clifton James & Dr. Laneta Simmers   Severe aortic stenosis    Spinal stenosis    Past Surgical History:  Procedure Laterality Date   BIOPSY BREAST     EYE SURGERY Bilateral    RIGHT HEART CATH AND CORONARY ANGIOGRAPHY N/A 12/13/2021   Procedure: RIGHT HEART CATH AND CORONARY ANGIOGRAPHY;  Surgeon: Elder Negus, MD;  Location:  [facility];  Service: Cardiovascular;  Laterality: N/A;   TEE WITHOUT CARDIOVERSION N/A 01/28/2022   Procedure: TRANSESOPHAGEAL ECHOCARDIOGRAM (TEE);  Surgeon: Kathleene Hazel, MD;  Location: [facility] OR;  Service: Open Heart Surgery;  Laterality: N/A;   TONSILLECTOMY     TRANSCATHETER AORTIC VALVE REPLACEMENT, TRANSFEMORAL N/A 01/28/2022   Procedure: Transcatheter Aortic Valve Replacement, Transfemoral;  Surgeon: Kathleene Hazel, MD;  Location: [facility] OR;  Service: Open Heart Surgery;  Laterality: N/A;   ULTRASOUND GUIDANCE FOR VASCULAR ACCESS Bilateral 01/28/2022   Procedure: ULTRASOUND GUIDANCE FOR VASCULAR ACCESS;  Surgeon: Kathleene Hazel, MD;  Location: [facility] OR;  Service: Open Heart Surgery;  Laterality: Bilateral;   Family History  Problem Relation Age of Onset   Cancer Mother    Stroke Father    Social History   Socioeconomic History   Marital status: Married    Spouse name: Not on file   Number of children: Not on file   Years of education: Not on file   Highest education level: Not on file  Occupational History   Not on file  Tobacco Use   Smoking status: Former    Packs/day: 0.25    Years: 5.00    Total pack years: 1.25    Types: Cigarettes    Quit date: 51    Years since quitting: 54.5   Smokeless tobacco: Never  Vaping Use   Vaping Use: Never used  Substance and Sexual Activity   Alcohol use: No   Drug use: No   Sexual activity: Not on file  Other Topics Concern   Not on file  Social History Narrative   Not on file   Social Determinants of Health   Financial Resource Strain: Not on file  Food Insecurity: Not on file  Transportation Needs: Not on file  Physical Activity: Not on file  Stress: Not on file  Social Connections: Not on file   Allergies  Allergen Reactions   Benazepril Hcl Cough    Medications   (Not in a hospital admission)    No current facility-administered medications for this encounter.  Current Outpatient  Medications:    acetaminophen (TYLENOL) 650 MG CR tablet, Take 650 mg by mouth daily., Disp: , Rfl:    amLODipine (NORVASC) 5 MG tablet, Take 1 tablet (5 mg total) by mouth daily., Disp: 30 tablet, Rfl: 11   amoxicillin (AMOXIL) 500 MG tablet, Take 4 tablets (2,000 mg total) by mouth as directed. 1 hour prior to dental work including cleanings, Disp: 12 tablet, Rfl: 12   aspirin 81 MG chewable tablet, Chew 1 tablet (81 mg total) by mouth daily., Disp: 90 tablet, Rfl: 3   pravastatin (PRAVACHOL) 20 MG tablet, Take 20 mg by mouth daily., Disp: , Rfl:    spironolactone (ALDACTONE) 25 MG tablet, Take 1 tablet (25 mg total) by mouth daily., Disp: 30 tablet, Rfl: 2  Vitals   Vitals:   03/27/22 1730 03/27/22 1745 03/27/22 1800 03/27/22 1815  BP: (!) 126/104 137/62 107/79 (!) 121/106  Pulse: 99 87 (!) 50 83  Resp: 17 18 15  (!) 22  Temp:      TempSrc:      SpO2: 100% 98% 98% 100%  Weight:         Body mass index is 24.13 kg/m.  Physical Exam   Physical Exam Gen: A&O x2, NAD HEENT: Atraumatic, normocephalic;mucous membranes moist; oropharynx clear, tongue without atrophy or fasciculations. Neck: Supple, trachea midline. Extrem: Nml bulk; no cyanosis, clubbing, or edema.  Neuro: MS: A&O x2. Follows multi-step commands.  Speech: fluid, nondysarthric, able to name and repeat CN:    I: Deferred   II,III: PERRLA, VFF    III,IV,VI: EOMI w/o nystagmus, no ptosis   V: Sensation intact from V1 to V3 to LT   VII: Eyelid closure was full.  Slight right sided facial droop   VIII: Hearing intact to voice   IX,X: Voice normal, palate elevates symmetrically    XI: SCM/trap 5/5 bilat   XII: Tongue protrudes midline, no atrophy or fasciculations   Motor:   Normal bulk.  No tremor, rigidity or bradykinesia. No pronator drift.   Sensory: Intact to light touch throughout. Symmetric. Propioception intact bilat.  No double-simultaneous extinction.  Coordination:  Finger-to-nose motions were  intact. Gait: deferred  NIHSS  1a Level of Conscious.: 0 1b LOC Questions: 0 1c LOC Commands: 0 2 Best Gaze: 0 3 Visual: 0 4 Facial Palsy: 1 5a Motor Arm - left: 0 5b Motor Arm - Right: 0 6a Motor Leg - Left: 2 6b Motor Leg - Right: 2 7 Limb Ataxia: 0 8 Sensory: 0 9 Best Language: 0 10 Dysarthria: 0 11 Extinct. and Inatten.: 0  TOTAL: 5   Premorbid mRS = 2   Labs   CBC:  Recent Labs  Lab 03/27/22 1657 03/27/22 1704  WBC 8.3  --   NEUTROABS  5.4  --   HGB 12.9* 12.9*  HCT 39.8 38.0*  MCV 97.8  --   PLT 173  --     Basic Metabolic Panel:  Lab Results  Component Value Date   NA 141 03/27/2022   K 3.9 03/27/2022   CO2 25 03/27/2022   GLUCOSE 119 (H) 03/27/2022   BUN 23 03/27/2022   CREATININE 1.80 (H) 03/27/2022   CALCIUM 9.6 03/27/2022   GFRNONAA 38 (L) 03/27/2022   GFRAA >60 05/13/2017   Lipid Panel: No results found for: "LDLCALC" HgbA1c: No results found for: "HGBA1C" Urine Drug Screen: No results found for: "LABOPIA", "COCAINSCRNUR", "LABBENZ", "AMPHETMU", "THCU", "LABBARB"  Alcohol Level     Component Value Date/Time   ETH <10 03/27/2022 1657    CT Head without contrast: No acute intracranial abnormality, chronic small vessel ischemic changes, moderate atrophy, ASPECTS 10  MRI/MRA Brain pending  Impression   88 year old patient presents with sudden onset dysarthria, right facial droop and gait disturbance.  LKW 1600, but deficits on exam are too mild to treat, so TNK was not given.  Dysarthria has resolved with patient and family stating that his speech is back to normal.  Patient will be monitored closely for worsening of symptoms until he is out of the TNK window and will be admitted for stroke/TIA workup with brain MRI, MRA, echocardiogram, lipid panel and A1C.  Recommendations   - Admit for stroke workup - Permissive HTN x48 hrs from sx onset or until stroke ruled out by MRI goal BP <220/110. PRN labetalol or hydralazine if BP above  these parameters. Avoid oral antihypertensives. - MRI brain wo contrast - CTA/MRA H&N - TTE w/ bubble - Check A1c and LDL + add statin per guidelines - aspirin and clopidogrel - q4 hr neuro checks - STAT head CT for any change in neuro exam - Tele - PT/OT/SLP - Stroke education - Amb referral to neurology upon discharge    Stroke team will assume care tmrw ______________________________________________________________________   Thank you for the opportunity to take part in the care of this patient. If you have any further questions, please contact the neurology consultation attending.  Signed,  Cortney E Ernestina Columbia , MSN, AGACNP-BC [facility] See Amion for schedule and pager information 03/27/2022 7:12 PM    If 7pm- 7am, please page neurology on call as listed in AMION.    Attending Neurohospitalist Addendum Patient seen and examined with APP/Resident. Agree with the history and physical as documented above. Agree with the plan as documented, which I helped formulate. I have edited the note above to reflect my full findings and recommendations. I have independently reviewed the chart, obtained history, review of systems and examined the patient.I have personally reviewed pertinent head/neck/spine imaging (CT/MRI). Please feel free to call with any questions.  -- Bing Neighbors, MD [facility] [phone]  If 7pm- 7am, please page neurology on call as listed in AMION.

## 2022-03-28 ENCOUNTER — Inpatient Hospital Stay (HOSPITAL_COMMUNITY): Payer: Medicare Other

## 2022-03-28 ENCOUNTER — Other Ambulatory Visit (HOSPITAL_COMMUNITY): Payer: Self-pay

## 2022-03-28 DIAGNOSIS — N1832 Chronic kidney disease, stage 3b: Secondary | ICD-10-CM | POA: Diagnosis not present

## 2022-03-28 DIAGNOSIS — G459 Transient cerebral ischemic attack, unspecified: Secondary | ICD-10-CM | POA: Diagnosis not present

## 2022-03-28 DIAGNOSIS — Z87891 Personal history of nicotine dependence: Secondary | ICD-10-CM | POA: Diagnosis not present

## 2022-03-28 DIAGNOSIS — I130 Hypertensive heart and chronic kidney disease with heart failure and stage 1 through stage 4 chronic kidney disease, or unspecified chronic kidney disease: Secondary | ICD-10-CM | POA: Diagnosis not present

## 2022-03-28 DIAGNOSIS — Z20822 Contact with and (suspected) exposure to covid-19: Secondary | ICD-10-CM | POA: Diagnosis not present

## 2022-03-28 DIAGNOSIS — R2990 Unspecified symptoms and signs involving the nervous system: Secondary | ICD-10-CM | POA: Diagnosis not present

## 2022-03-28 DIAGNOSIS — Z79899 Other long term (current) drug therapy: Secondary | ICD-10-CM | POA: Diagnosis not present

## 2022-03-28 DIAGNOSIS — I639 Cerebral infarction, unspecified: Secondary | ICD-10-CM

## 2022-03-28 DIAGNOSIS — Z952 Presence of prosthetic heart valve: Secondary | ICD-10-CM | POA: Diagnosis not present

## 2022-03-28 DIAGNOSIS — Z7982 Long term (current) use of aspirin: Secondary | ICD-10-CM | POA: Diagnosis not present

## 2022-03-28 DIAGNOSIS — I5032 Chronic diastolic (congestive) heart failure: Secondary | ICD-10-CM | POA: Diagnosis not present

## 2022-03-28 DIAGNOSIS — N179 Acute kidney failure, unspecified: Secondary | ICD-10-CM | POA: Diagnosis not present

## 2022-03-28 LAB — HEMOGLOBIN A1C
Hgb A1c MFr Bld: 6 % — ABNORMAL HIGH (ref 4.8–5.6)
Mean Plasma Glucose: 125.5 mg/dL

## 2022-03-28 LAB — COMPREHENSIVE METABOLIC PANEL
ALT: 14 U/L (ref 0–44)
AST: 20 U/L (ref 15–41)
Albumin: 3.6 g/dL (ref 3.5–5.0)
Alkaline Phosphatase: 60 U/L (ref 38–126)
Anion gap: 10 (ref 5–15)
BUN: 24 mg/dL — ABNORMAL HIGH (ref 8–23)
CO2: 26 mmol/L (ref 22–32)
Calcium: 8.8 mg/dL — ABNORMAL LOW (ref 8.9–10.3)
Chloride: 104 mmol/L (ref 98–111)
Creatinine, Ser: 1.4 mg/dL — ABNORMAL HIGH (ref 0.61–1.24)
GFR, Estimated: 47 mL/min — ABNORMAL LOW (ref >60)
Glucose, Bld: 108 mg/dL — ABNORMAL HIGH (ref 70–99)
Potassium: 3.5 mmol/L (ref 3.5–5.1)
Sodium: 140 mmol/L (ref 135–145)
Total Bilirubin: 1 mg/dL (ref 0.3–1.2)
Total Protein: 6.5 g/dL (ref 6.5–8.1)

## 2022-03-28 LAB — LIPID PANEL
Cholesterol: 184 mg/dL (ref 0–200)
HDL: 71 mg/dL (ref >40)
LDL Cholesterol: 94 mg/dL (ref 0–99)
Total CHOL/HDL Ratio: 2.6 RATIO
Triglycerides: 95 mg/dL (ref <150)
VLDL: 19 mg/dL (ref 0–40)

## 2022-03-28 LAB — BASIC METABOLIC PANEL
Anion gap: 10 (ref 5–15)
BUN: 23 mg/dL (ref 8–23)
CO2: 23 mmol/L (ref 22–32)
Calcium: 9 mg/dL (ref 8.9–10.3)
Chloride: 106 mmol/L (ref 98–111)
Creatinine, Ser: 1.29 mg/dL — ABNORMAL HIGH (ref 0.61–1.24)
GFR, Estimated: 52 mL/min — ABNORMAL LOW (ref >60)
Glucose, Bld: 141 mg/dL — ABNORMAL HIGH (ref 70–99)
Potassium: 3.7 mmol/L (ref 3.5–5.1)
Sodium: 139 mmol/L (ref 135–145)

## 2022-03-28 LAB — MAGNESIUM: Magnesium: 2 mg/dL (ref 1.7–2.4)

## 2022-03-28 LAB — PHOSPHORUS: Phosphorus: 4.8 mg/dL — ABNORMAL HIGH (ref 2.5–4.6)

## 2022-03-28 MED ORDER — ROSUVASTATIN CALCIUM 20 MG PO TABS: 40.0000 mg | ORAL_TABLET | Freq: Every day | ORAL | Status: DC

## 2022-03-28 MED ORDER — LORAZEPAM 2 MG/ML IJ SOLN
1.0000 mg | Freq: Once | INTRAMUSCULAR | Status: AC
  Administered 2022-03-28: 1 mg via INTRAMUSCULAR
  Filled 2022-03-28: qty 1

## 2022-03-28 MED ORDER — POTASSIUM CHLORIDE 2 MEQ/ML IV SOLN
INTRAVENOUS | Status: AC
  Filled 2022-03-28 (×2): qty 1000

## 2022-03-28 MED ORDER — EZETIMIBE 10 MG PO TABS
10.0000 mg | ORAL_TABLET | Freq: Every day | ORAL | Status: DC
  Administered 2022-03-28 – 2022-03-29 (×2): 10 mg via ORAL
  Filled 2022-03-28 (×2): qty 1

## 2022-03-28 MED ORDER — CLOPIDOGREL BISULFATE 75 MG PO TABS
75.0000 mg | ORAL_TABLET | Freq: Every day | ORAL | 0 refills | Status: DC
  Filled 2022-03-28: qty 20, 20d supply, fill #0

## 2022-03-28 MED ORDER — ACETAMINOPHEN 160 MG/5ML PO SOLN: 650.0000 mg | ORAL | Status: DC | PRN

## 2022-03-28 MED ORDER — SENNOSIDES-DOCUSATE SODIUM 8.6-50 MG PO TABS: 1.0000 | ORAL_TABLET | Freq: Every evening | ORAL | Status: DC | PRN

## 2022-03-28 MED ORDER — CARVEDILOL 3.125 MG PO TABS
3.1250 mg | ORAL_TABLET | Freq: Two times a day (BID) | ORAL | 0 refills | Status: DC
  Filled 2022-03-28: qty 60, 30d supply, fill #0

## 2022-03-28 MED ORDER — CARVEDILOL 3.125 MG PO TABS
3.1250 mg | ORAL_TABLET | Freq: Two times a day (BID) | ORAL | Status: DC
  Administered 2022-03-28 – 2022-03-29 (×3): 3.125 mg via ORAL
  Filled 2022-03-28 (×3): qty 1

## 2022-03-28 MED ORDER — EZETIMIBE 10 MG PO TABS
10.0000 mg | ORAL_TABLET | Freq: Every day | ORAL | 0 refills | Status: DC
  Filled 2022-03-28: qty 30, 30d supply, fill #0

## 2022-03-28 MED ORDER — HEPARIN SODIUM (PORCINE) 5000 UNIT/ML IJ SOLN
5000.0000 [IU] | Freq: Three times a day (TID) | INTRAMUSCULAR | Status: DC
  Administered 2022-03-28 – 2022-03-29 (×4): 5000 [IU] via SUBCUTANEOUS
  Filled 2022-03-28 (×4): qty 1

## 2022-03-28 MED ORDER — LORAZEPAM 2 MG/ML IJ SOLN
1.0000 mg | Freq: Once | INTRAMUSCULAR | Status: AC
  Administered 2022-03-28: 1 mg via INTRAVENOUS
  Filled 2022-03-28: qty 1

## 2022-03-28 MED ORDER — ACETAMINOPHEN 325 MG PO TABS: 650.0000 mg | ORAL_TABLET | ORAL | Status: DC | PRN

## 2022-03-28 MED ORDER — ASPIRIN 81 MG PO CHEW
81.0000 mg | CHEWABLE_TABLET | Freq: Every day | ORAL | Status: DC
  Administered 2022-03-28 – 2022-03-29 (×2): 81 mg via ORAL
  Filled 2022-03-28 (×2): qty 1

## 2022-03-28 MED ORDER — ACETAMINOPHEN 650 MG RE SUPP: 650.0000 mg | RECTAL | Status: DC | PRN

## 2022-03-28 MED ORDER — ROSUVASTATIN CALCIUM 20 MG PO TABS
20.0000 mg | ORAL_TABLET | Freq: Every day | ORAL | 0 refills | Status: AC
  Filled 2022-03-28: qty 30, 30d supply, fill #0

## 2022-03-28 MED ORDER — ROSUVASTATIN CALCIUM 20 MG PO TABS
20.0000 mg | ORAL_TABLET | Freq: Every day | ORAL | Status: DC
  Administered 2022-03-28 – 2022-03-29 (×2): 20 mg via ORAL
  Filled 2022-03-28 (×2): qty 1

## 2022-03-28 MED ORDER — PRAVASTATIN SODIUM 10 MG PO TABS: 20.0000 mg | ORAL_TABLET | Freq: Every day | ORAL | Status: DC

## 2022-03-28 MED ORDER — STROKE: EARLY STAGES OF RECOVERY BOOK
Freq: Once | Status: AC
  Filled 2022-03-28: qty 1

## 2022-03-28 NOTE — Progress Notes (Signed)
  X-cover Note: Received call from bedside RN. Pt agitated. "patient is confused, agitated, and combative, taking out all his lines and securement devices. will not allow mitten in place, restraints will get him more agitated"  Ordered 1 mg IV ativan.   Carollee Herter, DO [facility]

## 2022-03-28 NOTE — Evaluation (Signed)
Occupational Therapy Evaluation Patient Details Name: Marcus Powers MRN: [id number] DOB: 11-18-1927 Today's Date: 03/28/2022   History of Present Illness Pt is a 88 y/o male admitted secondary to slurred speech and R facial droop. MRI negative. PMH includes HTN, AKI, s/p TAVR, CHF.   Clinical Impression   Pt admitted for concerns listed above. PTA pt reported that he was independent with all ADL's and IADL's, including driving. At this time, pt presents with weakness, poor balance, and cognitive deficits. Pt is unable to recall the month or year this session. He can follow 1 step commands with increased time and requires min-mod A for all functional mobility and ADL performance. OT provided education that pt should refrain from driving, until cleared by an MD. Recommending HHOT to maximize independence and safety at home. OT will follow acutely.       Recommendations for follow up therapy are one component of a multi-disciplinary discharge planning process, led by the attending physician.  Recommendations may be updated based on patient status, additional functional criteria and insurance authorization.   Follow Up Recommendations  Home health OT    Assistance Recommended at Discharge Frequent or constant Supervision/Assistance  Patient can return home with the following A little help with walking and/or transfers;A lot of help with bathing/dressing/bathroom;Assistance with cooking/housework;Direct supervision/assist for medications management;Direct supervision/assist for financial management;Assist for transportation;Help with stairs or ramp for entrance    Functional Status Assessment  Patient has had a recent decline in their functional status and demonstrates the ability to make significant improvements in function in a reasonable and predictable amount of time.  Equipment Recommendations  Tub/shower seat;BSC/3in1    Recommendations for Other Services       Precautions /  Restrictions Precautions Precautions: Fall Restrictions Weight Bearing Restrictions: No      Mobility Bed Mobility Overal bed mobility: Needs Assistance Bed Mobility: Supine to Sit     Supine to sit: Min assist     General bed mobility comments: Assist for trunk elevation to come to sitting.    Transfers Overall transfer level: Needs assistance Equipment used: Rolling walker (2 wheels) Transfers: Sit to/from Stand Sit to Stand: Min assist           General transfer comment: Assist for lift assist and steadying. Despite cues, pt reaching for front of walker instead of handles for support      Balance Overall balance assessment: Needs assistance Sitting-balance support: No upper extremity supported, Feet supported Sitting balance-Leahy Scale: Fair   Postural control: Right lateral lean Standing balance support: Bilateral upper extremity supported Standing balance-Leahy Scale: Poor Standing balance comment: Reliant on BUE support                           ADL either performed or assessed with clinical judgement   ADL Overall ADL's : Needs assistance/impaired Eating/Feeding: Set up;Sitting   Grooming: Set up;Sitting   Upper Body Bathing: Minimal assistance;Sitting   Lower Body Bathing: Moderate assistance;Sitting/lateral leans;Sit to/from stand   Upper Body Dressing : Minimal assistance;Sitting   Lower Body Dressing: Moderate assistance;Sitting/lateral leans;Sit to/from stand   Toilet Transfer: Minimal assistance;Ambulation   Toileting- Clothing Manipulation and Hygiene: Moderate assistance;Sitting/lateral lean;Sit to/from stand       Functional mobility during ADLs: Minimal assistance;Rolling walker (2 wheels) General ADL Comments: Pt needing assist due to weakness and poor balance     Vision Baseline Vision/History: 1 Wears glasses Ability to See in Adequate Light: 0 Adequate  Patient Visual Report: No change from baseline Vision  Assessment?: No apparent visual deficits     Perception     Praxis      Pertinent Vitals/Pain Pain Assessment Pain Assessment: No/denies pain     Hand Dominance Right   Extremity/Trunk Assessment Upper Extremity Assessment Upper Extremity Assessment: Generalized weakness   Lower Extremity Assessment Lower Extremity Assessment: Defer to PT evaluation   Cervical / Trunk Assessment Cervical / Trunk Assessment: Kyphotic   Communication Communication Communication: HOH   Cognition Arousal/Alertness: Awake/alert Behavior During Therapy: WFL for tasks assessed/performed Overall Cognitive Status: No family/caregiver present to determine baseline cognitive functioning                                 General Comments: Decreased safety awareness. Unable to recall month and year. Inconsistent answering when asking questions.     General Comments  VSS on RA    Exercises     Shoulder Instructions      Home Living Family/patient expects to be discharged to:: Private residence Living Arrangements: Spouse/significant other;Children (wife, son in Social worker and daughter) Available Help at Discharge: Family Type of Home: House Home Access: Stairs to enter Secretary/administrator of Steps: 3-4 Entrance Stairs-Rails: Right Home Layout: Able to live on main level with bedroom/bathroom;Two level     Bathroom Shower/Tub: Producer, television/film/video: Standard     Home Equipment: Agricultural consultant (2 wheels);Cane - single point          Prior Functioning/Environment Prior Level of Function : Independent/Modified Independent;Driving             Mobility Comments: Uses RW for mobility ADLs Comments: Reports independence with ADLs.        OT Problem List: Decreased strength;Decreased activity tolerance;Impaired balance (sitting and/or standing);Decreased safety awareness;Decreased cognition;Decreased knowledge of use of DME or AE;Impaired UE functional use       OT Treatment/Interventions: Self-care/ADL training;Therapeutic exercise;Energy conservation;DME and/or AE instruction;Therapeutic activities;Cognitive remediation/compensation;Patient/family education;Balance training    OT Goals(Current goals can be found in the care plan section) Acute Rehab OT Goals Patient Stated Goal: To go home OT Goal Formulation: With patient Time For Goal Achievement: 04/11/22 Potential to Achieve Goals: Good ADL Goals Pt Will Perform Lower Body Bathing: with min guard assist;sitting/lateral leans;sit to/from stand Pt Will Perform Lower Body Dressing: with min guard assist;sitting/lateral leans;sit to/from stand Pt Will Transfer to Toilet: with supervision;ambulating Pt Will Perform Toileting - Clothing Manipulation and hygiene: with min guard assist;sitting/lateral leans;sit to/from stand Additional ADL Goal #1: Pt will follow 2-3 step commands 75% of the session. Additional ADL Goal #2: Pt will follow a multistep pathfinding task with no errors.  OT Frequency: Min 2X/week    Co-evaluation              AM-PAC OT "6 Clicks" Daily Activity     Outcome Measure Help from another person eating meals?: A Little Help from another person taking care of personal grooming?: A Little Help from another person toileting, which includes using toliet, bedpan, or urinal?: A Lot Help from another person bathing (including washing, rinsing, drying)?: A Lot Help from another person to put on and taking off regular upper body clothing?: A Little Help from another person to put on and taking off regular lower body clothing?: A Lot 6 Click Score: 15   End of Session Equipment Utilized During Treatment: Gait belt;Rolling walker (2 wheels) Nurse Communication:  Mobility status  Activity Tolerance: Patient tolerated treatment well Patient left: with call bell/phone within reach;in chair  OT Visit Diagnosis: Unsteadiness on feet (R26.81);Other abnormalities of gait and  mobility (R26.89);Muscle weakness (generalized) (M62.81)                Time: 8756-4332 OT Time Calculation (min): 17 min Charges:  OT General Charges $OT Visit: 1 Visit OT Evaluation $OT Eval Moderate Complexity: 1 Mod  Henya Aguallo H., OTR/L Acute Rehabilitation  Neah Sporrer Elane Bing Plume 03/28/2022, 4:18 PM

## 2022-03-28 NOTE — Progress Notes (Signed)
SLP Cancellation Note  Patient Details Name: Marcus Powers MRN: [id number] DOB: [birth year]   Cancelled treatment:        Pt currently in ECHO. Received order for speech-language-cognitive eval. Will continue efforts    Royce Macadamia 03/28/2022, 11:15 AM

## 2022-03-29 DIAGNOSIS — Z7982 Long term (current) use of aspirin: Secondary | ICD-10-CM | POA: Diagnosis not present

## 2022-03-29 DIAGNOSIS — I130 Hypertensive heart and chronic kidney disease with heart failure and stage 1 through stage 4 chronic kidney disease, or unspecified chronic kidney disease: Secondary | ICD-10-CM | POA: Diagnosis not present

## 2022-03-29 DIAGNOSIS — N179 Acute kidney failure, unspecified: Secondary | ICD-10-CM | POA: Diagnosis not present

## 2022-03-29 DIAGNOSIS — Z20822 Contact with and (suspected) exposure to covid-19: Secondary | ICD-10-CM | POA: Diagnosis not present

## 2022-03-29 DIAGNOSIS — N1832 Chronic kidney disease, stage 3b: Secondary | ICD-10-CM | POA: Diagnosis not present

## 2022-03-29 DIAGNOSIS — R2990 Unspecified symptoms and signs involving the nervous system: Secondary | ICD-10-CM | POA: Diagnosis not present

## 2022-03-29 DIAGNOSIS — Z87891 Personal history of nicotine dependence: Secondary | ICD-10-CM | POA: Diagnosis not present

## 2022-03-29 DIAGNOSIS — Z79899 Other long term (current) drug therapy: Secondary | ICD-10-CM | POA: Diagnosis not present

## 2022-03-29 DIAGNOSIS — Z952 Presence of prosthetic heart valve: Secondary | ICD-10-CM | POA: Diagnosis not present

## 2022-03-29 DIAGNOSIS — G459 Transient cerebral ischemic attack, unspecified: Secondary | ICD-10-CM | POA: Diagnosis not present

## 2022-03-29 DIAGNOSIS — I5032 Chronic diastolic (congestive) heart failure: Secondary | ICD-10-CM | POA: Diagnosis not present

## 2022-03-29 LAB — CBC WITH DIFFERENTIAL/PLATELET
Abs Immature Granulocytes: 0.03 10*3/uL (ref 0.00–0.07)
Basophils Absolute: 0.1 10*3/uL (ref 0.0–0.1)
Basophils Relative: 1 %
Eosinophils Absolute: 0.1 10*3/uL (ref 0.0–0.5)
Eosinophils Relative: 1 %
HCT: 34.2 % — ABNORMAL LOW (ref 39.0–52.0)
Hemoglobin: 11.5 g/dL — ABNORMAL LOW (ref 13.0–17.0)
Immature Granulocytes: 0 %
Lymphocytes Relative: 12 %
Lymphs Abs: 1.1 10*3/uL (ref 0.7–4.0)
MCH: 31.7 pg (ref 26.0–34.0)
MCHC: 33.6 g/dL (ref 30.0–36.0)
MCV: 94.2 fL (ref 80.0–100.0)
Monocytes Absolute: 0.8 10*3/uL (ref 0.1–1.0)
Monocytes Relative: 9 %
Neutro Abs: 6.9 10*3/uL (ref 1.7–7.7)
Neutrophils Relative %: 77 %
Platelets: 152 10*3/uL (ref 150–400)
RBC: 3.63 MIL/uL — ABNORMAL LOW (ref 4.22–5.81)
RDW: 14.6 % (ref 11.5–15.5)
WBC: 9 10*3/uL (ref 4.0–10.5)
nRBC: 0 % (ref 0.0–0.2)

## 2022-03-29 LAB — BASIC METABOLIC PANEL
Anion gap: 10 (ref 5–15)
BUN: 26 mg/dL — ABNORMAL HIGH (ref 8–23)
CO2: 24 mmol/L (ref 22–32)
Calcium: 9.1 mg/dL (ref 8.9–10.3)
Chloride: 108 mmol/L (ref 98–111)
Creatinine, Ser: 1.38 mg/dL — ABNORMAL HIGH (ref 0.61–1.24)
GFR, Estimated: 48 mL/min — ABNORMAL LOW (ref >60)
Glucose, Bld: 123 mg/dL — ABNORMAL HIGH (ref 70–99)
Potassium: 3.7 mmol/L (ref 3.5–5.1)
Sodium: 142 mmol/L (ref 135–145)

## 2022-03-29 LAB — MAGNESIUM: Magnesium: 2.1 mg/dL (ref 1.7–2.4)

## 2022-03-29 MED ORDER — HYDRALAZINE HCL 25 MG PO TABS: 25.0000 mg | ORAL_TABLET | Freq: Three times a day (TID) | ORAL | 0 refills | Status: DC

## 2022-03-29 MED ORDER — TORSEMIDE 20 MG PO TABS: 20.0000 mg | ORAL_TABLET | Freq: Every day | ORAL | 0 refills | Status: DC

## 2022-03-29 MED ORDER — POTASSIUM CHLORIDE 2 MEQ/ML IV SOLN
INTRAVENOUS | Status: DC
  Filled 2022-03-29: qty 1000

## 2022-03-29 MED ORDER — HALOPERIDOL LACTATE 5 MG/ML IJ SOLN
1.0000 mg | Freq: Four times a day (QID) | INTRAMUSCULAR | Status: DC | PRN
  Administered 2022-03-29: 1 mg via INTRAVENOUS
  Filled 2022-03-29: qty 1

## 2022-03-29 MED ORDER — HYDRALAZINE HCL 50 MG PO TABS: 50.0000 mg | ORAL_TABLET | Freq: Three times a day (TID) | ORAL | Status: DC

## 2022-03-29 MED ORDER — HYDRALAZINE HCL 20 MG/ML IJ SOLN
10.0000 mg | Freq: Four times a day (QID) | INTRAMUSCULAR | Status: DC | PRN
  Administered 2022-03-29: 10 mg via INTRAVENOUS
  Filled 2022-03-29: qty 1

## 2022-03-29 NOTE — TOC Transition Note (Signed)
Transition of Care [facility]) - CM/SW Discharge Note   Patient Details  Name: Marcus Powers MRN: [id number] Date of Birth: September 10, 1928  [facility] CM/SW Contact:  Lawerance Sabal, RN Phone Number: 03/29/2022, 11:57 AM   Clinical Narrative:    Sherron Monday w patient's daughter Nettie Elm who answered number on file for patient's wife. Verified patient has RW at home, no DME needs. Frances Furbish will set up home health visits. Nettie Elm asked that they call her at [phone] as her mom may not remember all instructions, this request and contact info was passed to hospital liaison for [facility]. No other TOC needs identified.       Barriers to Discharge: Continued Medical Work up   Patient Goals and CMS Choice Patient states their goals for this hospitalization and ongoing recovery are:: reurn home CMS Medicare.gov Compare Post Acute Care list provided to:: Patient Choice offered to / list presented to : Patient  Discharge Placement                       Discharge Plan and Services   Discharge Planning Services: CM Consult Post Acute Care Choice: Home Health                    HH Arranged: PT, OT [facility] Agency: [facility] Date [facility] Agency Contacted: 03/29/22 Time HH Agency Contacted: 1156 Representative spoke with at [facility] Agency: Kandee Keen  Social Determinants of Health (SDOH) Interventions     Readmission Risk Interventions    01/29/2022    2:16 PM 01/23/2022   10:35 AM  Readmission Risk Prevention Plan  Transportation Screening Complete Complete  PCP or Specialist Appt within 5-7 Days Complete Complete  Home Care Screening Complete Complete  Medication Review (RN CM) Complete Complete

## 2022-03-29 NOTE — Plan of Care (Signed)
  Problem: Education: Goal: Knowledge of General Education information will improve Description: Including pain rating scale, medication(s)/side effects and non-pharmacologic comfort measures Outcome: Progressing   Problem: Clinical Measurements: Goal: Will remain free from infection Outcome: Progressing Goal: Respiratory complications will improve Outcome: Progressing Goal: Cardiovascular complication will be avoided Outcome: Progressing   Problem: Clinical Measurements: Goal: Respiratory complications will improve Outcome: Progressing   Problem: Clinical Measurements: Goal: Cardiovascular complication will be avoided Outcome: Progressing

## 2022-03-29 NOTE — Progress Notes (Signed)
Pt's wife physically unable to transport pt home. Pt's niece will transport home, stated she will be here after 4pm but she want's to see what pt's mental status is before taking him back home to see if pt will receive adequate care.

## 2022-04-03 DIAGNOSIS — N179 Acute kidney failure, unspecified: Secondary | ICD-10-CM | POA: Diagnosis not present

## 2022-04-03 DIAGNOSIS — N1832 Chronic kidney disease, stage 3b: Secondary | ICD-10-CM | POA: Diagnosis not present

## 2022-04-03 DIAGNOSIS — G319 Degenerative disease of nervous system, unspecified: Secondary | ICD-10-CM | POA: Diagnosis not present

## 2022-04-03 DIAGNOSIS — I130 Hypertensive heart and chronic kidney disease with heart failure and stage 1 through stage 4 chronic kidney disease, or unspecified chronic kidney disease: Secondary | ICD-10-CM | POA: Diagnosis not present

## 2022-04-03 DIAGNOSIS — I5032 Chronic diastolic (congestive) heart failure: Secondary | ICD-10-CM | POA: Diagnosis not present

## 2022-04-04 DIAGNOSIS — G319 Degenerative disease of nervous system, unspecified: Secondary | ICD-10-CM | POA: Diagnosis not present

## 2022-04-04 DIAGNOSIS — N179 Acute kidney failure, unspecified: Secondary | ICD-10-CM | POA: Diagnosis not present

## 2022-04-04 DIAGNOSIS — I130 Hypertensive heart and chronic kidney disease with heart failure and stage 1 through stage 4 chronic kidney disease, or unspecified chronic kidney disease: Secondary | ICD-10-CM | POA: Diagnosis not present

## 2022-04-04 DIAGNOSIS — I5032 Chronic diastolic (congestive) heart failure: Secondary | ICD-10-CM | POA: Diagnosis not present

## 2022-04-04 DIAGNOSIS — N1832 Chronic kidney disease, stage 3b: Secondary | ICD-10-CM | POA: Diagnosis not present

## 2022-04-09 DIAGNOSIS — I5032 Chronic diastolic (congestive) heart failure: Secondary | ICD-10-CM | POA: Diagnosis not present

## 2022-04-09 DIAGNOSIS — I130 Hypertensive heart and chronic kidney disease with heart failure and stage 1 through stage 4 chronic kidney disease, or unspecified chronic kidney disease: Secondary | ICD-10-CM | POA: Diagnosis not present

## 2022-04-09 DIAGNOSIS — N179 Acute kidney failure, unspecified: Secondary | ICD-10-CM | POA: Diagnosis not present

## 2022-04-09 DIAGNOSIS — N1832 Chronic kidney disease, stage 3b: Secondary | ICD-10-CM | POA: Diagnosis not present

## 2022-04-09 DIAGNOSIS — G319 Degenerative disease of nervous system, unspecified: Secondary | ICD-10-CM | POA: Diagnosis not present

## 2022-04-10 DIAGNOSIS — I5032 Chronic diastolic (congestive) heart failure: Secondary | ICD-10-CM | POA: Diagnosis not present

## 2022-04-10 DIAGNOSIS — I130 Hypertensive heart and chronic kidney disease with heart failure and stage 1 through stage 4 chronic kidney disease, or unspecified chronic kidney disease: Secondary | ICD-10-CM | POA: Diagnosis not present

## 2022-04-10 DIAGNOSIS — G319 Degenerative disease of nervous system, unspecified: Secondary | ICD-10-CM | POA: Diagnosis not present

## 2022-04-10 DIAGNOSIS — N179 Acute kidney failure, unspecified: Secondary | ICD-10-CM | POA: Diagnosis not present

## 2022-04-10 DIAGNOSIS — N1832 Chronic kidney disease, stage 3b: Secondary | ICD-10-CM | POA: Diagnosis not present

## 2022-04-11 ENCOUNTER — Ambulatory Visit: Payer: Medicare Other | Admitting: Cardiology

## 2022-04-11 ENCOUNTER — Encounter: Payer: Self-pay | Admitting: Cardiology

## 2022-04-11 VITALS — BP 135/62 | HR 84 | Temp 98.0°F | Resp 16 | Ht 66.0 in | Wt 138.8 lb

## 2022-04-11 DIAGNOSIS — Z952 Presence of prosthetic heart valve: Secondary | ICD-10-CM | POA: Diagnosis not present

## 2022-04-11 DIAGNOSIS — I10 Essential (primary) hypertension: Secondary | ICD-10-CM | POA: Diagnosis not present

## 2022-04-11 NOTE — Progress Notes (Signed)
  Patient referred by Griffin, John, MD for aortic stenosis  Subjective:   Marcus Powers, male    DOB: 05/23/1928, 88 y.o.   MRN: [id number]   Chief Complaint  Patient presents with   Hypertension   Severe aortic stenosis   Follow-up    1-2 weeks     HPI  88 y.o. African-American male with hypertension, hyperlipidemia, s/p TAVR for severe AS   Patient was recently hospitalized with hypotension, AI, likely due to ongoing and inadvertent usage of amlodipine, torsemide, and spironolactone. He also had transient slurred speech and right facial droop. So TIA in the setting of dehydration. All symptoms resolved on discharge. Patient is here today with his son. I verified medications with his daughter over the phone. It appears that he does not take hydralazine regularly.     Current Outpatient Medications:    acetaminophen (TYLENOL) 650 MG CR tablet, Take 650 mg by mouth daily as needed for pain., Disp: , Rfl:    aspirin 81 MG chewable tablet, Chew 1 tablet (81 mg total) by mouth daily., Disp: 90 tablet, Rfl: 3   carvedilol (COREG) 3.125 MG tablet, Take 1 tablet (3.125 mg total) by mouth 2 (two) times daily with a meal., Disp: 60 tablet, Rfl: 0   clopidogrel (PLAVIX) 75 MG tablet, Take 1 tablet (75 mg total) by mouth daily., Disp: 20 tablet, Rfl: 0   ezetimibe (ZETIA) 10 MG tablet, Take 1 tablet (10 mg total) by mouth daily., Disp: 30 tablet, Rfl: 0   hydrALAZINE (APRESOLINE) 25 MG tablet, Take 1 tablet (25 mg total) by mouth every 8 (eight) hours., Disp: 90 tablet, Rfl: 0   rosuvastatin (CRESTOR) 20 MG tablet, Take 1 tablet (20 mg total) by mouth daily., Disp: 30 tablet, Rfl: 0   torsemide (DEMADEX) 20 MG tablet, Take 1 tablet (20 mg total) by mouth daily., Disp: 30 tablet, Rfl: 0    Cardiovascular and other pertinent studies:  EKG 03/27/2022: Sinus rhythm LVH Supraventricular bigeminy  Echocardiogram 02/26/2022:  1. Left ventricular ejection fraction, by estimation, is 55 to  60%. The  left ventricle has normal function. The left ventricle has no regional  wall motion abnormalities. Left ventricular diastolic parameters are  consistent with Grade I diastolic  dysfunction (impaired relaxation).   2. Right ventricular systolic function is normal. The right ventricular  size is normal. There is normal pulmonary artery systolic pressure. The  estimated right ventricular systolic pressure is 25.3 mmHg.   3. Left atrial size was mild to moderately dilated.   4. The mitral valve is normal in structure. Mild mitral valve  regurgitation.   5. The aortic valve has been repaired/replaced. Aortic valve  regurgitation is not visualized. There is a 26 mm Sapien prosthetic (TAVR)  valve present in the aortic position. Procedure Date: 01/28/2022. Aortic  valve mean gradient measures 8.0 mmHg.  Aortic valve Vmax measures 1.93 m/s. Aortic valve acceleration time  measures 92 msec.   Comparison(s): Prior images reviewed side by side. TAVR gradients are  unchanged.   01/28/2022: 26 mm Sapien 3 TAVR by Dr. Cooper  EKG 12/20/2021: Sinus tachycardia 103 bpm  Left atrial enlargement Left ventricular hypertrophy ST-T changes related to LVH  Echocardiogram 12/13/2021: 1. Left ventricular ejection fraction, by estimation, is 50 to 55%. The  left ventricle has low normal function. The left ventricle has no regional  wall motion abnormalities. There is mild concentric left ventricular  hypertrophy. Left ventricular  diastolic parameters are consistent with Grade II diastolic   dysfunction  (pseudonormalization).   2. Right ventricular systolic function is normal. The right ventricular  size is normal. There is mildly elevated pulmonary artery systolic  pressure.   3. Left atrial size was moderately dilated.   4. The mitral valve is degenerative. No evidence of mitral valve  regurgitation.   5. Difficult assessment for mitral stenosis, suboptimal valve morpology,  continuity  equation valve area deferred in the setting of AI.   6. The aortic valve is calcified. Aortic valve regurgitation is mild to  moderate. Severe aortic valve stenosis. Aortic valve mean gradient  measures 35.0 mmHg. Aortic valve Vmax measures 3.74 m/s. Aortic valve  acceleration time measures 95 msec.  Decreased stroke voluem index.   7. The inferior vena cava is normal in size with greater than 50%  respiratory variability, suggesting right atrial pressure of 3 mmHg.   RHC, coronary angiography 12/13/2021: LM: Normal LAD: Prox focal 20% stenosis Lcx: Normal RCA: Mid focal 20% stenosis   No obstructive coronary artery disease   RA: 7 mmHg RV: 44/1 mmHg PA: 34/12 mmHg, mPAP 27 mmHg PCW: 11 mmHg   CO: 4.9 L/min CI: 2.8 L/min/m2   Valvular cardiomyopathy, now compensated at rest NYHA class III   Okay to perform TAVR outpatient. Continue pre-TAVR workup.  Recent labs: 03/29/2022: Glucose 123, BUN/Cr 26/1.38. EGFR 48. Na/K 142/3.7 H/H 11/34. MCV 94. Platelets 152 HbA1C 6.0% Chol 184, TG 95, HDL 71, LDL 94    Review of Systems  Cardiovascular:  Negative for chest pain, dyspnea on exertion, leg swelling, palpitations and syncope.         Vitals:   04/11/22 1256  BP: 135/62  Pulse: 84  Resp: 16  Temp: 98 F (36.7 C)  SpO2: 97%    Body mass index is 22.4 kg/m. Filed Weights   04/11/22 1256  Weight: 138 lb 12.8 oz (63 kg)     Objective:   Physical Exam Vitals and nursing note reviewed.  Constitutional:      General: He is not in acute distress. Neck:     Vascular: No JVD.  Cardiovascular:     Rate and Rhythm: Normal rate and regular rhythm.     Pulses: Normal pulses.     Heart sounds: No murmur heard. Pulmonary:     Effort: Pulmonary effort is normal.     Breath sounds: Normal breath sounds. No wheezing or rales.  Musculoskeletal:     Right lower leg: No edema.     Left lower leg: No edema.        ICD-10-CM   1. Essential hypertension  I10      2. S/P TAVR (transcatheter aortic valve replacement)  Z95.2          Assessment & Recommendations:     88 y.o. African-American male with hypertension, hyperlipidemia, s/p TAVR for severe AS  Aortic stenosis: Resolved s/p TAVR. Reemphasized importance of aspirin 81 mg daily, and amoxicillin prior to any dental procedures.  Hypertension: Avoid spironolactone. Take torsemide only as needed for leg edema.  Continue coreg 3/125mg bid. Stopped hydralazine, as he is not taking it regularly anyway.   TIA: Continue Aspirin and plavix for one more week. Can stop plavix after he finishes current bottle. Continue statin.   F/u in 3 months      J , MD Pager: [phone] Office: [phone]  

## 2022-04-11 NOTE — Patient Instructions (Signed)
Stop hydralazine Stop plavix after you finish the bottle Take torsemide (water pill) only if you have leg edema

## 2022-04-14 DIAGNOSIS — N1832 Chronic kidney disease, stage 3b: Secondary | ICD-10-CM | POA: Diagnosis not present

## 2022-04-14 DIAGNOSIS — N179 Acute kidney failure, unspecified: Secondary | ICD-10-CM | POA: Diagnosis not present

## 2022-04-14 DIAGNOSIS — I5032 Chronic diastolic (congestive) heart failure: Secondary | ICD-10-CM | POA: Diagnosis not present

## 2022-04-14 DIAGNOSIS — I130 Hypertensive heart and chronic kidney disease with heart failure and stage 1 through stage 4 chronic kidney disease, or unspecified chronic kidney disease: Secondary | ICD-10-CM | POA: Diagnosis not present

## 2022-04-14 DIAGNOSIS — G319 Degenerative disease of nervous system, unspecified: Secondary | ICD-10-CM | POA: Diagnosis not present

## 2022-04-17 DIAGNOSIS — I130 Hypertensive heart and chronic kidney disease with heart failure and stage 1 through stage 4 chronic kidney disease, or unspecified chronic kidney disease: Secondary | ICD-10-CM | POA: Diagnosis not present

## 2022-04-17 DIAGNOSIS — G319 Degenerative disease of nervous system, unspecified: Secondary | ICD-10-CM | POA: Diagnosis not present

## 2022-04-17 DIAGNOSIS — I5032 Chronic diastolic (congestive) heart failure: Secondary | ICD-10-CM | POA: Diagnosis not present

## 2022-04-17 DIAGNOSIS — N1832 Chronic kidney disease, stage 3b: Secondary | ICD-10-CM | POA: Diagnosis not present

## 2022-04-17 DIAGNOSIS — N179 Acute kidney failure, unspecified: Secondary | ICD-10-CM | POA: Diagnosis not present

## 2022-04-22 ENCOUNTER — Ambulatory Visit: Payer: Medicare Other | Admitting: Neurology

## 2022-04-22 ENCOUNTER — Encounter: Payer: Self-pay | Admitting: Neurology

## 2022-04-22 VITALS — BP 151/70 | HR 68 | Ht 66.0 in | Wt 146.5 lb

## 2022-04-22 DIAGNOSIS — I5032 Chronic diastolic (congestive) heart failure: Secondary | ICD-10-CM | POA: Diagnosis not present

## 2022-04-22 DIAGNOSIS — I130 Hypertensive heart and chronic kidney disease with heart failure and stage 1 through stage 4 chronic kidney disease, or unspecified chronic kidney disease: Secondary | ICD-10-CM | POA: Diagnosis not present

## 2022-04-22 DIAGNOSIS — G459 Transient cerebral ischemic attack, unspecified: Secondary | ICD-10-CM | POA: Diagnosis not present

## 2022-04-22 DIAGNOSIS — N179 Acute kidney failure, unspecified: Secondary | ICD-10-CM | POA: Diagnosis not present

## 2022-04-22 DIAGNOSIS — N1832 Chronic kidney disease, stage 3b: Secondary | ICD-10-CM | POA: Diagnosis not present

## 2022-04-22 DIAGNOSIS — G319 Degenerative disease of nervous system, unspecified: Secondary | ICD-10-CM | POA: Diagnosis not present

## 2022-04-22 NOTE — Patient Instructions (Addendum)
Continue current medications  Follow up with your PCP  Follow up in 1 to 2 months for formal memory evaluation.

## 2022-04-22 NOTE — Progress Notes (Signed)
[facility]  PATIENT: Marcus Powers DOB: Nov 01, 1927  REQUESTING CLINICIAN: Leroy Sea, MD HISTORY FROM: Patient and son in law REASON FOR VISIT: TIA Follow up    HISTORICAL  CHIEF COMPLAINT:  Chief Complaint  Patient presents with   Hospitalization Follow-up    Rm 15. Accompanied by son-in-law. NP/internal hospital referral for TIA.    HISTORY OF PRESENT ILLNESS:  This is a 88 year old male with past medical history of hypertension, hyperlipidemia, severe aortic stenosis status post TAVR who is presenting after being admitted to hospital on June 22 for slurred speech.  Per son-in-law, patient experienced slurred speech at home lasting about 30 minutes.  He initially gave him 1 dose of aspirin but his symptoms did not improve therefore EMS was called.  In the ED, he did have full stroke work-up including MRI and MRA which was negative for any acute stroke and no large vessel occlusion.  His symptoms improved and his speech was back to normal while he was in the ED.  On discharge, he was recommended aspirin and Plavix for a total of 21 days and to continue with aspirin thereafter.  He is compliant with his medications.  His LDL was elevated, therefore his Pravachol was changed to Crestor and Zetia added. Denies any worsening speech since being home.   OTHER MEDICAL CONDITIONS: Hypertension, Hyperlipidemia, AS s/p TAVR, CKD stage 3   REVIEW OF SYSTEMS: Full 14 system review of systems performed and negative with exception of: As noted in the HPI.   ALLERGIES: Allergies  Allergen Reactions   Benazepril Hcl Cough    HOME MEDICATIONS: Outpatient Medications Prior to Visit  Medication Sig Dispense Refill   acetaminophen (TYLENOL) 650 MG CR tablet Take 650 mg by mouth daily as needed for pain.     aspirin 81 MG chewable tablet Chew 1 tablet (81 mg total) by mouth daily. 90 tablet 3   carvedilol (COREG) 3.125 MG tablet Take 1 tablet (3.125 mg total) by  mouth 2 (two) times daily with a meal. 60 tablet 0   clopidogrel (PLAVIX) 75 MG tablet Take 1 tablet (75 mg total) by mouth daily. 20 tablet 0   ezetimibe (ZETIA) 10 MG tablet Take 1 tablet (10 mg total) by mouth daily. 30 tablet 0   rosuvastatin (CRESTOR) 20 MG tablet Take 1 tablet (20 mg total) by mouth daily. 30 tablet 0   torsemide (DEMADEX) 20 MG tablet Take 1 tablet (20 mg total) by mouth daily. 30 tablet 0   No facility-administered medications prior to visit.    PAST MEDICAL HISTORY: Past Medical History:  Diagnosis Date   Chronic diastolic heart failure (HCC)    Hyperlipidemia    Hypertension    S/P TAVR (transcatheter aortic valve replacement) 01/28/2022   s/p TAVR with a 26 mm Edwards S3UR via the TF approach by Dr. Clifton James & Dr. Laneta Simmers   Severe aortic stenosis    Spinal stenosis     PAST SURGICAL HISTORY: Past Surgical History:  Procedure Laterality Date   BIOPSY BREAST     EYE SURGERY Bilateral    RIGHT HEART CATH AND CORONARY ANGIOGRAPHY N/A 12/13/2021   Procedure: RIGHT HEART CATH AND CORONARY ANGIOGRAPHY;  Surgeon: Elder Negus, MD;  Location: [facility]VASIVE [facility];  Service: Cardiovascular;  Laterality: N/A;   TEE WITHOUT CARDIOVERSION N/A 01/28/2022   Procedure: TRANSESOPHAGEAL ECHOCARDIOGRAM (TEE);  Surgeon: Kathleene Hazel, MD;  Location: [facility] OR;  Service: Open Heart Surgery;  Laterality: N/A;  TONSILLECTOMY     TRANSCATHETER AORTIC VALVE REPLACEMENT, TRANSFEMORAL N/A 01/28/2022   Procedure: Transcatheter Aortic Valve Replacement, Transfemoral;  Surgeon: Kathleene Hazel, MD;  Location: [facility] OR;  Service: Open Heart Surgery;  Laterality: N/A;   ULTRASOUND GUIDANCE FOR VASCULAR ACCESS Bilateral 01/28/2022   Procedure: ULTRASOUND GUIDANCE FOR VASCULAR ACCESS;  Surgeon: Kathleene Hazel, MD;  Location: [facility] OR;  Service: Open Heart Surgery;  Laterality: Bilateral;    FAMILY HISTORY: Family History  Problem Relation Age of Onset   Cancer  Mother    Stroke Father     SOCIAL HISTORY: Social History   Socioeconomic History   Marital status: Married    Spouse name: Not on file   Number of children: 1   Years of education: Not on file   Highest education level: Not on file  Occupational History   Not on file  Tobacco Use   Smoking status: Former    Packs/day: 0.25    Years: 5.00    Total pack years: 1.25    Types: Cigarettes    Quit date: 66    Years since quitting: 54.5   Smokeless tobacco: Never  Vaping Use   Vaping Use: Never used  Substance and Sexual Activity   Alcohol use: No   Drug use: No   Sexual activity: Not on file  Other Topics Concern   Not on file  Social History Narrative   Not on file   Social Determinants of Health   Financial Resource Strain: Not on file  Food Insecurity: Not on file  Transportation Needs: Not on file  Physical Activity: Not on file  Stress: Not on file  Social Connections: Not on file  Intimate Partner Violence: Not on file    PHYSICAL EXAM  GENERAL EXAM/CONSTITUTIONAL: Vitals:  Vitals:   04/22/22 1308  BP: (!) 151/70  Pulse: 68  Weight: 146 lb 8 oz (66.5 kg)  Height: 5\' 6"  (1.676 m)   Body mass index is 23.65 kg/m. Wt Readings from Last 3 Encounters:  04/22/22 146 lb 8 oz (66.5 kg)  04/11/22 138 lb 12.8 oz (63 kg)  03/27/22 149 lb 7.6 oz (67.8 kg)   Patient is in no distress; well developed, nourished and groomed; neck is supple, sitting in his wheelchair   EYES: Pupils round and reactive to light, Visual fields full to confrontation, Extraocular movements intacts,   MUSCULOSKELETAL: Gait, strength, tone, movements noted in Neurologic exam below  NEUROLOGIC: MENTAL STATUS:      No data to display         awake, alert, oriented to person, cannot tell me year or month or current president  normal attention and concentration language fluent, comprehension intact, naming intact fund of knowledge appropriate  CRANIAL NERVE:  2nd, 3rd,  4th, 6th - pupils equal and reactive to light, visual fields full to confrontation, extraocular muscles intact, no nystagmus 5th - facial sensation symmetric 7th - facial strength symmetric 8th - hearing intact 9th - palate elevates symmetrically, uvula midline 11th - shoulder shrug symmetric 12th - tongue protrusion midline  MOTOR:  normal bulk and tone, at least anti gravity in the BUE, BLE  SENSORY:  normal and symmetric to light touch  COORDINATION:  finger-nose-finger, fine finger movements normal  GAIT/STATION:  Deferred      DIAGNOSTIC DATA (LABS, IMAGING, TESTING) - I reviewed patient records, labs, notes, testing and imaging myself where available.  Lab Results  Component Value Date   WBC 9.0 03/29/2022  HGB 11.5 (L) 03/29/2022   HCT 34.2 (L) 03/29/2022   MCV 94.2 03/29/2022   PLT 152 03/29/2022      Component Value Date/Time   NA 142 03/29/2022 0114   NA 140 03/21/2022 1445   K 3.7 03/29/2022 0114   CL 108 03/29/2022 0114   CO2 24 03/29/2022 0114   GLUCOSE 123 (H) 03/29/2022 0114   BUN 26 (H) 03/29/2022 0114   BUN 12 03/21/2022 1445   CREATININE 1.38 (H) 03/29/2022 0114   CALCIUM 9.1 03/29/2022 0114   PROT 6.5 03/28/2022 0438   ALBUMIN 3.6 03/28/2022 0438   AST 20 03/28/2022 0438   ALT 14 03/28/2022 0438   ALKPHOS 60 03/28/2022 0438   BILITOT 1.0 03/28/2022 0438   GFRNONAA 48 (L) 03/29/2022 0114   GFRAA >60 05/13/2017 0233   Lab Results  Component Value Date   CHOL 184 03/28/2022   HDL 71 03/28/2022   LDLCALC 94 03/28/2022   TRIG 95 03/28/2022   CHOLHDL 2.6 03/28/2022   Lab Results  Component Value Date   HGBA1C 6.0 (H) 03/28/2022   No results found for: "VITAMINB12" No results found for: "TSH"  MRI and MRA Head 03/27/22 1. No acute intracranial abnormality. 2. Normal intracranial MRA. 3. Chronic small vessel ischemic changes and volume loss.   ASSESSMENT AND PLAN  88 y.o. year old male with vascular risk factor including  hypertension, hyperlipidemia, heart disease who is presenting for follow-up after being admitted to hospital for TIA.  Patient initial symptoms included dysarthria which resolved in the ED.  He is MRI/MRA was negative for any acute stroke and no large vessel occlusion.  He was discharged on DAPT Aspirin and Plavix for 21 days and continued on aspirin thereafter.  He is compliant with his current medications including Crestor.  At the moment I will continue patient on same medications, no change.  Son-in-law also reports concern about memory, I did advise them to follow-up in 1 to 2 months for for a formal memory evaluation, they are comfortable with plans.  Return sooner if worse.   1. TIA (transient ischemic attack)     Patient Instructions  Continue current medications  Follow up with your PCP  Follow up in 1 to 2 months for formal memory evaluation.  No orders of the defined types were placed in this encounter.   No orders of the defined types were placed in this encounter.   Return in about 1 year (around 04/23/2023).  I have spent a total of 45 minutes dedicated to this patient today, preparing to see patient, performing a medically appropriate examination and evaluation, ordering tests and/or medications and procedures, and counseling and educating the patient/family/caregiver; independently interpreting result and communicating results to the family/patient/caregiver; and documenting clinical information in the electronic medical record.   Windell Norfolk, MD 04/22/2022, 6:58 PM  [facility] [address] [place], [place] [address] [phone]

## 2022-04-24 DIAGNOSIS — N1832 Chronic kidney disease, stage 3b: Secondary | ICD-10-CM | POA: Diagnosis not present

## 2022-04-24 DIAGNOSIS — N179 Acute kidney failure, unspecified: Secondary | ICD-10-CM | POA: Diagnosis not present

## 2022-04-24 DIAGNOSIS — I130 Hypertensive heart and chronic kidney disease with heart failure and stage 1 through stage 4 chronic kidney disease, or unspecified chronic kidney disease: Secondary | ICD-10-CM | POA: Diagnosis not present

## 2022-04-24 DIAGNOSIS — I5032 Chronic diastolic (congestive) heart failure: Secondary | ICD-10-CM | POA: Diagnosis not present

## 2022-04-24 DIAGNOSIS — G319 Degenerative disease of nervous system, unspecified: Secondary | ICD-10-CM | POA: Diagnosis not present

## 2022-04-30 DIAGNOSIS — N179 Acute kidney failure, unspecified: Secondary | ICD-10-CM | POA: Diagnosis not present

## 2022-04-30 DIAGNOSIS — I5032 Chronic diastolic (congestive) heart failure: Secondary | ICD-10-CM | POA: Diagnosis not present

## 2022-04-30 DIAGNOSIS — I130 Hypertensive heart and chronic kidney disease with heart failure and stage 1 through stage 4 chronic kidney disease, or unspecified chronic kidney disease: Secondary | ICD-10-CM | POA: Diagnosis not present

## 2022-04-30 DIAGNOSIS — N1832 Chronic kidney disease, stage 3b: Secondary | ICD-10-CM | POA: Diagnosis not present

## 2022-04-30 DIAGNOSIS — G319 Degenerative disease of nervous system, unspecified: Secondary | ICD-10-CM | POA: Diagnosis not present

## 2022-05-08 DIAGNOSIS — R490 Dysphonia: Secondary | ICD-10-CM | POA: Diagnosis not present

## 2022-05-08 DIAGNOSIS — Z03818 Encounter for observation for suspected exposure to other biological agents ruled out: Secondary | ICD-10-CM | POA: Diagnosis not present

## 2022-05-08 DIAGNOSIS — G459 Transient cerebral ischemic attack, unspecified: Secondary | ICD-10-CM | POA: Diagnosis not present

## 2022-05-12 ENCOUNTER — Encounter: Payer: Self-pay | Admitting: Neurology

## 2022-05-12 NOTE — Progress Notes (Signed)
Spoke with patient daughter and son in Social worker.  At last visit in July 18, at that time, he completed DAPT (Plavix and Aspirin) for a total of 21 days and continued with aspirin alone, plan was for patient to continue with Aspirin alone thereafter.   He presented again on 8/3 at Dr. Orson Aloe office reporting episodes of slurred speech, per family infectious work up was negative.  Dr. Orson Aloe spoke with Dr. Valentina Lucks (Patient PMD) and plan was for him to resume Plavix with Aspirin.   Patient most recent MR angiogram does not show any high grade stenosis, and he does not have a history of cardiac stent. Due to increase risk of bleeding, I would recommend to discontinue Plavix and continue with Aspirin alone.  They will see Dr. Valentina Lucks in a month and at that time, they will clarify with him how long he will have to be on Plavix.  I will also forward this note to Dr. Valentina Lucks   Dr. Teresa Coombs

## 2022-05-14 ENCOUNTER — Telehealth: Payer: Self-pay

## 2022-05-14 NOTE — Telephone Encounter (Signed)
Pt son in law Marcus Powers who comes in with him to all his visits with [place] is calling us to inform us that Pt has pain on his left side of neck and goes to his left side of his ankle. Pt pain comes and goes with burning. Marcus Powers mention that pt got a monitor with his PCP and only wore it for one day, pt send in the monitor yesterday. Pt son in law would like to know what should they do or if you can give him a call at [phone] (virgil- son in law). Pt denies cp, sob, headaches, nor dizziness.

## 2022-05-14 NOTE — Telephone Encounter (Signed)
Called pt daughter no answer left a vm, tried to call the number above but no answer.

## 2022-05-14 NOTE — Telephone Encounter (Signed)
In absence of chest pain, symptoms very unlikely to be of cardiac origin. Continue follow up with PCP.   Thanks MJP

## 2022-05-29 DIAGNOSIS — I5030 Unspecified diastolic (congestive) heart failure: Secondary | ICD-10-CM | POA: Diagnosis not present

## 2022-05-29 DIAGNOSIS — E7800 Pure hypercholesterolemia, unspecified: Secondary | ICD-10-CM | POA: Diagnosis not present

## 2022-05-29 DIAGNOSIS — I10 Essential (primary) hypertension: Secondary | ICD-10-CM | POA: Diagnosis not present

## 2022-06-05 NOTE — Telephone Encounter (Signed)
Called and spoke with patient regarding patients symptoms.

## 2022-06-13 DIAGNOSIS — G459 Transient cerebral ischemic attack, unspecified: Secondary | ICD-10-CM | POA: Diagnosis not present

## 2022-06-20 ENCOUNTER — Ambulatory Visit: Payer: Medicare Other | Admitting: Cardiology

## 2022-06-20 NOTE — Progress Notes (Signed)
Error

## 2022-06-26 ENCOUNTER — Ambulatory Visit: Payer: Medicare Other | Admitting: Cardiology

## 2022-06-26 ENCOUNTER — Encounter: Payer: Self-pay | Admitting: Cardiology

## 2022-06-26 VITALS — BP 154/66 | HR 83 | Temp 98.0°F | Resp 16 | Ht 66.0 in | Wt 145.0 lb

## 2022-06-26 DIAGNOSIS — I10 Essential (primary) hypertension: Secondary | ICD-10-CM

## 2022-06-26 DIAGNOSIS — I5032 Chronic diastolic (congestive) heart failure: Secondary | ICD-10-CM | POA: Diagnosis not present

## 2022-06-26 DIAGNOSIS — Z952 Presence of prosthetic heart valve: Secondary | ICD-10-CM | POA: Diagnosis not present

## 2022-06-26 MED ORDER — CARVEDILOL 6.25 MG PO TABS: 6.2500 mg | ORAL_TABLET | Freq: Two times a day (BID) | ORAL | 3 refills | Status: AC

## 2022-06-26 NOTE — Patient Instructions (Signed)
Take 2 pills of Carvedilol 3.125 mg twice daily. Once you run out of this bottle, start the new prescription carvedilol 6.25 mg 1 pill twice daily

## 2022-06-26 NOTE — Progress Notes (Signed)
Patient referred by Lavone Orn, MD for aortic stenosis  Subjective:   Marcus Powers, male    DOB: [birth year], 88 y.o.   MRN: [id number]   Chief Complaint  Patient presents with   Hypertension   S/P TAVR   Follow-up    3 month     HPI  88 y.o. African-American male with hypertension, hyperlipidemia, s/p TAVR for severe AS  Patient is here today with his wife. Medications reconciled after talking to patient's daughter over the phone. He is doing well, denies any chest pain, dyspnea.    Current Outpatient Medications:    acetaminophen (TYLENOL) 650 MG CR tablet, Take 650 mg by mouth daily as needed for pain., Disp: , Rfl:    aspirin 81 MG chewable tablet, Chew 1 tablet (81 mg total) by mouth daily., Disp: 90 tablet, Rfl: 3   carvedilol (COREG) 3.125 MG tablet, Take 1 tablet (3.125 mg total) by mouth 2 (two) times daily with a meal., Disp: 60 tablet, Rfl: 0   clopidogrel (PLAVIX) 75 MG tablet, Take 1 tablet (75 mg total) by mouth daily., Disp: 20 tablet, Rfl: 0   ezetimibe (ZETIA) 10 MG tablet, Take 1 tablet (10 mg total) by mouth daily., Disp: 30 tablet, Rfl: 0   rosuvastatin (CRESTOR) 20 MG tablet, Take 1 tablet (20 mg total) by mouth daily., Disp: 30 tablet, Rfl: 0   torsemide (DEMADEX) 20 MG tablet, Take 1 tablet (20 mg total) by mouth daily., Disp: 30 tablet, Rfl: 0    Cardiovascular and other pertinent studies:  EKG 03/27/2022: Sinus rhythm LVH Supraventricular bigeminy  Echocardiogram 02/26/2022:  1. Left ventricular ejection fraction, by estimation, is 55 to 60%. The  left ventricle has normal function. The left ventricle has no regional  wall motion abnormalities. Left ventricular diastolic parameters are  consistent with Grade I diastolic  dysfunction (impaired relaxation).   2. Right ventricular systolic function is normal. The right ventricular  size is normal. There is normal pulmonary artery systolic pressure. The  estimated right ventricular systolic  pressure is 94.7 mmHg.   3. Left atrial size was mild to moderately dilated.   4. The mitral valve is normal in structure. Mild mitral valve  regurgitation.   5. The aortic valve has been repaired/replaced. Aortic valve  regurgitation is not visualized. There is a 26 mm Sapien prosthetic (TAVR)  valve present in the aortic position. Procedure Date: 01/28/2022. Aortic  valve mean gradient measures 8.0 mmHg.  Aortic valve Vmax measures 1.93 m/s. Aortic valve acceleration time  measures 92 msec.   Comparison(s): Prior images reviewed side by side. TAVR gradients are  unchanged.   01/28/2022: 26 mm Sapien 3 TAVR by Dr. Burt Knack  EKG 12/20/2021: Sinus tachycardia 103 bpm  Left atrial enlargement Left ventricular hypertrophy ST-T changes related to LVH  Echocardiogram 12/13/2021: 1. Left ventricular ejection fraction, by estimation, is 50 to 55%. The  left ventricle has low normal function. The left ventricle has no regional  wall motion abnormalities. There is mild concentric left ventricular  hypertrophy. Left ventricular  diastolic parameters are consistent with Grade II diastolic dysfunction  (pseudonormalization).   2. Right ventricular systolic function is normal. The right ventricular  size is normal. There is mildly elevated pulmonary artery systolic  pressure.   3. Left atrial size was moderately dilated.   4. The mitral valve is degenerative. No evidence of mitral valve  regurgitation.   5. Difficult assessment for mitral stenosis, suboptimal valve morpology,  continuity equation valve  area deferred in the setting of AI.   6. The aortic valve is calcified. Aortic valve regurgitation is mild to  moderate. Severe aortic valve stenosis. Aortic valve mean gradient  measures 35.0 mmHg. Aortic valve Vmax measures 3.74 m/s. Aortic valve  acceleration time measures 95 msec.  Decreased stroke voluem index.   7. The inferior vena cava is normal in size with greater than 50%   respiratory variability, suggesting right atrial pressure of 3 mmHg.   RHC, coronary angiography 12/13/2021: LM: Normal LAD: Prox focal 20% stenosis Lcx: Normal RCA: Mid focal 20% stenosis   No obstructive coronary artery disease   RA: 7 mmHg RV: 44/1 mmHg PA: 34/12 mmHg, mPAP 27 mmHg PCW: 11 mmHg   CO: 4.9 L/min CI: 2.8 L/min/m2   Valvular cardiomyopathy, now compensated at rest NYHA class III   Okay to perform TAVR outpatient. Continue pre-TAVR workup.  Recent labs: 03/29/2022: Glucose 123, BUN/Cr 26/1.38. EGFR 48. Na/K 142/3.7 H/H 11/34. MCV 94. Platelets 152 HbA1C 6.0% Chol 184, TG 95, HDL 71, LDL 94    Review of Systems  Cardiovascular:  Negative for chest pain, dyspnea on exertion, leg swelling, palpitations and syncope.         Vitals:   06/26/22 1328  BP: (!) 154/66  Pulse: 83  Resp: 16  Temp: 98 F (36.7 C)  SpO2: 98%    Body mass index is 23.4 kg/m. Filed Weights   06/26/22 1328  Weight: 145 lb (65.8 kg)     Objective:   Physical Exam Vitals and nursing note reviewed.  Constitutional:      General: He is not in acute distress. Neck:     Vascular: No JVD.  Cardiovascular:     Rate and Rhythm: Normal rate and regular rhythm.     Pulses: Normal pulses.     Heart sounds: No murmur heard. Pulmonary:     Effort: Pulmonary effort is normal.     Breath sounds: Normal breath sounds. No wheezing or rales.  Musculoskeletal:     Right lower leg: No edema.     Left lower leg: No edema.       ICD-10-CM   1. Chronic heart failure with preserved ejection fraction (HCC)  I50.32     2. Essential hypertension  I10     3. S/P TAVR (transcatheter aortic valve replacement)  Z95.2          Assessment & Recommendations:    88 y.o. African-American male with hypertension, hyperlipidemia, s/p TAVR for severe AS  Aortic stenosis: Resolved s/p TAVR. Reemphasized importance of aspirin 81 mg daily, and amoxicillin prior to any dental  procedures.  Hypertension: Avoid spironolactone. Take torsemide only as needed for leg edema.  Increase coreg to 6.96m bid.  H/o TIA: Continue Aspirin 81 mg daily.  F/u in 3 months     MNigel Mormon MD Pager: [phone]: [phone]

## 2022-07-08 ENCOUNTER — Other Ambulatory Visit (HOSPITAL_COMMUNITY): Payer: Self-pay

## 2022-07-30 DIAGNOSIS — I5030 Unspecified diastolic (congestive) heart failure: Secondary | ICD-10-CM | POA: Diagnosis not present

## 2022-07-30 DIAGNOSIS — I10 Essential (primary) hypertension: Secondary | ICD-10-CM | POA: Diagnosis not present

## 2022-07-30 DIAGNOSIS — E7800 Pure hypercholesterolemia, unspecified: Secondary | ICD-10-CM | POA: Diagnosis not present

## 2022-07-30 DIAGNOSIS — M4807 Spinal stenosis, lumbosacral region: Secondary | ICD-10-CM | POA: Diagnosis not present

## 2022-08-10 DIAGNOSIS — Z7982 Long term (current) use of aspirin: Secondary | ICD-10-CM | POA: Diagnosis not present

## 2022-08-10 DIAGNOSIS — Z9181 History of falling: Secondary | ICD-10-CM | POA: Diagnosis not present

## 2022-08-10 DIAGNOSIS — M4807 Spinal stenosis, lumbosacral region: Secondary | ICD-10-CM | POA: Diagnosis not present

## 2022-08-10 DIAGNOSIS — Z87891 Personal history of nicotine dependence: Secondary | ICD-10-CM | POA: Diagnosis not present

## 2022-08-10 DIAGNOSIS — I5030 Unspecified diastolic (congestive) heart failure: Secondary | ICD-10-CM | POA: Diagnosis not present

## 2022-08-10 DIAGNOSIS — Z8673 Personal history of transient ischemic attack (TIA), and cerebral infarction without residual deficits: Secondary | ICD-10-CM | POA: Diagnosis not present

## 2022-08-10 DIAGNOSIS — G8929 Other chronic pain: Secondary | ICD-10-CM | POA: Diagnosis not present

## 2022-08-10 DIAGNOSIS — M79602 Pain in left arm: Secondary | ICD-10-CM | POA: Diagnosis not present

## 2022-08-10 DIAGNOSIS — I350 Nonrheumatic aortic (valve) stenosis: Secondary | ICD-10-CM | POA: Diagnosis not present

## 2022-08-10 DIAGNOSIS — E7800 Pure hypercholesterolemia, unspecified: Secondary | ICD-10-CM | POA: Diagnosis not present

## 2022-08-10 DIAGNOSIS — R413 Other amnesia: Secondary | ICD-10-CM | POA: Diagnosis not present

## 2022-08-10 DIAGNOSIS — I110 Hypertensive heart disease with heart failure: Secondary | ICD-10-CM | POA: Diagnosis not present

## 2022-08-18 ENCOUNTER — Emergency Department (HOSPITAL_COMMUNITY)
Admission: EM | Admit: 2022-08-18 | Discharge: 2022-08-19 | Discharge status: Against Medical Advice | Payer: Medicare Other | Attending: Emergency Medicine | Admitting: Emergency Medicine

## 2022-08-18 ENCOUNTER — Other Ambulatory Visit: Payer: Self-pay

## 2022-08-18 DIAGNOSIS — R42 Dizziness and giddiness: Secondary | ICD-10-CM | POA: Insufficient documentation

## 2022-08-18 DIAGNOSIS — R55 Syncope and collapse: Secondary | ICD-10-CM | POA: Diagnosis not present

## 2022-08-18 DIAGNOSIS — I350 Nonrheumatic aortic (valve) stenosis: Secondary | ICD-10-CM | POA: Diagnosis not present

## 2022-08-18 DIAGNOSIS — G8929 Other chronic pain: Secondary | ICD-10-CM | POA: Diagnosis not present

## 2022-08-18 DIAGNOSIS — Z5321 Procedure and treatment not carried out due to patient leaving prior to being seen by health care provider: Secondary | ICD-10-CM | POA: Insufficient documentation

## 2022-08-18 DIAGNOSIS — I5030 Unspecified diastolic (congestive) heart failure: Secondary | ICD-10-CM | POA: Diagnosis not present

## 2022-08-18 DIAGNOSIS — R413 Other amnesia: Secondary | ICD-10-CM | POA: Diagnosis not present

## 2022-08-18 DIAGNOSIS — Z87891 Personal history of nicotine dependence: Secondary | ICD-10-CM | POA: Diagnosis not present

## 2022-08-18 DIAGNOSIS — M4807 Spinal stenosis, lumbosacral region: Secondary | ICD-10-CM | POA: Diagnosis not present

## 2022-08-18 DIAGNOSIS — M79602 Pain in left arm: Secondary | ICD-10-CM | POA: Diagnosis not present

## 2022-08-18 DIAGNOSIS — E7800 Pure hypercholesterolemia, unspecified: Secondary | ICD-10-CM | POA: Diagnosis not present

## 2022-08-18 DIAGNOSIS — I110 Hypertensive heart disease with heart failure: Secondary | ICD-10-CM | POA: Diagnosis not present

## 2022-08-18 DIAGNOSIS — R0602 Shortness of breath: Secondary | ICD-10-CM | POA: Diagnosis not present

## 2022-08-18 DIAGNOSIS — R531 Weakness: Secondary | ICD-10-CM | POA: Diagnosis not present

## 2022-08-18 DIAGNOSIS — Z7982 Long term (current) use of aspirin: Secondary | ICD-10-CM | POA: Diagnosis not present

## 2022-08-18 DIAGNOSIS — Z8673 Personal history of transient ischemic attack (TIA), and cerebral infarction without residual deficits: Secondary | ICD-10-CM | POA: Diagnosis not present

## 2022-08-18 DIAGNOSIS — Z9181 History of falling: Secondary | ICD-10-CM | POA: Diagnosis not present

## 2022-08-18 LAB — CBC WITH DIFFERENTIAL/PLATELET
Abs Immature Granulocytes: 0.03 10*3/uL (ref 0.00–0.07)
Basophils Absolute: 0.1 10*3/uL (ref 0.0–0.1)
Basophils Relative: 1 %
Eosinophils Absolute: 0.1 10*3/uL (ref 0.0–0.5)
Eosinophils Relative: 1 %
HCT: 38.8 % — ABNORMAL LOW (ref 39.0–52.0)
Hemoglobin: 12.6 g/dL — ABNORMAL LOW (ref 13.0–17.0)
Immature Granulocytes: 0 %
Lymphocytes Relative: 14 %
Lymphs Abs: 1.3 10*3/uL (ref 0.7–4.0)
MCH: 31.4 pg (ref 26.0–34.0)
MCHC: 32.5 g/dL (ref 30.0–36.0)
MCV: 96.8 fL (ref 80.0–100.0)
Monocytes Absolute: 0.8 10*3/uL (ref 0.1–1.0)
Monocytes Relative: 8 %
Neutro Abs: 7.1 10*3/uL (ref 1.7–7.7)
Neutrophils Relative %: 76 %
Platelets: 203 10*3/uL (ref 150–400)
RBC: 4.01 MIL/uL — ABNORMAL LOW (ref 4.22–5.81)
RDW: 13.1 % (ref 11.5–15.5)
WBC: 9.4 10*3/uL (ref 4.0–10.5)
nRBC: 0 % (ref 0.0–0.2)

## 2022-08-18 LAB — COMPREHENSIVE METABOLIC PANEL
ALT: 22 U/L (ref 0–44)
AST: 18 U/L (ref 15–41)
Albumin: 3.9 g/dL (ref 3.5–5.0)
Alkaline Phosphatase: 155 U/L — ABNORMAL HIGH (ref 38–126)
Anion gap: 11 (ref 5–15)
BUN: 26 mg/dL — ABNORMAL HIGH (ref 8–23)
CO2: 24 mmol/L (ref 22–32)
Calcium: 9.2 mg/dL (ref 8.9–10.3)
Chloride: 106 mmol/L (ref 98–111)
Creatinine, Ser: 1.56 mg/dL — ABNORMAL HIGH (ref 0.61–1.24)
GFR, Estimated: 41 mL/min — ABNORMAL LOW (ref >60)
Glucose, Bld: 150 mg/dL — ABNORMAL HIGH (ref 70–99)
Potassium: 3.8 mmol/L (ref 3.5–5.1)
Sodium: 141 mmol/L (ref 135–145)
Total Bilirubin: 0.8 mg/dL (ref 0.3–1.2)
Total Protein: 7.2 g/dL (ref 6.5–8.1)

## 2022-08-18 LAB — URINALYSIS, ROUTINE W REFLEX MICROSCOPIC
Bacteria, UA: NONE SEEN
Bilirubin Urine: NEGATIVE
Glucose, UA: NEGATIVE mg/dL
Hgb urine dipstick: NEGATIVE
Ketones, ur: 80 mg/dL — AB
Leukocytes,Ua: NEGATIVE
Nitrite: NEGATIVE
Protein, ur: 30 mg/dL — AB
Specific Gravity, Urine: 1.019 (ref 1.005–1.030)
pH: 5 (ref 5.0–8.0)

## 2022-08-18 LAB — TROPONIN I (HIGH SENSITIVITY): Troponin I (High Sensitivity): 19 ng/L — ABNORMAL HIGH (ref <18)

## 2022-08-18 NOTE — ED Provider Triage Note (Signed)
Emergency Medicine Provider Triage Evaluation Note  Marcus Powers , a 88 y.o. male  was evaluated in triage.  Pt complains of syncope and weakness  Review of Systems  Positive: weakness Negative: fever  Physical Exam  BP (!) 128/103   Pulse 90   Temp 98.1 F (36.7 C) (Oral)   Resp 16   SpO2 98%  Gen:   Awake, no distress   Resp:  Normal effort  MSK:   Moves extremities without difficulty  Other:    Medical Decision Making  Medically screening exam initiated at 4:03 PM.  Appropriate orders placed.  Marcus Powers was informed that the remainder of the evaluation will be completed by another provider, this initial triage assessment does not replace that evaluation, and the importance of remaining in the ED until their evaluation is complete.     Marcus Powers, [place] 08/18/22 1605

## 2022-08-18 NOTE — ED Notes (Signed)
Patient placed in recliner for comfort and safety. 

## 2022-08-18 NOTE — ED Triage Notes (Signed)
Pt arrives with family for eval of shortness of breath and dizziness onset this morning. Pt reports symptoms are intermittent and both are worse with exertion. Home health nurse at the house today who gave him a good report.

## 2022-08-19 DIAGNOSIS — I10 Essential (primary) hypertension: Secondary | ICD-10-CM | POA: Diagnosis not present

## 2022-08-19 DIAGNOSIS — I5030 Unspecified diastolic (congestive) heart failure: Secondary | ICD-10-CM | POA: Diagnosis not present

## 2022-08-19 DIAGNOSIS — E7800 Pure hypercholesterolemia, unspecified: Secondary | ICD-10-CM | POA: Diagnosis not present

## 2022-08-19 NOTE — ED Notes (Signed)
Pt becoming agitated. Patient states he wants to go home. Patient son is taking him home.

## 2022-08-19 NOTE — ED Notes (Signed)
Pt and pt son was getting impatient about waiting being they have been here since 2pm yesterday.

## 2022-08-21 DIAGNOSIS — Z9181 History of falling: Secondary | ICD-10-CM | POA: Diagnosis not present

## 2022-08-21 DIAGNOSIS — Z7982 Long term (current) use of aspirin: Secondary | ICD-10-CM | POA: Diagnosis not present

## 2022-08-21 DIAGNOSIS — Z87891 Personal history of nicotine dependence: Secondary | ICD-10-CM | POA: Diagnosis not present

## 2022-08-21 DIAGNOSIS — G8929 Other chronic pain: Secondary | ICD-10-CM | POA: Diagnosis not present

## 2022-08-21 DIAGNOSIS — Z8673 Personal history of transient ischemic attack (TIA), and cerebral infarction without residual deficits: Secondary | ICD-10-CM | POA: Diagnosis not present

## 2022-08-21 DIAGNOSIS — I110 Hypertensive heart disease with heart failure: Secondary | ICD-10-CM | POA: Diagnosis not present

## 2022-08-21 DIAGNOSIS — M4807 Spinal stenosis, lumbosacral region: Secondary | ICD-10-CM | POA: Diagnosis not present

## 2022-08-21 DIAGNOSIS — R413 Other amnesia: Secondary | ICD-10-CM | POA: Diagnosis not present

## 2022-08-21 DIAGNOSIS — I5030 Unspecified diastolic (congestive) heart failure: Secondary | ICD-10-CM | POA: Diagnosis not present

## 2022-08-21 DIAGNOSIS — I350 Nonrheumatic aortic (valve) stenosis: Secondary | ICD-10-CM | POA: Diagnosis not present

## 2022-08-21 DIAGNOSIS — M79602 Pain in left arm: Secondary | ICD-10-CM | POA: Diagnosis not present

## 2022-08-21 DIAGNOSIS — E7800 Pure hypercholesterolemia, unspecified: Secondary | ICD-10-CM | POA: Diagnosis not present

## 2022-08-25 ENCOUNTER — Telehealth: Payer: Self-pay

## 2022-08-25 NOTE — Telephone Encounter (Signed)
     Patient  visit on 11/14  at [facility]   Have you been able to follow up with your primary care physician? NA  The patient was or was not able to obtain any needed medicine or equipment. NA   Are there diet recommendations that you are having difficulty following? NA  Patient expresses understanding of discharge instructions and education provided has no other needs at this time.  Yes   Patient was never seen   Lenard Forth [facility] Guide, [facility], Care Management  [phone] [address] [place], [place], [place] [address] Phone: [phone] Email: [email or web address]

## 2022-08-27 DIAGNOSIS — I110 Hypertensive heart disease with heart failure: Secondary | ICD-10-CM | POA: Diagnosis not present

## 2022-08-27 DIAGNOSIS — E860 Dehydration: Secondary | ICD-10-CM | POA: Diagnosis not present

## 2022-08-27 DIAGNOSIS — G8929 Other chronic pain: Secondary | ICD-10-CM | POA: Diagnosis not present

## 2022-08-27 DIAGNOSIS — Z7982 Long term (current) use of aspirin: Secondary | ICD-10-CM | POA: Diagnosis not present

## 2022-08-27 DIAGNOSIS — I5032 Chronic diastolic (congestive) heart failure: Secondary | ICD-10-CM | POA: Diagnosis not present

## 2022-08-27 DIAGNOSIS — I5030 Unspecified diastolic (congestive) heart failure: Secondary | ICD-10-CM | POA: Diagnosis not present

## 2022-08-27 DIAGNOSIS — Z8673 Personal history of transient ischemic attack (TIA), and cerebral infarction without residual deficits: Secondary | ICD-10-CM | POA: Diagnosis not present

## 2022-08-27 DIAGNOSIS — Z9181 History of falling: Secondary | ICD-10-CM | POA: Diagnosis not present

## 2022-08-27 DIAGNOSIS — M4807 Spinal stenosis, lumbosacral region: Secondary | ICD-10-CM | POA: Diagnosis not present

## 2022-08-27 DIAGNOSIS — Z87891 Personal history of nicotine dependence: Secondary | ICD-10-CM | POA: Diagnosis not present

## 2022-08-27 DIAGNOSIS — E7800 Pure hypercholesterolemia, unspecified: Secondary | ICD-10-CM | POA: Diagnosis not present

## 2022-08-27 DIAGNOSIS — R42 Dizziness and giddiness: Secondary | ICD-10-CM | POA: Diagnosis not present

## 2022-08-27 DIAGNOSIS — R413 Other amnesia: Secondary | ICD-10-CM | POA: Diagnosis not present

## 2022-08-27 DIAGNOSIS — R55 Syncope and collapse: Secondary | ICD-10-CM | POA: Diagnosis not present

## 2022-08-27 DIAGNOSIS — Z952 Presence of prosthetic heart valve: Secondary | ICD-10-CM | POA: Diagnosis not present

## 2022-08-27 DIAGNOSIS — I350 Nonrheumatic aortic (valve) stenosis: Secondary | ICD-10-CM | POA: Diagnosis not present

## 2022-08-27 DIAGNOSIS — M79602 Pain in left arm: Secondary | ICD-10-CM | POA: Diagnosis not present

## 2022-09-03 DIAGNOSIS — E7800 Pure hypercholesterolemia, unspecified: Secondary | ICD-10-CM | POA: Diagnosis not present

## 2022-09-03 DIAGNOSIS — Z9181 History of falling: Secondary | ICD-10-CM | POA: Diagnosis not present

## 2022-09-03 DIAGNOSIS — Z7982 Long term (current) use of aspirin: Secondary | ICD-10-CM | POA: Diagnosis not present

## 2022-09-03 DIAGNOSIS — I110 Hypertensive heart disease with heart failure: Secondary | ICD-10-CM | POA: Diagnosis not present

## 2022-09-03 DIAGNOSIS — G8929 Other chronic pain: Secondary | ICD-10-CM | POA: Diagnosis not present

## 2022-09-03 DIAGNOSIS — M4807 Spinal stenosis, lumbosacral region: Secondary | ICD-10-CM | POA: Diagnosis not present

## 2022-09-03 DIAGNOSIS — Z8673 Personal history of transient ischemic attack (TIA), and cerebral infarction without residual deficits: Secondary | ICD-10-CM | POA: Diagnosis not present

## 2022-09-03 DIAGNOSIS — M79602 Pain in left arm: Secondary | ICD-10-CM | POA: Diagnosis not present

## 2022-09-03 DIAGNOSIS — I5030 Unspecified diastolic (congestive) heart failure: Secondary | ICD-10-CM | POA: Diagnosis not present

## 2022-09-03 DIAGNOSIS — Z87891 Personal history of nicotine dependence: Secondary | ICD-10-CM | POA: Diagnosis not present

## 2022-09-03 DIAGNOSIS — R413 Other amnesia: Secondary | ICD-10-CM | POA: Diagnosis not present

## 2022-09-03 DIAGNOSIS — I350 Nonrheumatic aortic (valve) stenosis: Secondary | ICD-10-CM | POA: Diagnosis not present

## 2022-09-04 DIAGNOSIS — Z8673 Personal history of transient ischemic attack (TIA), and cerebral infarction without residual deficits: Secondary | ICD-10-CM | POA: Diagnosis not present

## 2022-09-04 DIAGNOSIS — Z9181 History of falling: Secondary | ICD-10-CM | POA: Diagnosis not present

## 2022-09-04 DIAGNOSIS — Z87891 Personal history of nicotine dependence: Secondary | ICD-10-CM | POA: Diagnosis not present

## 2022-09-04 DIAGNOSIS — I110 Hypertensive heart disease with heart failure: Secondary | ICD-10-CM | POA: Diagnosis not present

## 2022-09-04 DIAGNOSIS — R413 Other amnesia: Secondary | ICD-10-CM | POA: Diagnosis not present

## 2022-09-04 DIAGNOSIS — I350 Nonrheumatic aortic (valve) stenosis: Secondary | ICD-10-CM | POA: Diagnosis not present

## 2022-09-04 DIAGNOSIS — M4807 Spinal stenosis, lumbosacral region: Secondary | ICD-10-CM | POA: Diagnosis not present

## 2022-09-04 DIAGNOSIS — M79602 Pain in left arm: Secondary | ICD-10-CM | POA: Diagnosis not present

## 2022-09-04 DIAGNOSIS — E7800 Pure hypercholesterolemia, unspecified: Secondary | ICD-10-CM | POA: Diagnosis not present

## 2022-09-04 DIAGNOSIS — Z7982 Long term (current) use of aspirin: Secondary | ICD-10-CM | POA: Diagnosis not present

## 2022-09-04 DIAGNOSIS — G8929 Other chronic pain: Secondary | ICD-10-CM | POA: Diagnosis not present

## 2022-09-04 DIAGNOSIS — I5030 Unspecified diastolic (congestive) heart failure: Secondary | ICD-10-CM | POA: Diagnosis not present

## 2022-09-09 DIAGNOSIS — I5030 Unspecified diastolic (congestive) heart failure: Secondary | ICD-10-CM | POA: Diagnosis not present

## 2022-09-09 DIAGNOSIS — I10 Essential (primary) hypertension: Secondary | ICD-10-CM | POA: Diagnosis not present

## 2022-09-09 DIAGNOSIS — E7800 Pure hypercholesterolemia, unspecified: Secondary | ICD-10-CM | POA: Diagnosis not present

## 2022-09-10 DIAGNOSIS — M79602 Pain in left arm: Secondary | ICD-10-CM | POA: Diagnosis not present

## 2022-09-10 DIAGNOSIS — I110 Hypertensive heart disease with heart failure: Secondary | ICD-10-CM | POA: Diagnosis not present

## 2022-09-10 DIAGNOSIS — I350 Nonrheumatic aortic (valve) stenosis: Secondary | ICD-10-CM | POA: Diagnosis not present

## 2022-09-10 DIAGNOSIS — E7800 Pure hypercholesterolemia, unspecified: Secondary | ICD-10-CM | POA: Diagnosis not present

## 2022-09-10 DIAGNOSIS — Z87891 Personal history of nicotine dependence: Secondary | ICD-10-CM | POA: Diagnosis not present

## 2022-09-10 DIAGNOSIS — I5030 Unspecified diastolic (congestive) heart failure: Secondary | ICD-10-CM | POA: Diagnosis not present

## 2022-09-10 DIAGNOSIS — Z7982 Long term (current) use of aspirin: Secondary | ICD-10-CM | POA: Diagnosis not present

## 2022-09-10 DIAGNOSIS — R413 Other amnesia: Secondary | ICD-10-CM | POA: Diagnosis not present

## 2022-09-10 DIAGNOSIS — M4807 Spinal stenosis, lumbosacral region: Secondary | ICD-10-CM | POA: Diagnosis not present

## 2022-09-10 DIAGNOSIS — G8929 Other chronic pain: Secondary | ICD-10-CM | POA: Diagnosis not present

## 2022-09-10 DIAGNOSIS — Z9181 History of falling: Secondary | ICD-10-CM | POA: Diagnosis not present

## 2022-09-10 DIAGNOSIS — Z8673 Personal history of transient ischemic attack (TIA), and cerebral infarction without residual deficits: Secondary | ICD-10-CM | POA: Diagnosis not present

## 2022-09-15 DIAGNOSIS — M79602 Pain in left arm: Secondary | ICD-10-CM | POA: Diagnosis not present

## 2022-09-15 DIAGNOSIS — Z87891 Personal history of nicotine dependence: Secondary | ICD-10-CM | POA: Diagnosis not present

## 2022-09-15 DIAGNOSIS — I5030 Unspecified diastolic (congestive) heart failure: Secondary | ICD-10-CM | POA: Diagnosis not present

## 2022-09-15 DIAGNOSIS — Z9181 History of falling: Secondary | ICD-10-CM | POA: Diagnosis not present

## 2022-09-15 DIAGNOSIS — I350 Nonrheumatic aortic (valve) stenosis: Secondary | ICD-10-CM | POA: Diagnosis not present

## 2022-09-15 DIAGNOSIS — Z8673 Personal history of transient ischemic attack (TIA), and cerebral infarction without residual deficits: Secondary | ICD-10-CM | POA: Diagnosis not present

## 2022-09-15 DIAGNOSIS — E7800 Pure hypercholesterolemia, unspecified: Secondary | ICD-10-CM | POA: Diagnosis not present

## 2022-09-15 DIAGNOSIS — G8929 Other chronic pain: Secondary | ICD-10-CM | POA: Diagnosis not present

## 2022-09-15 DIAGNOSIS — R413 Other amnesia: Secondary | ICD-10-CM | POA: Diagnosis not present

## 2022-09-15 DIAGNOSIS — Z7982 Long term (current) use of aspirin: Secondary | ICD-10-CM | POA: Diagnosis not present

## 2022-09-15 DIAGNOSIS — M4807 Spinal stenosis, lumbosacral region: Secondary | ICD-10-CM | POA: Diagnosis not present

## 2022-09-15 DIAGNOSIS — I110 Hypertensive heart disease with heart failure: Secondary | ICD-10-CM | POA: Diagnosis not present

## 2022-09-17 DIAGNOSIS — R413 Other amnesia: Secondary | ICD-10-CM | POA: Diagnosis not present

## 2022-09-17 DIAGNOSIS — I110 Hypertensive heart disease with heart failure: Secondary | ICD-10-CM | POA: Diagnosis not present

## 2022-09-17 DIAGNOSIS — E7800 Pure hypercholesterolemia, unspecified: Secondary | ICD-10-CM | POA: Diagnosis not present

## 2022-09-17 DIAGNOSIS — G8929 Other chronic pain: Secondary | ICD-10-CM | POA: Diagnosis not present

## 2022-09-17 DIAGNOSIS — Z8673 Personal history of transient ischemic attack (TIA), and cerebral infarction without residual deficits: Secondary | ICD-10-CM | POA: Diagnosis not present

## 2022-09-17 DIAGNOSIS — Z87891 Personal history of nicotine dependence: Secondary | ICD-10-CM | POA: Diagnosis not present

## 2022-09-17 DIAGNOSIS — M4807 Spinal stenosis, lumbosacral region: Secondary | ICD-10-CM | POA: Diagnosis not present

## 2022-09-17 DIAGNOSIS — M79602 Pain in left arm: Secondary | ICD-10-CM | POA: Diagnosis not present

## 2022-09-17 DIAGNOSIS — I5030 Unspecified diastolic (congestive) heart failure: Secondary | ICD-10-CM | POA: Diagnosis not present

## 2022-09-17 DIAGNOSIS — I350 Nonrheumatic aortic (valve) stenosis: Secondary | ICD-10-CM | POA: Diagnosis not present

## 2022-09-17 DIAGNOSIS — Z9181 History of falling: Secondary | ICD-10-CM | POA: Diagnosis not present

## 2022-09-17 DIAGNOSIS — Z7982 Long term (current) use of aspirin: Secondary | ICD-10-CM | POA: Diagnosis not present

## 2022-09-18 DIAGNOSIS — M21372 Foot drop, left foot: Secondary | ICD-10-CM | POA: Diagnosis not present

## 2022-09-18 DIAGNOSIS — I5032 Chronic diastolic (congestive) heart failure: Secondary | ICD-10-CM | POA: Diagnosis not present

## 2022-09-18 DIAGNOSIS — I10 Essential (primary) hypertension: Secondary | ICD-10-CM | POA: Diagnosis not present

## 2022-09-18 DIAGNOSIS — R040 Epistaxis: Secondary | ICD-10-CM | POA: Diagnosis not present

## 2022-09-22 ENCOUNTER — Ambulatory Visit: Payer: Self-pay | Admitting: Cardiology

## 2022-09-22 DIAGNOSIS — Z7982 Long term (current) use of aspirin: Secondary | ICD-10-CM | POA: Diagnosis not present

## 2022-09-22 DIAGNOSIS — I110 Hypertensive heart disease with heart failure: Secondary | ICD-10-CM | POA: Diagnosis not present

## 2022-09-22 DIAGNOSIS — Z9181 History of falling: Secondary | ICD-10-CM | POA: Diagnosis not present

## 2022-09-22 DIAGNOSIS — R413 Other amnesia: Secondary | ICD-10-CM | POA: Diagnosis not present

## 2022-09-22 DIAGNOSIS — M4807 Spinal stenosis, lumbosacral region: Secondary | ICD-10-CM | POA: Diagnosis not present

## 2022-09-22 DIAGNOSIS — E7800 Pure hypercholesterolemia, unspecified: Secondary | ICD-10-CM | POA: Diagnosis not present

## 2022-09-22 DIAGNOSIS — Z8673 Personal history of transient ischemic attack (TIA), and cerebral infarction without residual deficits: Secondary | ICD-10-CM | POA: Diagnosis not present

## 2022-09-22 DIAGNOSIS — M79602 Pain in left arm: Secondary | ICD-10-CM | POA: Diagnosis not present

## 2022-09-22 DIAGNOSIS — G8929 Other chronic pain: Secondary | ICD-10-CM | POA: Diagnosis not present

## 2022-09-22 DIAGNOSIS — Z87891 Personal history of nicotine dependence: Secondary | ICD-10-CM | POA: Diagnosis not present

## 2022-09-22 DIAGNOSIS — I5030 Unspecified diastolic (congestive) heart failure: Secondary | ICD-10-CM | POA: Diagnosis not present

## 2022-09-22 DIAGNOSIS — I350 Nonrheumatic aortic (valve) stenosis: Secondary | ICD-10-CM | POA: Diagnosis not present

## 2022-09-22 NOTE — Progress Notes (Signed)
Patient referred by Lavone Orn, MD for aortic stenosis  Subjective:   Marcus Powers, male    DOB: 1927/11/03, 88 y.o.   MRN: [id number]   No chief complaint on file.    HPI  88 y.o. African-American male with hypertension, hyperlipidemia, s/p TAVR for severe AS  Patient is here today with his wife. Medications reconciled after talking to patient's daughter over the phone. He is doing well, denies any chest pain, dyspnea.    Current Outpatient Medications:    acetaminophen (TYLENOL) 650 MG CR tablet, Take 650 mg by mouth daily as needed for pain., Disp: , Rfl:    aspirin 81 MG chewable tablet, Chew 1 tablet (81 mg total) by mouth daily., Disp: 90 tablet, Rfl: 3   carvedilol (COREG) 6.25 MG tablet, Take 1 tablet (6.25 mg total) by mouth 2 (two) times daily with a meal., Disp: 180 tablet, Rfl: 3   rosuvastatin (CRESTOR) 20 MG tablet, Take 1 tablet (20 mg total) by mouth daily., Disp: 30 tablet, Rfl: 0   torsemide (DEMADEX) 20 MG tablet, Take 1 tablet (20 mg total) by mouth daily. (Patient not taking: Reported on 06/26/2022), Disp: 30 tablet, Rfl: 0    Cardiovascular and other pertinent studies:  EKG 03/27/2022: Sinus rhythm LVH Supraventricular bigeminy  Echocardiogram 02/26/2022:  1. Left ventricular ejection fraction, by estimation, is 55 to 60%. The  left ventricle has normal function. The left ventricle has no regional  wall motion abnormalities. Left ventricular diastolic parameters are  consistent with Grade I diastolic  dysfunction (impaired relaxation).   2. Right ventricular systolic function is normal. The right ventricular  size is normal. There is normal pulmonary artery systolic pressure. The  estimated right ventricular systolic pressure is 00.9 mmHg.   3. Left atrial size was mild to moderately dilated.   4. The mitral valve is normal in structure. Mild mitral valve  regurgitation.   5. The aortic valve has been repaired/replaced. Aortic valve   regurgitation is not visualized. There is a 26 mm Sapien prosthetic (TAVR)  valve present in the aortic position. Procedure Date: 01/28/2022. Aortic  valve mean gradient measures 8.0 mmHg.  Aortic valve Vmax measures 1.93 m/s. Aortic valve acceleration time  measures 92 msec.   Comparison(s): Prior images reviewed side by side. TAVR gradients are  unchanged.   01/28/2022: 26 mm Sapien 3 TAVR by Dr. Burt Knack  EKG 12/20/2021: Sinus tachycardia 103 bpm  Left atrial enlargement Left ventricular hypertrophy ST-T changes related to LVH  Echocardiogram 12/13/2021: 1. Left ventricular ejection fraction, by estimation, is 50 to 55%. The  left ventricle has low normal function. The left ventricle has no regional  wall motion abnormalities. There is mild concentric left ventricular  hypertrophy. Left ventricular  diastolic parameters are consistent with Grade II diastolic dysfunction  (pseudonormalization).   2. Right ventricular systolic function is normal. The right ventricular  size is normal. There is mildly elevated pulmonary artery systolic  pressure.   3. Left atrial size was moderately dilated.   4. The mitral valve is degenerative. No evidence of mitral valve  regurgitation.   5. Difficult assessment for mitral stenosis, suboptimal valve morpology,  continuity equation valve area deferred in the setting of AI.   6. The aortic valve is calcified. Aortic valve regurgitation is mild to  moderate. Severe aortic valve stenosis. Aortic valve mean gradient  measures 35.0 mmHg. Aortic valve Vmax measures 3.74 m/s. Aortic valve  acceleration time measures 95 msec.  Decreased stroke voluem  index.   7. The inferior vena cava is normal in size with greater than 50%  respiratory variability, suggesting right atrial pressure of 3 mmHg.   RHC, coronary angiography 12/13/2021: LM: Normal LAD: Prox focal 20% stenosis Lcx: Normal RCA: Mid focal 20% stenosis   No obstructive coronary artery  disease   RA: 7 mmHg RV: 44/1 mmHg PA: 34/12 mmHg, mPAP 27 mmHg PCW: 11 mmHg   CO: 4.9 L/min CI: 2.8 L/min/m2   Valvular cardiomyopathy, now compensated at rest NYHA class III   Okay to perform TAVR outpatient. Continue pre-TAVR workup.  Recent labs: 08/18/2022: Glucose 150, BUN/Cr 26/1.56. EGFR 41. Na/K 141/3.8. AlKP 155. Rest of the CMP normal H/H 12/38. MCV 96. Platelets 203  03/29/2022: Glucose 123, BUN/Cr 26/1.38. EGFR 48. Na/K 142/3.7 H/H 11/34. MCV 94. Platelets 152 HbA1C 6.0% Chol 184, TG 95, HDL 71, LDL 94    Review of Systems  Cardiovascular:  Negative for chest pain, dyspnea on exertion, leg swelling, palpitations and syncope.         There were no vitals filed for this visit.   There is no height or weight on file to calculate BMI. There were no vitals filed for this visit.    Objective:   Physical Exam Vitals and nursing note reviewed.  Constitutional:      General: He is not in acute distress. Neck:     Vascular: No JVD.  Cardiovascular:     Rate and Rhythm: Normal rate and regular rhythm.     Pulses: Normal pulses.     Heart sounds: No murmur heard. Pulmonary:     Effort: Pulmonary effort is normal.     Breath sounds: Normal breath sounds. No wheezing or rales.  Musculoskeletal:     Right lower leg: No edema.     Left lower leg: No edema.      No diagnosis found.      Assessment & Recommendations:    88 y.o. African-American male with hypertension, hyperlipidemia, s/p TAVR for severe AS  Aortic stenosis: Resolved s/p TAVR. Reemphasized importance of aspirin 81 mg daily, and amoxicillin prior to any dental procedures.  Hypertension: Avoid spironolactone. Take torsemide only as needed for leg edema.  Increase coreg to 6.49m bid.  H/o TIA: Continue Aspirin 81 mg daily.  F/u in 3 months     MNigel Mormon MD Pager: [phone]: [phone]

## 2022-09-26 DIAGNOSIS — I5030 Unspecified diastolic (congestive) heart failure: Secondary | ICD-10-CM | POA: Diagnosis not present

## 2022-09-26 DIAGNOSIS — E7800 Pure hypercholesterolemia, unspecified: Secondary | ICD-10-CM | POA: Diagnosis not present

## 2022-09-26 DIAGNOSIS — I350 Nonrheumatic aortic (valve) stenosis: Secondary | ICD-10-CM | POA: Diagnosis not present

## 2022-09-26 DIAGNOSIS — G8929 Other chronic pain: Secondary | ICD-10-CM | POA: Diagnosis not present

## 2022-09-26 DIAGNOSIS — Z7982 Long term (current) use of aspirin: Secondary | ICD-10-CM | POA: Diagnosis not present

## 2022-09-26 DIAGNOSIS — I110 Hypertensive heart disease with heart failure: Secondary | ICD-10-CM | POA: Diagnosis not present

## 2022-09-26 DIAGNOSIS — M4807 Spinal stenosis, lumbosacral region: Secondary | ICD-10-CM | POA: Diagnosis not present

## 2022-09-26 DIAGNOSIS — Z8673 Personal history of transient ischemic attack (TIA), and cerebral infarction without residual deficits: Secondary | ICD-10-CM | POA: Diagnosis not present

## 2022-09-26 DIAGNOSIS — R413 Other amnesia: Secondary | ICD-10-CM | POA: Diagnosis not present

## 2022-09-26 DIAGNOSIS — Z9181 History of falling: Secondary | ICD-10-CM | POA: Diagnosis not present

## 2022-09-26 DIAGNOSIS — Z87891 Personal history of nicotine dependence: Secondary | ICD-10-CM | POA: Diagnosis not present

## 2022-09-26 DIAGNOSIS — L03031 Cellulitis of right toe: Secondary | ICD-10-CM | POA: Diagnosis not present

## 2022-09-26 DIAGNOSIS — M79602 Pain in left arm: Secondary | ICD-10-CM | POA: Diagnosis not present

## 2022-10-02 DIAGNOSIS — M4807 Spinal stenosis, lumbosacral region: Secondary | ICD-10-CM | POA: Diagnosis not present

## 2022-10-02 DIAGNOSIS — Z87891 Personal history of nicotine dependence: Secondary | ICD-10-CM | POA: Diagnosis not present

## 2022-10-02 DIAGNOSIS — I110 Hypertensive heart disease with heart failure: Secondary | ICD-10-CM | POA: Diagnosis not present

## 2022-10-02 DIAGNOSIS — I5030 Unspecified diastolic (congestive) heart failure: Secondary | ICD-10-CM | POA: Diagnosis not present

## 2022-10-02 DIAGNOSIS — M79602 Pain in left arm: Secondary | ICD-10-CM | POA: Diagnosis not present

## 2022-10-02 DIAGNOSIS — I350 Nonrheumatic aortic (valve) stenosis: Secondary | ICD-10-CM | POA: Diagnosis not present

## 2022-10-02 DIAGNOSIS — R413 Other amnesia: Secondary | ICD-10-CM | POA: Diagnosis not present

## 2022-10-02 DIAGNOSIS — Z8673 Personal history of transient ischemic attack (TIA), and cerebral infarction without residual deficits: Secondary | ICD-10-CM | POA: Diagnosis not present

## 2022-10-02 DIAGNOSIS — Z9181 History of falling: Secondary | ICD-10-CM | POA: Diagnosis not present

## 2022-10-02 DIAGNOSIS — E7800 Pure hypercholesterolemia, unspecified: Secondary | ICD-10-CM | POA: Diagnosis not present

## 2022-10-02 DIAGNOSIS — Z7982 Long term (current) use of aspirin: Secondary | ICD-10-CM | POA: Diagnosis not present

## 2022-10-02 DIAGNOSIS — G8929 Other chronic pain: Secondary | ICD-10-CM | POA: Diagnosis not present

## 2022-10-07 DIAGNOSIS — I5030 Unspecified diastolic (congestive) heart failure: Secondary | ICD-10-CM | POA: Diagnosis not present

## 2022-10-07 DIAGNOSIS — E7800 Pure hypercholesterolemia, unspecified: Secondary | ICD-10-CM | POA: Diagnosis not present

## 2022-10-07 DIAGNOSIS — Z7982 Long term (current) use of aspirin: Secondary | ICD-10-CM | POA: Diagnosis not present

## 2022-10-07 DIAGNOSIS — I350 Nonrheumatic aortic (valve) stenosis: Secondary | ICD-10-CM | POA: Diagnosis not present

## 2022-10-07 DIAGNOSIS — M4807 Spinal stenosis, lumbosacral region: Secondary | ICD-10-CM | POA: Diagnosis not present

## 2022-10-07 DIAGNOSIS — Z9181 History of falling: Secondary | ICD-10-CM | POA: Diagnosis not present

## 2022-10-07 DIAGNOSIS — I110 Hypertensive heart disease with heart failure: Secondary | ICD-10-CM | POA: Diagnosis not present

## 2022-10-07 DIAGNOSIS — Z8673 Personal history of transient ischemic attack (TIA), and cerebral infarction without residual deficits: Secondary | ICD-10-CM | POA: Diagnosis not present

## 2022-10-07 DIAGNOSIS — R413 Other amnesia: Secondary | ICD-10-CM | POA: Diagnosis not present

## 2022-10-07 DIAGNOSIS — G8929 Other chronic pain: Secondary | ICD-10-CM | POA: Diagnosis not present

## 2022-10-07 DIAGNOSIS — M79602 Pain in left arm: Secondary | ICD-10-CM | POA: Diagnosis not present

## 2022-10-07 DIAGNOSIS — Z87891 Personal history of nicotine dependence: Secondary | ICD-10-CM | POA: Diagnosis not present

## 2022-10-08 DIAGNOSIS — Z8673 Personal history of transient ischemic attack (TIA), and cerebral infarction without residual deficits: Secondary | ICD-10-CM | POA: Diagnosis not present

## 2022-10-08 DIAGNOSIS — I350 Nonrheumatic aortic (valve) stenosis: Secondary | ICD-10-CM | POA: Diagnosis not present

## 2022-10-08 DIAGNOSIS — Z7982 Long term (current) use of aspirin: Secondary | ICD-10-CM | POA: Diagnosis not present

## 2022-10-08 DIAGNOSIS — Z9181 History of falling: Secondary | ICD-10-CM | POA: Diagnosis not present

## 2022-10-08 DIAGNOSIS — E7800 Pure hypercholesterolemia, unspecified: Secondary | ICD-10-CM | POA: Diagnosis not present

## 2022-10-08 DIAGNOSIS — Z87891 Personal history of nicotine dependence: Secondary | ICD-10-CM | POA: Diagnosis not present

## 2022-10-08 DIAGNOSIS — R413 Other amnesia: Secondary | ICD-10-CM | POA: Diagnosis not present

## 2022-10-08 DIAGNOSIS — I110 Hypertensive heart disease with heart failure: Secondary | ICD-10-CM | POA: Diagnosis not present

## 2022-10-08 DIAGNOSIS — I5030 Unspecified diastolic (congestive) heart failure: Secondary | ICD-10-CM | POA: Diagnosis not present

## 2022-10-08 DIAGNOSIS — I10 Essential (primary) hypertension: Secondary | ICD-10-CM | POA: Diagnosis not present

## 2022-10-08 DIAGNOSIS — G8929 Other chronic pain: Secondary | ICD-10-CM | POA: Diagnosis not present

## 2022-10-08 DIAGNOSIS — M79602 Pain in left arm: Secondary | ICD-10-CM | POA: Diagnosis not present

## 2022-10-08 DIAGNOSIS — M4807 Spinal stenosis, lumbosacral region: Secondary | ICD-10-CM | POA: Diagnosis not present

## 2022-11-03 DIAGNOSIS — Z7409 Other reduced mobility: Secondary | ICD-10-CM | POA: Diagnosis not present

## 2022-11-03 DIAGNOSIS — N183 Chronic kidney disease, stage 3 unspecified: Secondary | ICD-10-CM | POA: Diagnosis not present

## 2022-11-03 DIAGNOSIS — I10 Essential (primary) hypertension: Secondary | ICD-10-CM | POA: Diagnosis not present

## 2022-11-03 DIAGNOSIS — M21372 Foot drop, left foot: Secondary | ICD-10-CM | POA: Diagnosis not present

## 2022-11-03 DIAGNOSIS — M4807 Spinal stenosis, lumbosacral region: Secondary | ICD-10-CM | POA: Diagnosis not present

## 2022-11-03 DIAGNOSIS — I350 Nonrheumatic aortic (valve) stenosis: Secondary | ICD-10-CM | POA: Diagnosis not present

## 2022-11-03 DIAGNOSIS — I5030 Unspecified diastolic (congestive) heart failure: Secondary | ICD-10-CM | POA: Diagnosis not present

## 2022-11-03 DIAGNOSIS — R7301 Impaired fasting glucose: Secondary | ICD-10-CM | POA: Diagnosis not present

## 2022-11-03 DIAGNOSIS — Z0000 Encounter for general adult medical examination without abnormal findings: Secondary | ICD-10-CM | POA: Diagnosis not present

## 2022-11-03 DIAGNOSIS — Z23 Encounter for immunization: Secondary | ICD-10-CM | POA: Diagnosis not present

## 2022-11-03 DIAGNOSIS — Z79899 Other long term (current) drug therapy: Secondary | ICD-10-CM | POA: Diagnosis not present

## 2022-11-03 DIAGNOSIS — E7800 Pure hypercholesterolemia, unspecified: Secondary | ICD-10-CM | POA: Diagnosis not present

## 2022-12-09 DIAGNOSIS — M21172 Varus deformity, not elsewhere classified, left ankle: Secondary | ICD-10-CM | POA: Diagnosis not present

## 2022-12-09 DIAGNOSIS — M21372 Foot drop, left foot: Secondary | ICD-10-CM | POA: Diagnosis not present

## 2023-01-14 ENCOUNTER — Ambulatory Visit (HOSPITAL_BASED_OUTPATIENT_CLINIC_OR_DEPARTMENT_OTHER): Payer: Medicare Other

## 2023-01-14 ENCOUNTER — Ambulatory Visit: Payer: Medicare Other | Attending: Physician Assistant | Admitting: Cardiology

## 2023-01-14 VITALS — BP 140/60 | HR 80 | Ht 66.0 in | Wt 140.0 lb

## 2023-01-14 DIAGNOSIS — N1832 Chronic kidney disease, stage 3b: Secondary | ICD-10-CM | POA: Diagnosis not present

## 2023-01-14 DIAGNOSIS — I10 Essential (primary) hypertension: Secondary | ICD-10-CM | POA: Diagnosis not present

## 2023-01-14 DIAGNOSIS — Z952 Presence of prosthetic heart valve: Secondary | ICD-10-CM | POA: Insufficient documentation

## 2023-01-14 DIAGNOSIS — I350 Nonrheumatic aortic (valve) stenosis: Secondary | ICD-10-CM | POA: Diagnosis not present

## 2023-01-14 LAB — ECHOCARDIOGRAM COMPLETE
AR max vel: 1.78 cm2
AV Area VTI: 1.9 cm2
AV Area mean vel: 1.78 cm2
AV Mean grad: 9 mmHg
AV Peak grad: 16 mmHg
Ao pk vel: 2 m/s
Area-P 1/2: 2.51 cm2
S' Lateral: 2.1 cm

## 2023-01-14 NOTE — Patient Instructions (Signed)
Medication Instructions:  Your physician recommends that you continue on your current medications as directed. Please refer to the Current Medication list given to you today.  *If you need a refill on your cardiac medications before your next appointment, please call your pharmacy*   Lab Work: NONE If you have labs (blood work) drawn today and your tests are completely normal, you will receive your results only by: MyChart Message (if you have MyChart) OR A paper copy in the mail If you have any lab test that is abnormal or we need to change your treatment, we will call you to review the results.   Testing/Procedures: NONE   Follow-Up: At [facility], you and your health needs are our priority.  As part of our continuing mission to provide you with exceptional heart care, we have created designated Provider Care Teams.  These Care Teams include your primary Cardiologist (physician) and Advanced Practice Providers (APPs -  Physician Assistants and Nurse Practitioners) who all work together to provide you with the care you need, when you need it.  We recommend signing up for the patient portal called "MyChart".  Sign up information is provided on this After Visit Summary.  MyChart is used to connect with patients for Virtual Visits (Telemedicine).  Patients are able to view lab/test results, encounter notes, upcoming appointments, etc.  Non-urgent messages can be sent to your provider as well.   To learn more about what you can do with MyChart, go to [email or web address].    Your next appointment:   KEEP SCHEDULED FOLLOW-UP 

## 2023-01-14 NOTE — Progress Notes (Unsigned)
[facility]   MULTIDISCIPLINARY HEART VALVE CLINIC                                     Cardiology Office Note:    Date:  01/15/2023   ID:  Marcus Powers, DOB 26-Apr-1928, MRN [id number]  PCP:  Marcus Funk, MD  [facility] Cardiologist:  Marcus Negus, MD /Dr. Clifton Powers & Dr. Laneta Powers (TAVR)  [facility] Electrophysiologist:  None   Referring MD: Marcus Funk, MD   Chief Complaint  Patient presents with   Follow-up    1 year s/p TAVR   History of Present Illness:    Marcus Powers is a 89 y.o. male with a hx of HTN, HLD, HFpEF with recurrent admissions and ER visits and severe aortic stenosis with moderate AI s/p TAVR (01/28/22) who presents to clinic for follow up.    Mr. Marcus Powers was admitted to the hospital in 02/2021 with shortness of breath and found to have a BNP greater than 800.  An echocardiogram at that time show severe aortic stenosis with a mean gradient of 47.2 mmHg and a valve area of 0.9 cm by VTI. Ejection fraction was 50 to 55% with mild mitral regurgitation. He was treated with intravenous Lasix with symptom resolution.  He was seen by Dr. Rosemary Powers in 04/2021 and was doing well and still mowing his yard and doing things around the house without shortness of breath or chest discomfort. There was apparently some discussion about referring him for TAVR work-up but he was reluctant. Plans were made for repeat echocardiogram in a few months.  He had an echocardiogram in 07/2021 showing a trileaflet aortic valve with a mean gradient of 38 mmHg and a valve area of 0.6 cm with a dimensionless index of 0.17 consistent with severe aortic stenosis.  There was moderate aortic insufficiency and moderate mitral regurgitation.  Left ventricular ejection fraction was 55% with moderate concentric LVH.  He continued to feel well and did not want to consider TAVR at that time because he was asymptomatic.     He had several readmissions for acute CHF. Most recent echo  12/13/21 showed EF 50-55%, G2DD, mean gradient at 35.0 mmHg, peak gradient 56.0 mmHg, and AVA 0.31 cm2, DVI: 0.22, SVI: 18, mild to mod AI. L/RHC with mild non obst CAD. CT did show abdominal aortic dissection. He was most recently admitted 4/18-4/20/23. He was treated with IV lasix and Lasix switched to torsemide. He was seen by Dr. Laneta Powers during this admission and plans were made to bring him back for TAVR 01/28/22.   He underwent successful TAVR with a 26 mm Edwards Sapien 3 Ultra Resilia THV via the TF approach on 01/28/22. Post operative echo showed EF 55%, moderate LVH, normally functioning TAVR with a mean gradient of 9 mmHg and no PVL. He was discharged on POD1 on aspirin alone.   In follow up with our team and Dr. Rosemary Powers, he seemed to be doing well with no issues. He is here today with his son and wife. He is in a wheelchair however this is more for stability. He is rather sedentary at home but will start mowing his grass with spring. This is a riding mower. His fluid has been stable and is actually off diuretics right now. No chest pain, SOB, LE edema, orthopnea, dizziness, bleeding, or syncope.   Past Medical History:  Diagnosis Date  Chronic diastolic heart failure    Hyperlipidemia    Hypertension    S/P TAVR (transcatheter aortic valve replacement) 01/28/2022   s/p TAVR with a 26 mm Edwards S3UR via the TF approach by Dr. Clifton JamesMcAlhany & Dr. Laneta SimmersBartle   Severe aortic stenosis    Spinal stenosis     Past Surgical History:  Procedure Laterality Date   BIOPSY BREAST     EYE SURGERY Bilateral    RIGHT HEART CATH AND CORONARY ANGIOGRAPHY N/A 12/13/2021   Procedure: RIGHT HEART CATH AND CORONARY ANGIOGRAPHY;  Surgeon: Marcus NegusPatwardhan, Marcus J, MD;  Location: [facility] INVASIVE CV LAB;  Service: Cardiovascular;  Laterality: N/A;   TEE WITHOUT CARDIOVERSION N/A 01/28/2022   Procedure: TRANSESOPHAGEAL ECHOCARDIOGRAM (TEE);  Surgeon: Kathleene HazelMcAlhany, Marcus D, MD;  [facility];  Service: Open Heart  Surgery;  Laterality: N/A;   TONSILLECTOMY     TRANSCATHETER AORTIC VALVE REPLACEMENT, TRANSFEMORAL N/A 01/28/2022   Procedure: Transcatheter Aortic Valve Replacement, Transfemoral;  Surgeon: Kathleene HazelMcAlhany, Marcus D, MD;  [facility];  Service: Open Heart Surgery;  Laterality: N/A;   ULTRASOUND GUIDANCE FOR VASCULAR ACCESS Bilateral 01/28/2022   Procedure: ULTRASOUND GUIDANCE FOR VASCULAR ACCESS;  Surgeon: Kathleene HazelMcAlhany, Marcus D, MD;  [facility];  Service: Open Heart Surgery;  Laterality: Bilateral;    Current Medications: Current Meds  Medication Sig   acetaminophen (TYLENOL) 650 MG CR tablet Take 650 mg by mouth daily as needed for pain.   aspirin 81 MG chewable tablet Chew 1 tablet (81 mg total) by mouth daily.   carvedilol (COREG) 6.25 MG tablet Take 1 tablet (6.25 mg total) by mouth 2 (two) times daily with a meal.   rosuvastatin (CRESTOR) 20 MG tablet Take 1 tablet (20 mg total) by mouth daily.     Allergies:   Benazepril hcl   Social History   Socioeconomic History   Marital status: Married    Spouse name: Not on file   Number of children: 1   Years of education: Not on file   Highest education level: Not on file  Occupational History   Not on file  Tobacco Use   Smoking status: Former    Packs/day: 0.25    Years: 5.00    Additional pack years: 0.00    Total pack years: 1.25    Types: Cigarettes    Quit date: 641969    Years since quitting: 55.3   Smokeless tobacco: Never  Vaping Use   Vaping Use: Never used  Substance and Sexual Activity   Alcohol use: No   Drug use: No   Sexual activity: Not on file  Other Topics Concern   Not on file  Social History Narrative   Not on file   Social Determinants of Health   Financial Resource Strain: Not on file  Food Insecurity: Not on file  Transportation Needs: Not on file  Physical Activity: Not on file  Stress: Not on file  Social Connections: Not on file     Family History: The patient's family history  includes Cancer in his mother; Stroke in his father.  ROS:   Please see the history of present illness.    All other systems reviewed and are negative.  EKGs/Labs/Other Studies Reviewed:    The following studies were reviewed today:   Echo 01/14/23:   1. Left ventricular ejection fraction, by estimation, is 55 to 60%. The  left ventricle has normal function. The left ventricle has no regional  wall motion abnormalities. There is moderate concentric  left ventricular  hypertrophy. Left ventricular  diastolic parameters are consistent with Grade I diastolic dysfunction  (impaired relaxation).   2. Right ventricular systolic function is normal. The right ventricular  size is normal. There is normal pulmonary artery systolic pressure. The  estimated right ventricular systolic pressure is 24.7 mmHg.   3. Left atrial size was mildly dilated.   4. The mitral valve is normal in structure. Trivial mitral valve  regurgitation. No evidence of mitral stenosis.   5. S/p TAVR with 26 mm Edwards Sapien THV. No significant perivalvular  leakage. Mean gradient 9 mmHg with EOA 1.9 cm^2.   6. The inferior vena cava is normal in size with greater than 50%  respiratory variability, suggesting right atrial pressure of 3 mmHg.    TAVR OPERATIVE NOTE     Date of Procedure:                01/28/2022   Preoperative Diagnosis:      Severe Aortic Stenosis    Postoperative Diagnosis:    Same    Procedure:        Transcatheter Aortic Valve Replacement - Percutaneous Left Transfemoral Approach             Edwards Sapien 3 Ultra Resilia THV (size 26 mm, model # 9755RSL, serial # [id number])              Co-Surgeons:                        Alleen Borne, MD and Verne Carrow, MD     Anesthesiologist:                  Aleene Davidson, MD   Echocardiographer:              Merlyn Lot, MD.   Pre-operative Echo Findings: Severe aortic stenosis Normal left ventricular systolic function    Post-operative Echo Findings: Trivial. paravalvular leak Normal left ventricular systolic function   _____________     Echo 01/29/22: IMPRESSIONS  1. The aortic valve has been replaced by a 26 mm Edwards Sapien valve. No central or paravalvular leak. Effective orifice area, by VTI measures 1.67  cm. Aortic valve mean gradient measures 9.0 mmHg. Peak Gradient 14 mm Hg.  DVI 0.56.   2. Left ventricular ejection fraction, by estimation, is 55%. The left  ventricle has normal function. The left ventricle has no regional wall  motion abnormalities. There is moderate concentric left ventricular  hypertrophy. Left ventricular diastolic  parameters are consistent with Grade II diastolic dysfunction  (pseudonormalization).   3. Right ventricular systolic function is hyperdynamic. The right  ventricular size is normal.   4. Left atrial size was moderately dilated.   5. The mitral valve is abnormal. Mild mitral valve regurgitation. The  mean mitral valve gradient is 3.0 mmHg with average heart rate of 98 bpm.   Comparison(s): Slightly increased gradients, LV is more vigorous from  prior.    _____________________   Echo 02/26/22 IMPRESSIONS  1. Left ventricular ejection fraction, by estimation, is 55 to 60%. The left ventricle has normal function. The left ventricle has no regional wall motion abnormalities. Left ventricular diastolic parameters are consistent with Grade I diastolic  dysfunction (impaired relaxation).  2. Right ventricular systolic function is normal. The right ventricular size is normal. There is normal pulmonary artery systolic pressure. The estimated right ventricular systolic pressure is 25.3 mmHg.  3. Left atrial size was mild  to moderately dilated.  4. The mitral valve is normal in structure. Mild mitral valve regurgitation.  5. The aortic valve has been repaired/replaced. Aortic valve regurgitation is not visualized. There is a 26 mm Sapien prosthetic (TAVR) valve  present in the aortic position. Procedure Date: 01/28/2022. Aortic valve mean gradient measures 8.0 mmHg.  Aortic valve Vmax measures 1.93 m/s. Aortic valve acceleration time measures 92 msec.   Comparison(s): Prior images reviewed side by side. TAVR gradients are unchanged.   EKG:  EKG is not ordered today.    Recent Labs: 01/22/2022: B Natriuretic Peptide 799.1 03/29/2022: Magnesium 2.1 08/18/2022: ALT 22; BUN 26; Creatinine, Ser 1.56; Hemoglobin 12.6; Platelets 203; Potassium 3.8; Sodium 141   Recent Lipid Panel    Component Value Date/Time   CHOL 184 03/28/2022 0438   TRIG 95 03/28/2022 0438   HDL 71 03/28/2022 0438   CHOLHDL 2.6 03/28/2022 0438   VLDL 19 03/28/2022 0438   LDLCALC 94 03/28/2022 0438   Physical Exam:    VS:  BP (!) 140/60   Pulse 80   Ht 5\' 6"  (1.676 m)   Wt 140 lb (63.5 kg)   SpO2 97%   BMI 22.60 kg/m     Wt Readings from Last 3 Encounters:  01/14/23 140 lb (63.5 kg)  06/26/22 145 lb (65.8 kg)  04/22/22 146 lb 8 oz (66.5 kg)   General: Well developed, well nourished, NAD Lungs:Clear to ausculation bilaterally. No wheezes, rales, or rhonchi. Breathing is unlabored. Cardiovascular: RRR with S1 S2. + flow murmur Extremities: No edema. Neuro: Alert and oriented. No focal deficits. No facial asymmetry. MAE spontaneously. Psych: Responds to questions appropriately with normal affect.    ASSESSMENT/PLAN:    Severe AS s/p TAVR: Patient doing well with NYHA class I symptoms. Echo with normal LV function with 26mm #3UR, mean gradient with AVA by VTI 1.9cm2. SBE prophylaxis discussed; he has amoxicillin. Continue on ASA 81 mg daily. Plan for regular follow up with Dr. Rosemary Powers.    Chronic diastolic CHF: Appears euvolemic. No longer on diuretics.    CKD stage IIIb: Stable with a baseline Cr 1.2-1.6.   HTN: BP well controlled today. No changes made.    Medication Adjustments/Labs and Tests Ordered: Current medicines are reviewed at length with the  patient today.  Concerns regarding medicines are outlined above.  No orders of the defined types were placed in this encounter.  No orders of the defined types were placed in this encounter.   Patient Instructions  Medication Instructions:  Your physician recommends that you continue on your current medications as directed. Please refer to the Current Medication list given to you today.  *If you need a refill on your cardiac medications before your next appointment, please call your pharmacy*   Lab Work: NONE If you have labs (blood work) drawn today and your tests are completely normal, you will receive your results only by: MyChart Message (if you have MyChart) OR A paper copy in the mail If you have any lab test that is abnormal or we need to change your treatment, we will call you to review the results.   Testing/Procedures: NONE   Follow-Up: At [facility], you and your health needs are our priority.  As part of our continuing mission to provide you with exceptional heart care, we have created designated Provider Care Teams.  These Care Teams include your primary Cardiologist (physician) and Advanced Practice Providers (APPs -  Physician Assistants and Nurse Practitioners) who  all work together to provide you with the care you need, when you need it.  We recommend signing up for the patient portal called "MyChart".  Sign up information is provided on this After Visit Summary.  MyChart is used to connect with patients for Virtual Visits (Telemedicine).  Patients are able to view lab/test results, encounter notes, upcoming appointments, etc.  Non-urgent messages can be sent to your provider as well.   To learn more about what you can do with MyChart, go to [email or web address].    Your next appointment:   KEEP SCHEDULED FOLLOW-UP   Signed, Georgie Chard, NP  01/15/2023 10:20 AM    [facility]

## 2023-02-07 ENCOUNTER — Emergency Department (HOSPITAL_BASED_OUTPATIENT_CLINIC_OR_DEPARTMENT_OTHER)
Admission: EM | Admit: 2023-02-07 | Discharge: 2023-02-07 | Disposition: A | Discharge status: Home / Self Care | Payer: Medicare Other | Attending: Emergency Medicine | Admitting: Emergency Medicine

## 2023-02-07 ENCOUNTER — Encounter (HOSPITAL_BASED_OUTPATIENT_CLINIC_OR_DEPARTMENT_OTHER): Payer: Self-pay | Admitting: Emergency Medicine

## 2023-02-07 DIAGNOSIS — I10 Essential (primary) hypertension: Secondary | ICD-10-CM | POA: Insufficient documentation

## 2023-02-07 DIAGNOSIS — M79605 Pain in left leg: Secondary | ICD-10-CM

## 2023-02-07 DIAGNOSIS — Z79899 Other long term (current) drug therapy: Secondary | ICD-10-CM | POA: Insufficient documentation

## 2023-02-07 DIAGNOSIS — Z7982 Long term (current) use of aspirin: Secondary | ICD-10-CM | POA: Insufficient documentation

## 2023-02-07 LAB — BASIC METABOLIC PANEL
Anion gap: 7 (ref 5–15)
BUN: 13 mg/dL (ref 8–23)
CO2: 25 mmol/L (ref 22–32)
Calcium: 9.3 mg/dL (ref 8.9–10.3)
Chloride: 108 mmol/L (ref 98–111)
Creatinine, Ser: 1.03 mg/dL (ref 0.61–1.24)
GFR, Estimated: >60 mL/min (ref >60)
Glucose, Bld: 98 mg/dL (ref 70–99)
Potassium: 3.9 mmol/L (ref 3.5–5.1)
Sodium: 140 mmol/L (ref 135–145)

## 2023-02-07 LAB — CK: Total CK: 46 U/L — ABNORMAL LOW (ref 49–397)

## 2023-02-07 NOTE — ED Triage Notes (Addendum)
Pt presents to ED POV. Pt c/o L leg pain that began this morning. Pt reports pain is gone since daughter gave him something for pain, unsure of what it was. Pt denies injury/insult. Reports ambulating with walker at baseline. No swelling, redness, or injury noted.

## 2023-02-07 NOTE — Discharge Instructions (Addendum)
You came to the emergency department with pain to your left lower extremity.  Your lab work is reassuring, there are no abnormalities.  You may use Tylenol for your discomfort.  Do not take ibuprofen or Aleve if you are already on a daily aspirin.  If your pain comes back feel free to return to the nearest emergency department.  With any nonemergent concerns you may follow-up with your PCP.  It was a pleasure to meet you and we hope you feel better!

## 2023-02-07 NOTE — ED Provider Notes (Signed)
[place] EMERGENCY DEPARTMENT AT [facility] Provider Note   CSN: [id number] Arrival date & time: 02/07/23  1159     History  Chief Complaint  Patient presents with   Leg Pain    Marcus Powers is a 89 y.o. male with a past medical history of hypertension, aortic stenosis status post TAVR and TIA presenting today with left leg pain.  He reports that he has had pain over the past couple of days it has been very mild however this morning he woke up with more severe pain that he attributes to the weather.  Reports that his daughter gave him medications that helped him feel better.  I asked his daughter what he was given and she said he was only given his daily aspirin.  No numbness or tingling.  No leg swelling, recent travel, recent surgery, history of DVT/PE or falls.  Denies any back pain, fevers or chills, IVDU, urinary retention or incontinence but does report that he had some diarrhea earlier this week.  He reports that he drank prune juice and subsequently had diarrhea.  I ran this by his daughter who corroborates this but says that he also had some seafood that likely did not agree with him.  Currently asymptomatic from the standpoint of his left leg pain Leg Pain      Home Medications Prior to Admission medications   Medication Sig Start Date End Date Taking? Authorizing Provider  acetaminophen (TYLENOL) 650 MG CR tablet Take 650 mg by mouth daily as needed for pain.    [provider]  aspirin 81 MG chewable tablet Chew 1 tablet (81 mg total) by mouth daily. 03/21/22   Patwardhan, Anabel Bene, MD  carvedilol (COREG) 6.25 MG tablet Take 1 tablet (6.25 mg total) by mouth 2 (two) times daily with a meal. 06/26/22   Patwardhan, Manish J, MD  rosuvastatin (CRESTOR) 20 MG tablet Take 1 tablet (20 mg total) by mouth daily. 03/29/22   Leroy Sea, MD  torsemide (DEMADEX) 20 MG tablet Take 1 tablet (20 mg total) by mouth daily. Patient not taking: Reported on 06/26/2022  03/29/22   Leroy Sea, MD      Allergies    Benazepril hcl    Review of Systems   Review of Systems  Physical Exam Updated Vital Signs BP (!) 147/89   Pulse 72   Temp 98.5 F (36.9 C) (Oral)   Resp 16   SpO2 99%  Physical Exam Vitals and nursing note reviewed.  Constitutional:      Appearance: Normal appearance.  HENT:     Head: Normocephalic and atraumatic.  Eyes:     General: No scleral icterus.    Conjunctiva/sclera: Conjunctivae normal.  Pulmonary:     Effort: Pulmonary effort is normal. No respiratory distress.  Musculoskeletal:     Comments: Full range of motion of bilateral lower extremities.  Normal strength to ankle plantarflexion.  Strong DP pulse.  No ulcerations or deformities noted.  No swelling in either lower extremity.  No reproducible tenderness on physical exam.  Negative straight leg.  No midline lumbar spinal tenderness nor tenderness to the patient's paraspinal muscles or gluteal muscles.  No overlying cellulitis.  Skin:    Findings: No rash.  Neurological:     Mental Status: He is alert.  Psychiatric:        Mood and Affect: Mood normal.     ED Results / Procedures / Treatments   Labs (all labs ordered are listed,  but only abnormal results are displayed) Labs Reviewed - No data to display  EKG None  Radiology No results found.  Procedures Procedures   Medications Ordered in ED Medications - No data to display  ED Course/ Medical Decision Making/ A&P                             Medical Decision Making Amount and/or Complexity of Data Reviewed Labs: ordered.   88 year old presenting today with left leg pain.  Differential includes but is not limited to DVT, cellulitis, fluid overload from CHF, peripheral edema, fracture, dislocation.  Physical exam: Neurovascularly intact with normal strength at all joints of the extremity.  No appreciable swelling, no cellulitis  Treatment: None being that patient is currently  asymptomatic  Workup: Negative labs  MDM/disposition: 89 year old male who presented today with left leg pain that resolved prior to arrival.  Appears to have resolved with aspirin that he was given.  Reports that he has been having trouble with his left lower extremity for the past couple days however this morning he woke up and it was worse.  He says that it often gets triggered by rainy weather.  He had no DVT risk factors so Doppler was not ordered.  Physical exam revealed absence of edema or cellulitis.  He had no back pain on physical exam.  No red flags for back pain either.  He did mention diarrhea however this appears to be more in the context of prune juice.  Seen by my attending physician who spoke with the daughter who was concerned about patient's statin.  CK was checked and was within normal limits.  Patient has been reasonably screened for emergent conditions at this time.  He was encouraged to follow-up with PCP as needed for chronic treatment of his leg pain however strict return precautions were given and he was encouraged to return to the nearest emergency department with any recurring or worsening symptoms.   Final Clinical Impression(s) / ED Diagnoses Final diagnoses:  Left leg pain    Rx / DC Orders ED Discharge Orders     None      Results and diagnoses were explained to the patient. Return precautions discussed in full. Patient had no additional questions and expressed complete understanding.   This chart was dictated using voice recognition software.  Despite best efforts to proofread,  errors can occur which can change the documentation meaning.    Woodroe Chen 02/07/23 1423    Glynn Octave, MD 02/07/23 1513

## 2023-02-07 NOTE — ED Notes (Signed)
Dc instructions reviewed with patient. Patient voiced understanding. Dc with belongings.  °

## 2023-03-03 DIAGNOSIS — I10 Essential (primary) hypertension: Secondary | ICD-10-CM | POA: Diagnosis not present

## 2023-03-03 DIAGNOSIS — I5030 Unspecified diastolic (congestive) heart failure: Secondary | ICD-10-CM | POA: Diagnosis not present

## 2023-03-03 DIAGNOSIS — E7800 Pure hypercholesterolemia, unspecified: Secondary | ICD-10-CM | POA: Diagnosis not present

## 2023-03-03 DIAGNOSIS — N183 Chronic kidney disease, stage 3 unspecified: Secondary | ICD-10-CM | POA: Diagnosis not present

## 2023-03-23 DIAGNOSIS — I130 Hypertensive heart and chronic kidney disease with heart failure and stage 1 through stage 4 chronic kidney disease, or unspecified chronic kidney disease: Secondary | ICD-10-CM | POA: Diagnosis not present

## 2023-03-23 DIAGNOSIS — E1122 Type 2 diabetes mellitus with diabetic chronic kidney disease: Secondary | ICD-10-CM | POA: Diagnosis not present

## 2023-03-23 DIAGNOSIS — I5032 Chronic diastolic (congestive) heart failure: Secondary | ICD-10-CM | POA: Diagnosis not present

## 2023-03-23 DIAGNOSIS — E7800 Pure hypercholesterolemia, unspecified: Secondary | ICD-10-CM | POA: Diagnosis not present

## 2023-03-23 DIAGNOSIS — I350 Nonrheumatic aortic (valve) stenosis: Secondary | ICD-10-CM | POA: Diagnosis not present

## 2023-03-23 DIAGNOSIS — Z7409 Other reduced mobility: Secondary | ICD-10-CM | POA: Diagnosis not present

## 2023-03-23 DIAGNOSIS — N1832 Chronic kidney disease, stage 3b: Secondary | ICD-10-CM | POA: Diagnosis not present

## 2023-03-23 DIAGNOSIS — M4807 Spinal stenosis, lumbosacral region: Secondary | ICD-10-CM | POA: Diagnosis not present

## 2023-03-23 DIAGNOSIS — M21372 Foot drop, left foot: Secondary | ICD-10-CM | POA: Diagnosis not present

## 2023-04-20 ENCOUNTER — Ambulatory Visit: Payer: Medicare Other | Admitting: Podiatry

## 2023-04-20 ENCOUNTER — Encounter: Payer: Self-pay | Admitting: Podiatry

## 2023-04-20 DIAGNOSIS — M79675 Pain in left toe(s): Secondary | ICD-10-CM

## 2023-04-20 DIAGNOSIS — M79674 Pain in right toe(s): Secondary | ICD-10-CM

## 2023-04-20 DIAGNOSIS — B351 Tinea unguium: Secondary | ICD-10-CM

## 2023-04-20 NOTE — Progress Notes (Signed)
This patient presents to the office with chief complaint of long thick painful nails.  Patient says the nails are painful walking and wearing shoes.  This patient is unable to self treat.  This patient is unable to trim his nails since she is unable to reach his nails.  She presents to the office for preventative foot care services.  General Appearance  Alert, conversant and in no acute stress.  Vascular  Dorsalis pedis and posterior tibial  pulses are  weakly palpable  bilaterally.  Capillary return is within normal limits  bilaterally. Temperature is within normal limits  bilaterally.  Neurologic  Senn-Weinstein monofilament wire test within normal limits  bilaterally. Muscle power within normal limits bilaterally.  Nails Thick disfigured discolored nails with subungual debris  from hallux to fifth toes bilaterally. No evidence of bacterial infection or drainage bilaterally.  Orthopedic  No limitations of motion  feet .  No crepitus or effusions noted.  No bony pathology or digital deformities noted.  Skin  normotropic skin with no porokeratosis noted bilaterally.  No signs of infections or ulcers noted.     Onychomycosis  Nails  B/L.  Pain in right toes  Pain in left toes  Debridement of nails both feet followed trimming the nails with dremel tool.    RTC 3 months.   Helane Gunther DPM

## 2023-04-23 ENCOUNTER — Encounter: Payer: Self-pay | Admitting: Neurology

## 2023-04-23 ENCOUNTER — Ambulatory Visit: Payer: Medicare Other | Admitting: Neurology

## 2023-04-23 VITALS — BP 115/86 | HR 98 | Ht 65.0 in | Wt 140.0 lb

## 2023-04-23 DIAGNOSIS — G459 Transient cerebral ischemic attack, unspecified: Secondary | ICD-10-CM | POA: Diagnosis not present

## 2023-04-23 DIAGNOSIS — R269 Unspecified abnormalities of gait and mobility: Secondary | ICD-10-CM

## 2023-04-23 NOTE — Patient Instructions (Signed)
Continue current medications  Follow up as needed

## 2023-04-23 NOTE — Progress Notes (Signed)
[facility]  PATIENT: Marcus Powers DOB: [birth year]  REQUESTING CLINICIAN: Kirby Funk, MD HISTORY FROM: Patient and son in law REASON FOR VISIT: TIA Follow up    HISTORICAL  CHIEF COMPLAINT:  Chief Complaint  Patient presents with   Follow-up    Rm 13. Ian Bushman, son in law is with him today. States patient has been good since last visit.    INTERVAL HISTORY 04/23/2023 Patient presents today for follow-up, he is accompanied by his son in law.  Last visit was a year ago, since then he has been doing well.  He is compliant with his medications including aspirin and Crestor.  Denies any recent fall, denies any worsening of his memory, no hallucinations.  Sleep is fine, appetite is also fine.  He does walk using a walker, no other complaint, no other concerns.   HISTORY OF PRESENT ILLNESS:  This is a 89 year old male with past medical history of hypertension, hyperlipidemia, severe aortic stenosis status post TAVR who is presenting after being admitted to hospital on June 22 for slurred speech.  Per son-in-law, patient experienced slurred speech at home lasting about 30 minutes.  He initially gave him 1 dose of aspirin but his symptoms did not improve therefore EMS was called.  In the ED, he did have full stroke work-up including MRI and MRA which was negative for any acute stroke and no large vessel occlusion.  His symptoms improved and his speech was back to normal while he was in the ED.  On discharge, he was recommended aspirin and Plavix for a total of 21 days and to continue with aspirin thereafter.  He is compliant with his medications.  His LDL was elevated, therefore his Pravachol was changed to Crestor and Zetia added. Denies any worsening speech since being home.   OTHER MEDICAL CONDITIONS: Hypertension, Hyperlipidemia, AS s/p TAVR, CKD stage 3   REVIEW OF SYSTEMS: Full 14 system review of systems performed and negative with exception of: As noted in the HPI.    ALLERGIES: Allergies  Allergen Reactions   Benazepril Hcl Cough    HOME MEDICATIONS: Outpatient Medications Prior to Visit  Medication Sig Dispense Refill   acetaminophen (TYLENOL) 650 MG CR tablet Take 650 mg by mouth daily as needed for pain.     aspirin 81 MG chewable tablet Chew 1 tablet (81 mg total) by mouth daily. 90 tablet 3   carvedilol (COREG) 6.25 MG tablet Take 1 tablet (6.25 mg total) by mouth 2 (two) times daily with a meal. 180 tablet 3   rosuvastatin (CRESTOR) 20 MG tablet Take 1 tablet (20 mg total) by mouth daily. 30 tablet 0   torsemide (DEMADEX) 20 MG tablet Take 1 tablet (20 mg total) by mouth daily. 30 tablet 0   No facility-administered medications prior to visit.    PAST MEDICAL HISTORY: Past Medical History:  Diagnosis Date   Chronic diastolic heart failure (HCC)    Hyperlipidemia    Hypertension    S/P TAVR (transcatheter aortic valve replacement) 01/28/2022   s/p TAVR with a 26 mm Edwards S3UR via the TF approach by Dr. Clifton James & Dr. Laneta Simmers   Severe aortic stenosis    Spinal stenosis     PAST SURGICAL HISTORY: Past Surgical History:  Procedure Laterality Date   BIOPSY BREAST     EYE SURGERY Bilateral    RIGHT HEART CATH AND CORONARY ANGIOGRAPHY N/A 12/13/2021   Procedure: RIGHT HEART CATH AND CORONARY ANGIOGRAPHY;  Surgeon: Elder Negus, MD;  Location: [facility];  Service: Cardiovascular;  Laterality: N/A;   TEE WITHOUT CARDIOVERSION N/A 01/28/2022   Procedure: TRANSESOPHAGEAL ECHOCARDIOGRAM (TEE);  Surgeon: Kathleene Hazel, MD;  Location: [facility] OR;  Service: Open Heart Surgery;  Laterality: N/A;   TONSILLECTOMY     TRANSCATHETER AORTIC VALVE REPLACEMENT, TRANSFEMORAL N/A 01/28/2022   Procedure: Transcatheter Aortic Valve Replacement, Transfemoral;  Surgeon: Kathleene Hazel, MD;  Location: [facility] OR;  Service: Open Heart Surgery;  Laterality: N/A;   ULTRASOUND GUIDANCE FOR VASCULAR ACCESS Bilateral 01/28/2022    Procedure: ULTRASOUND GUIDANCE FOR VASCULAR ACCESS;  Surgeon: Kathleene Hazel, MD;  Location: [facility] OR;  Service: Open Heart Surgery;  Laterality: Bilateral;    FAMILY HISTORY: Family History  Problem Relation Age of Onset   Cancer Mother    Stroke Father     SOCIAL HISTORY: Social History   Socioeconomic History   Marital status: Married    Spouse name: Not on file   Number of children: 1   Years of education: Not on file   Highest education level: Not on file  Occupational History   Not on file  Tobacco Use   Smoking status: Former    Current packs/day: 0.00    Average packs/day: 0.3 packs/day for 5.0 years (1.3 ttl pk-yrs)    Types: Cigarettes    Start date: 61    Quit date: 73    Years since quitting: 55.5   Smokeless tobacco: Never  Vaping Use   Vaping status: Never Used  Substance and Sexual Activity   Alcohol use: No   Drug use: No   Sexual activity: Not on file  Other Topics Concern   Not on file  Social History Narrative   Not on file   Social Determinants of Health   Financial Resource Strain: Not on file  Food Insecurity: Not on file  Transportation Needs: Not on file  Physical Activity: Not on file  Stress: Not on file  Social Connections: Not on file  Intimate Partner Violence: Not on file    PHYSICAL EXAM  GENERAL EXAM/CONSTITUTIONAL: Vitals:  Vitals:   04/23/23 1359  BP: 115/86  Pulse: 98  Weight: 140 lb (63.5 kg)  Height: 5\' 5"  (1.651 m)   Body mass index is 23.3 kg/m. Wt Readings from Last 3 Encounters:  04/23/23 140 lb (63.5 kg)  01/14/23 140 lb (63.5 kg)  06/26/22 145 lb (65.8 kg)   Patient is in no distress; well developed, nourished and groomed; neck is supple, sitting in his wheelchair   EYES: Pupils round and reactive to light, Visual fields full to confrontation, Extraocular movements intacts,   MUSCULOSKELETAL: Gait, strength, tone, movements noted in Neurologic exam below  NEUROLOGIC: MENTAL STATUS:       No data to display         awake, alert, oriented to person, able to tell me the date. Difficulty with stating the days of the week forward. Was able to do so after multiple prompts.  normal attention and concentration language fluent, comprehension intact, naming intact fund of knowledge appropriate  CRANIAL NERVE:  2nd, 3rd, 4th, 6th - pupils equal and reactive to light, visual fields full to confrontation, extraocular muscles intact, no nystagmus 5th - facial sensation symmetric 7th - facial strength symmetric 8th - hearing intact 9th - palate elevates symmetrically, uvula midline 11th - shoulder shrug symmetric 12th - tongue protrusion midline  MOTOR:  normal bulk and tone, at least anti gravity in the BUE, BLE  SENSORY:  normal and symmetric to light touch  COORDINATION:  finger-nose-finger, fine finger movements normal  GAIT/STATION:  Ambulates with a walker. Stoop forward, wide base and small strides   DIAGNOSTIC DATA (LABS, IMAGING, TESTING) - I reviewed patient records, labs, notes, testing and imaging myself where available.  Lab Results  Component Value Date   WBC 9.4 08/18/2022   HGB 12.6 (L) 08/18/2022   HCT 38.8 (L) 08/18/2022   MCV 96.8 08/18/2022   PLT 203 08/18/2022      Component Value Date/Time   NA 140 02/07/2023 1333   NA 140 03/21/2022 1445   K 3.9 02/07/2023 1333   CL 108 02/07/2023 1333   CO2 25 02/07/2023 1333   GLUCOSE 98 02/07/2023 1333   BUN 13 02/07/2023 1333   BUN 12 03/21/2022 1445   CREATININE 1.03 02/07/2023 1333   CALCIUM 9.3 02/07/2023 1333   PROT 7.2 08/18/2022 1626   ALBUMIN 3.9 08/18/2022 1626   AST 18 08/18/2022 1626   ALT 22 08/18/2022 1626   ALKPHOS 155 (H) 08/18/2022 1626   BILITOT 0.8 08/18/2022 1626   GFRNONAA >60 02/07/2023 1333   GFRAA >60 05/13/2017 0233   Lab Results  Component Value Date   CHOL 184 03/28/2022   HDL 71 03/28/2022   LDLCALC 94 03/28/2022   TRIG 95 03/28/2022   CHOLHDL 2.6  03/28/2022   Lab Results  Component Value Date   HGBA1C 6.0 (H) 03/28/2022   No results found for: "VITAMINB12" No results found for: "TSH"  MRI and MRA Head 03/27/22 1. No acute intracranial abnormality. 2. Normal intracranial MRA. 3. Chronic small vessel ischemic changes and volume loss.   ASSESSMENT AND PLAN  89 y.o. year old male with vascular risk factors including hypertension, hyperlipidemia, heart disease who is presenting for follow-up after a TIA.  Overall doing well, on aspirin and Crestor and torsemide.  Plan for patient to continue current medications.  He does not have any new deficit. Advised him to continue following up with PCP and return as needed.   1. TIA (transient ischemic attack)   2. Gait abnormality      Patient Instructions  Continue current medications  Follow up as needed   No orders of the defined types were placed in this encounter.   No orders of the defined types were placed in this encounter.   Return if symptoms worsen or fail to improve.   Windell Norfolk, MD 04/23/2023, 5:40 PM  [facility] [address], [address] [place], [place] [address] [phone]

## 2023-05-01 DIAGNOSIS — Z7409 Other reduced mobility: Secondary | ICD-10-CM | POA: Diagnosis not present

## 2023-06-20 IMAGING — CT CT HEART MORP W/ CTA COR W/ SCORE W/ CA W/CM &/OR W/O CM
2 of 7 series · 11 of 20 positions shown, 13 images · IV contrast (APPLIED)
Comparison: Chest CTA 12/13/2021.
COMPARISON: Chest CTA 12/13/2021.

Addendum:
EXAM:
OVER-READ INTERPRETATION  CT CHEST

The following report is an over-read performed by radiologist Dr.
Jean-Louis Desenclos [REDACTED] on 12/25/2021. This
over-read does not include interpretation of cardiac or coronary
anatomy or pathology. The coronary calcium score/coronary CTA
interpretation by the cardiologist is attached.
CLINICAL DATA: Aortic Valve pathology with assessment for TAVR
Cardiac TAVR CT
TECHNIQUE: The patient was scanned on a Siemens Force [REDACTED]ice scanner. A 120
kV retrospective scan was triggered in the descending thoracic aorta
at 111 HU's. Gantry rotation speed was 270 msecs and collimation was
.9 mm. No beta blockade or nitro were given. The 3D data set was
reconstructed in 5% intervals of the R-R cycle. Systolic and
diastolic phases were analyzed on a dedicated work station using
MPR, MIP and VRT modes. The patient received 100 cc of contrast.

[Series 12: 0-90% · axial · 0.39mm/px · z∈[-244,-113]mm · 5 of 3260 slices shown]
[im 544/3260  vessel]
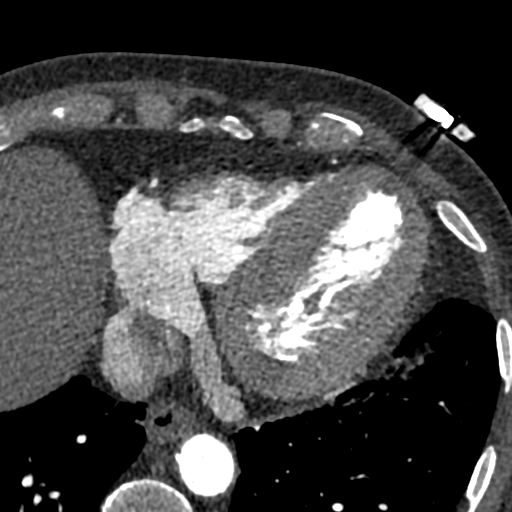
[im 1087/3260  vessel]
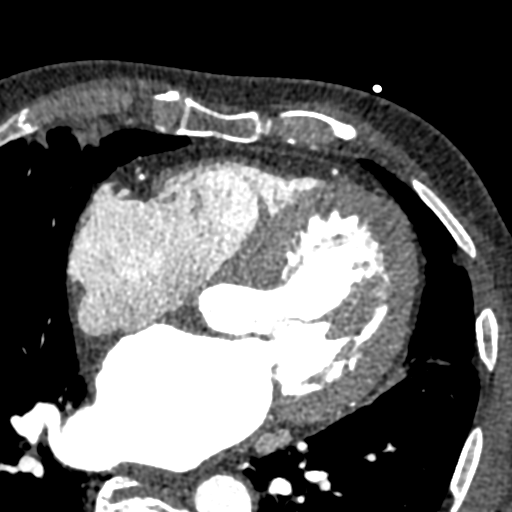
[im 1630/3260  vessel]
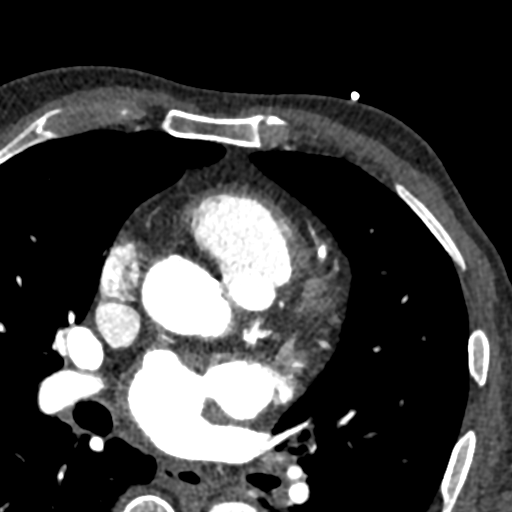
[im 2173/3260  vessel]
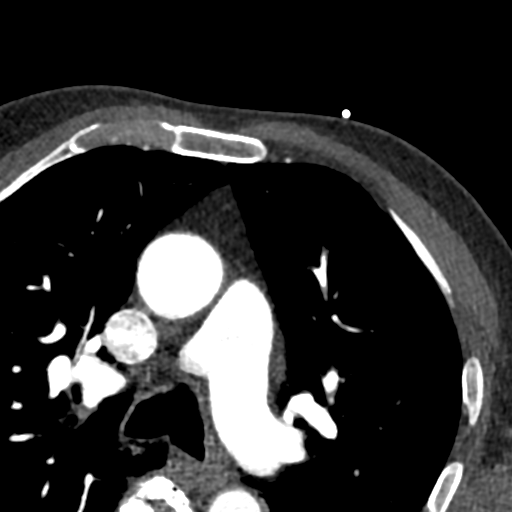
[im 2716/3260  vessel]
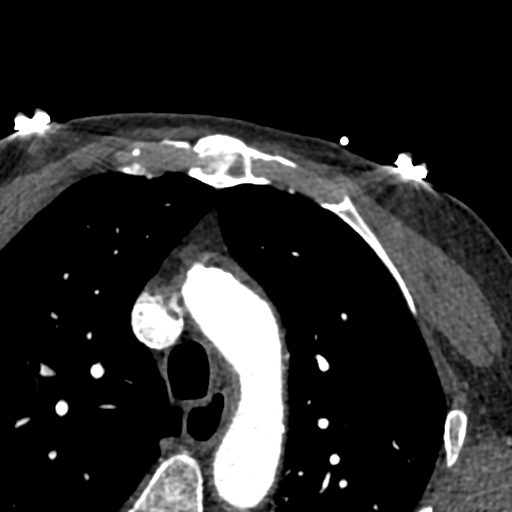

[Series 13: 5-95% · axial · 0.39mm/px · z∈[-248,-109]mm · 6 of 3260 slices shown, 8 images]
[im 466/3260  vessel]
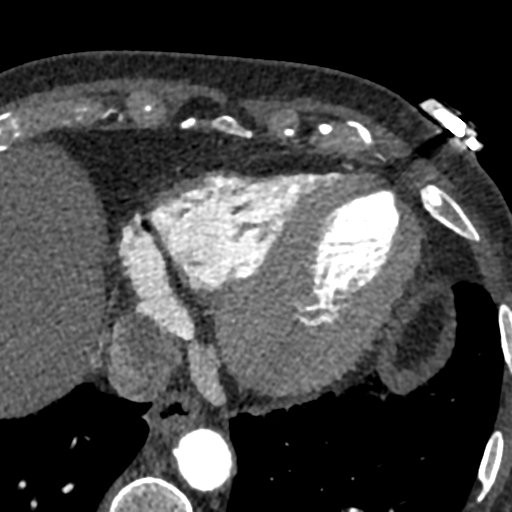
[im 466/3260  lung]
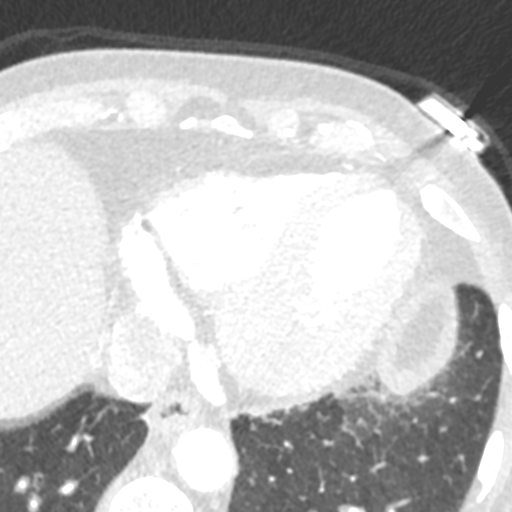
[im 932/3260  vessel]
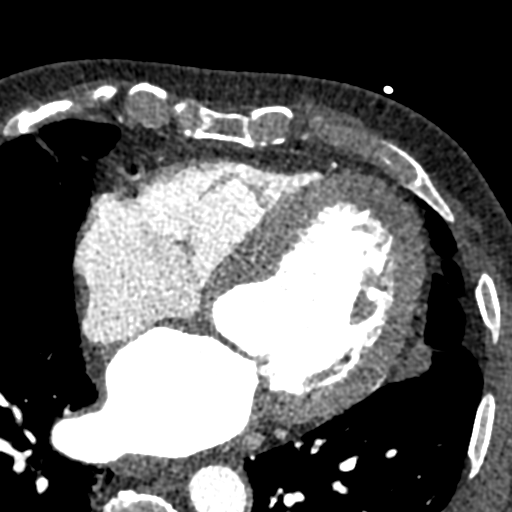
[im 1397/3260  vessel]
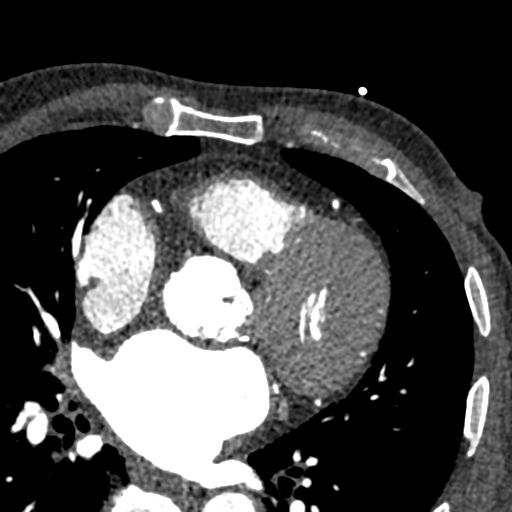
[im 1863/3260  vessel]
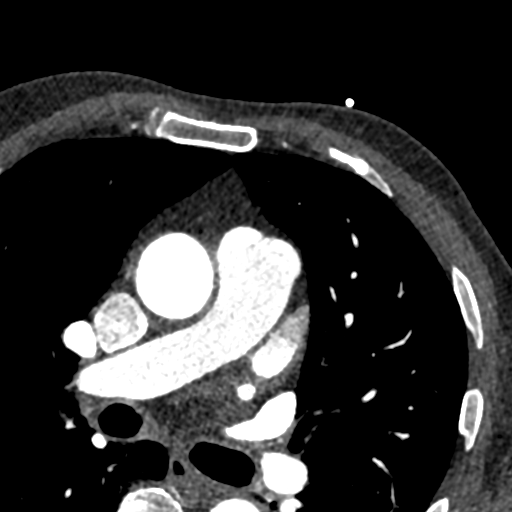
[im 2328/3260  vessel]
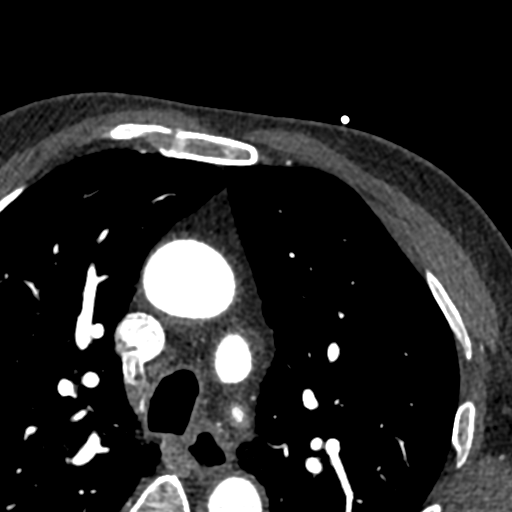
[im 2328/3260  lung]
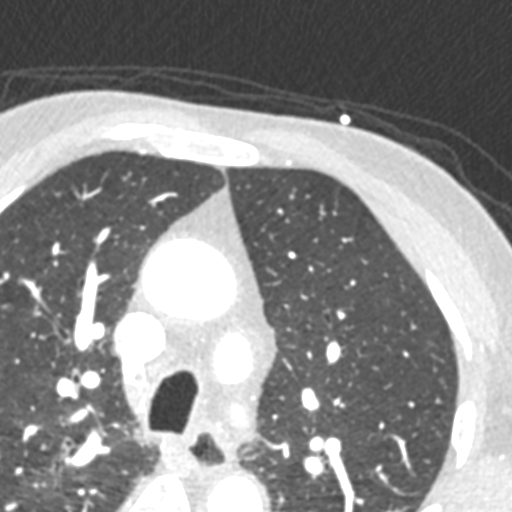
[im 2794/3260  vessel]
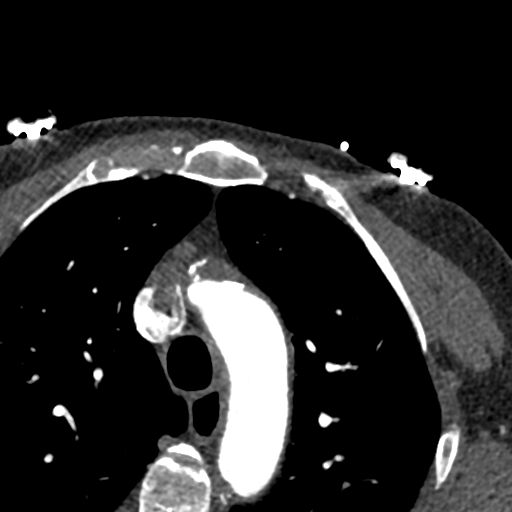

[11 of 20 positions shown; findings below may reference images not displayed]

FINDINGS: Extracardiac findings will be described separately under dictation
for contemporaneously obtained CTA chest, abdomen and pelvis.
IMPRESSION: Please see separate dictation for contemporaneously obtained CTA
chest, abdomen and pelvis dated 12/25/2021 for full description of
relevant extracardiac findings.
FINDINGS: Aortic Valve: Severely thickened tri-leaflet aortic valve with heavy
calcification and reduced excursion the planimeter valve area is
0.819 Sq cm consistent with severe aortic stenosis

Number of leaflets: 3

LVOT calcification: None

Annular calcification: None

Aortic Valve Calcium Score: 7459

Presence of basal septal hypertrophy: No

Perimembranous septal diameter: 11 mm

Mitral Valve: Mild mitral annular calcification

Aortic Annulus Measurements- 20% Phase assessed

Major annulus diameter: 30 mm

Minor annulus diameter: 24 mm

Annular perimeter: 83 mm

Annular area: 5.11 cm2

Aortic Root Measurements

Sinotubular Junction: 28 mm

Ascending Thoracic Aorta: 34 mm

Aortic Arch: 26 mm

Descending Thoracic Aorta: 18 mm

Aortic atherosclerosis.

Sinus of Valsalva Measurements:

Right coronary cusp width: 31 mm

Left coronary cusp width: 34 mm

Non coronary cusp width: 33 mm

Mean diameter: 32 mm

Coronary Artery Height above Annulus:

Left Main: 16 mm

Left SoV height: 22 mm

Right Coronary: 19 mm

Right SoV height: 22 mm

Optimum Fluoroscopic Angle for Delivery: LAO 10, [REDACTED]

Valves for structural team consideration:

26 mm Edwards Sapien is favored.

29 mm CoreValve

Non TAVR Valve Findings:

Coronary Calcium Score:

Left main: 141

Left anterior descending artery: 87

Left circumflex artery: 29

Right coronary artery: 93

Total: 350
IMPRESSION: 1. Severe Aortic stenosis. Findings pertinent to TAVR procedure are
detailed above.

2. Patient's total coronary artery calcium score is 350.

RECOMMENDATIONS:



If CAC = 0, it is reasonable to withhold statin therapy and reassess
in 5 to 10 years, as long as higher risk conditions are absent
(diabetes mellitus, family history of premature CHD in first degree
relatives (males <55 years; females <65 years), cigarette smoking,
LDL >=190 mg/dL or other independent risk factors).

If CAC is 1 to 99, it is reasonable to initiate statin therapy for
patients >=55 years of age.

If CAC is >=100 or >=75th percentile, it is reasonable to initiate
statin therapy at any age.

Cardiology referral should be considered for patients with CAC
scores >=400 or >=75th percentile.

*5558 AHA/ACC/AACVPR/AAPA/ABC/INETAS/BLONDINACKA/LEBEAU/TYLA/[place]/JN EDDY/AELTON
Guideline on the Management of Blood Cholesterol: A Report of the
American College of Cardiology/American Heart Association Task Force
on Clinical Practice Guidelines. J Am Coll Cardiol.
7415;73(24):3048-3344.

[employer]

*** End of Addendum ***
EXAM:
OVER-READ INTERPRETATION  CT CHEST

The following report is an over-read performed by radiologist Dr.
Tripti Tiger [REDACTED] on 12/25/2021. This
over-read does not include interpretation of cardiac or coronary
anatomy or pathology. The coronary calcium score/coronary CTA
interpretation by the cardiologist is attached.
FINDINGS: Extracardiac findings will be described separately under dictation
for contemporaneously obtained CTA chest, abdomen and pelvis.
IMPRESSION: Please see separate dictation for contemporaneously obtained CTA
chest, abdomen and pelvis dated 12/25/2021 for full description of
relevant extracardiac findings.

## 2023-06-20 IMAGING — CT CT CTA ABD/PEL W/CM AND/OR W/O CM
1 of 3 series · 1 of 13 positions shown · non-contrast
Comparison: Chest CTA 12/13/2021. CT the abdomen and pelvis
05/13/2017.

CLINICAL DATA: [AGE] male with history of severe aortic
stenosis. Preprocedural study prior to potential transcatheter
aortic valve replacement (TAVR) procedure.

EXAM:
CT ANGIOGRAPHY CHEST, ABDOMEN AND PELVIS
TECHNIQUE: Multidetector CT imaging through the chest, abdomen and pelvis was
performed using the standard protocol during bolus administration of
intravenous contrast. Multiplanar reconstructed images and MIPs were
obtained and reviewed to evaluate the vascular anatomy.

[Series 2888: — · 0.30mm/px · 1 of 6 slices shown]
[im 4/6]
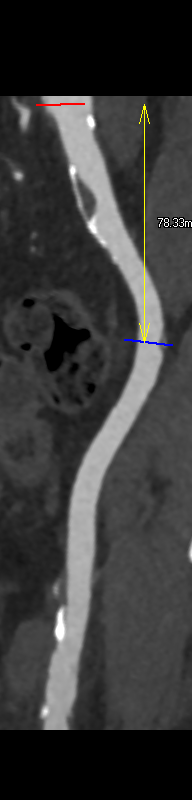

[1 of 13 positions shown; findings below may reference images not displayed]

RADIATION DOSE REDUCTION: This exam was performed according to the
departmental dose-optimization program which includes automated
exposure control, adjustment of the mA and/or kV according to
patient size and/or use of iterative reconstruction technique.

CONTRAST:  100mL OMNIPAQUE IOHEXOL 350 MG/ML SOLN
FINDINGS: CTA CHEST FINDINGS

Cardiovascular: Heart size is normal. There is no significant
pericardial fluid, thickening or pericardial calcification. There is
aortic atherosclerosis, as well as atherosclerosis of the great
vessels of the mediastinum and the coronary arteries, including
calcified atherosclerotic plaque in the left main and left anterior
descending coronary arteries. Severe thickening calcification of the
aortic valve.

Mediastinum/Lymph Nodes: No pathologically enlarged mediastinal or
hilar lymph nodes. Esophagus is unremarkable in appearance. No
axillary lymphadenopathy.

Lungs/Pleura: Small calcified granuloma in the right upper lobe. No
other suspicious appearing pulmonary nodules or masses are noted.
Areas of presumed post infectious or inflammatory scarring are noted
in the lung bases bilaterally, most evident in the right middle
lobe. No acute consolidative airspace disease. No pleural effusions.

Musculoskeletal/Soft Tissues: There are no aggressive appearing
lytic or blastic lesions noted in the visualized portions of the
skeleton.

CTA ABDOMEN AND PELVIS FINDINGS

Hepatobiliary: No suspicious cystic or solid hepatic lesions. No
intra or extrahepatic biliary ductal dilatation. Gallbladder is
normal in appearance.

Pancreas: No pancreatic mass. Again noted is dilatation of the main
pancreatic duct up to 5 mm in the body of the pancreas, with normal
caliber duct in the head and tail of the pancreas. This is stable
dating back to 9825 and is of uncertain etiology and significance,
but presumably a benign finding. No solid pancreatic mass. No
peripancreatic fluid collections or inflammatory changes.

Spleen: Unremarkable.

Adrenals/Urinary Tract: Generalized cortical thinning and mild renal
atrophy bilaterally. Multiple low-attenuation nonenhancing lesions
in both kidneys, compatible with simple cysts, largest of which is
exophytic in the posterior aspect of the upper pole of the right
kidney measuring 6.2 cm in diameter. Other subcentimeter
low-attenuation lesions in both kidneys, too small to definitively
characterize, but favored to represent tiny cysts. Bilateral adrenal
glands are normal in appearance. No hydroureteronephrosis. Urinary
bladder is normal in appearance.

Stomach/Bowel: Normal appearance of the stomach. No pathologic
dilatation of small bowel or colon. Multiple colonic diverticulae
are noted, particularly in the sigmoid colon, without surrounding
inflammatory changes to suggest an acute diverticulitis at this
time. Normal appendix.

Vascular/Lymphatic: Vascular findings and measurements pertinent to
potential TAVR procedure, as detailed below. Notably, there is a
short segment dissection of the infrarenal abdominal aorta (axial
image 134 of series 8) resulting in focal luminal narrowing in this
region. No lymphadenopathy noted in the abdomen or pelvis.

Reproductive: Prostate gland and seminal vesicles are unremarkable
in appearance.

Other: No significant volume of ascites.  No pneumoperitoneum.

Musculoskeletal: There are no aggressive appearing lytic or blastic
lesions noted in the visualized portions of the skeleton.

VASCULAR MEASUREMENTS PERTINENT TO TAVR:

AORTA:

Minimal Aortic Diameter-P.I x 0.6 cm mm

Severity of Aortic Calcification-moderate

RIGHT PELVIS:

Right Common Iliac Artery -

Minimal Oiameter-K.U x 5.1 mm

Tortuosity-mild

Calcification-mild-to-moderate

Right External Iliac Artery -

Minimal 1iameter-K.4 x 6.6 mm

Tortuosity-moderate

Calcification-mild

Right Common Femoral Artery -

Minimal Niameter-T.E x 5.3 mm

Tortuosity-mild

Calcification-moderate

LEFT PELVIS:

Left Common Iliac Artery -

Minimal Uiameter-N.5 x 5.9 mm

Tortuosity-mild

Calcification-mild

Left External Iliac Artery -

Minimal 0iameter-W.W x 5.8 mm

Tortuosity-moderate

Calcification-mild

Left Common Femoral Artery -

Minimal Aiameter-Z.Y x 5.8 mm

Tortuosity-mild

Calcification-moderate

Review of the MIP images confirms the above findings.
IMPRESSION: 1. Vascular findings and measurements pertinent to potential TAVR
procedure, as detailed above. Please note the presence of a short
segment dissection of the infrarenal abdominal aorta which focally
narrows the lumen.
2. Severe thickening calcification of the aortic valve, compatible
with reported clinical history of severe aortic stenosis.
3. Colonic diverticulosis without evidence of acute diverticulitis
at this time.
4. Additional incidental findings, similar to the prior studies, as
above.

## 2023-08-10 DIAGNOSIS — M5442 Lumbago with sciatica, left side: Secondary | ICD-10-CM | POA: Diagnosis not present

## 2023-08-12 DIAGNOSIS — W19XXXD Unspecified fall, subsequent encounter: Secondary | ICD-10-CM | POA: Diagnosis not present

## 2023-08-12 DIAGNOSIS — Z7982 Long term (current) use of aspirin: Secondary | ICD-10-CM | POA: Diagnosis not present

## 2023-08-12 DIAGNOSIS — I5030 Unspecified diastolic (congestive) heart failure: Secondary | ICD-10-CM | POA: Diagnosis not present

## 2023-08-12 DIAGNOSIS — M5442 Lumbago with sciatica, left side: Secondary | ICD-10-CM | POA: Diagnosis not present

## 2023-08-12 DIAGNOSIS — I350 Nonrheumatic aortic (valve) stenosis: Secondary | ICD-10-CM | POA: Diagnosis not present

## 2023-08-12 DIAGNOSIS — I110 Hypertensive heart disease with heart failure: Secondary | ICD-10-CM | POA: Diagnosis not present

## 2023-08-12 DIAGNOSIS — Z8673 Personal history of transient ischemic attack (TIA), and cerebral infarction without residual deficits: Secondary | ICD-10-CM | POA: Diagnosis not present

## 2023-08-12 DIAGNOSIS — M4807 Spinal stenosis, lumbosacral region: Secondary | ICD-10-CM | POA: Diagnosis not present

## 2023-08-14 DIAGNOSIS — I350 Nonrheumatic aortic (valve) stenosis: Secondary | ICD-10-CM | POA: Diagnosis not present

## 2023-08-14 DIAGNOSIS — Z7982 Long term (current) use of aspirin: Secondary | ICD-10-CM | POA: Diagnosis not present

## 2023-08-14 DIAGNOSIS — M4807 Spinal stenosis, lumbosacral region: Secondary | ICD-10-CM | POA: Diagnosis not present

## 2023-08-14 DIAGNOSIS — I110 Hypertensive heart disease with heart failure: Secondary | ICD-10-CM | POA: Diagnosis not present

## 2023-08-14 DIAGNOSIS — M5442 Lumbago with sciatica, left side: Secondary | ICD-10-CM | POA: Diagnosis not present

## 2023-08-14 DIAGNOSIS — I5030 Unspecified diastolic (congestive) heart failure: Secondary | ICD-10-CM | POA: Diagnosis not present

## 2023-08-14 DIAGNOSIS — Z8673 Personal history of transient ischemic attack (TIA), and cerebral infarction without residual deficits: Secondary | ICD-10-CM | POA: Diagnosis not present

## 2023-08-14 DIAGNOSIS — W19XXXD Unspecified fall, subsequent encounter: Secondary | ICD-10-CM | POA: Diagnosis not present

## 2023-08-17 DIAGNOSIS — Z7982 Long term (current) use of aspirin: Secondary | ICD-10-CM | POA: Diagnosis not present

## 2023-08-17 DIAGNOSIS — M5442 Lumbago with sciatica, left side: Secondary | ICD-10-CM | POA: Diagnosis not present

## 2023-08-17 DIAGNOSIS — M4807 Spinal stenosis, lumbosacral region: Secondary | ICD-10-CM | POA: Diagnosis not present

## 2023-08-17 DIAGNOSIS — I110 Hypertensive heart disease with heart failure: Secondary | ICD-10-CM | POA: Diagnosis not present

## 2023-08-17 DIAGNOSIS — Z8673 Personal history of transient ischemic attack (TIA), and cerebral infarction without residual deficits: Secondary | ICD-10-CM | POA: Diagnosis not present

## 2023-08-17 DIAGNOSIS — I5030 Unspecified diastolic (congestive) heart failure: Secondary | ICD-10-CM | POA: Diagnosis not present

## 2023-08-17 DIAGNOSIS — W19XXXD Unspecified fall, subsequent encounter: Secondary | ICD-10-CM | POA: Diagnosis not present

## 2023-08-17 DIAGNOSIS — I350 Nonrheumatic aortic (valve) stenosis: Secondary | ICD-10-CM | POA: Diagnosis not present

## 2023-08-19 DIAGNOSIS — I350 Nonrheumatic aortic (valve) stenosis: Secondary | ICD-10-CM | POA: Diagnosis not present

## 2023-08-19 DIAGNOSIS — Z8673 Personal history of transient ischemic attack (TIA), and cerebral infarction without residual deficits: Secondary | ICD-10-CM | POA: Diagnosis not present

## 2023-08-19 DIAGNOSIS — M5442 Lumbago with sciatica, left side: Secondary | ICD-10-CM | POA: Diagnosis not present

## 2023-08-19 DIAGNOSIS — W19XXXD Unspecified fall, subsequent encounter: Secondary | ICD-10-CM | POA: Diagnosis not present

## 2023-08-19 DIAGNOSIS — M4807 Spinal stenosis, lumbosacral region: Secondary | ICD-10-CM | POA: Diagnosis not present

## 2023-08-19 DIAGNOSIS — I110 Hypertensive heart disease with heart failure: Secondary | ICD-10-CM | POA: Diagnosis not present

## 2023-08-19 DIAGNOSIS — I5030 Unspecified diastolic (congestive) heart failure: Secondary | ICD-10-CM | POA: Diagnosis not present

## 2023-08-19 DIAGNOSIS — Z7982 Long term (current) use of aspirin: Secondary | ICD-10-CM | POA: Diagnosis not present

## 2023-08-24 ENCOUNTER — Ambulatory Visit: Payer: Medicare Other | Admitting: Podiatry

## 2023-08-24 DIAGNOSIS — Z8673 Personal history of transient ischemic attack (TIA), and cerebral infarction without residual deficits: Secondary | ICD-10-CM | POA: Diagnosis not present

## 2023-08-24 DIAGNOSIS — M4807 Spinal stenosis, lumbosacral region: Secondary | ICD-10-CM | POA: Diagnosis not present

## 2023-08-24 DIAGNOSIS — I110 Hypertensive heart disease with heart failure: Secondary | ICD-10-CM | POA: Diagnosis not present

## 2023-08-24 DIAGNOSIS — I350 Nonrheumatic aortic (valve) stenosis: Secondary | ICD-10-CM | POA: Diagnosis not present

## 2023-08-24 DIAGNOSIS — I5030 Unspecified diastolic (congestive) heart failure: Secondary | ICD-10-CM | POA: Diagnosis not present

## 2023-08-24 DIAGNOSIS — M5442 Lumbago with sciatica, left side: Secondary | ICD-10-CM | POA: Diagnosis not present

## 2023-08-24 DIAGNOSIS — Z7982 Long term (current) use of aspirin: Secondary | ICD-10-CM | POA: Diagnosis not present

## 2023-08-24 DIAGNOSIS — W19XXXD Unspecified fall, subsequent encounter: Secondary | ICD-10-CM | POA: Diagnosis not present

## 2023-08-26 DIAGNOSIS — I110 Hypertensive heart disease with heart failure: Secondary | ICD-10-CM | POA: Diagnosis not present

## 2023-08-26 DIAGNOSIS — M4807 Spinal stenosis, lumbosacral region: Secondary | ICD-10-CM | POA: Diagnosis not present

## 2023-08-26 DIAGNOSIS — Z8673 Personal history of transient ischemic attack (TIA), and cerebral infarction without residual deficits: Secondary | ICD-10-CM | POA: Diagnosis not present

## 2023-08-26 DIAGNOSIS — I5030 Unspecified diastolic (congestive) heart failure: Secondary | ICD-10-CM | POA: Diagnosis not present

## 2023-08-26 DIAGNOSIS — M5442 Lumbago with sciatica, left side: Secondary | ICD-10-CM | POA: Diagnosis not present

## 2023-08-26 DIAGNOSIS — W19XXXD Unspecified fall, subsequent encounter: Secondary | ICD-10-CM | POA: Diagnosis not present

## 2023-08-26 DIAGNOSIS — Z7982 Long term (current) use of aspirin: Secondary | ICD-10-CM | POA: Diagnosis not present

## 2023-08-26 DIAGNOSIS — I350 Nonrheumatic aortic (valve) stenosis: Secondary | ICD-10-CM | POA: Diagnosis not present

## 2023-09-01 DIAGNOSIS — M4807 Spinal stenosis, lumbosacral region: Secondary | ICD-10-CM | POA: Diagnosis not present

## 2023-09-01 DIAGNOSIS — Z8673 Personal history of transient ischemic attack (TIA), and cerebral infarction without residual deficits: Secondary | ICD-10-CM | POA: Diagnosis not present

## 2023-09-01 DIAGNOSIS — I5030 Unspecified diastolic (congestive) heart failure: Secondary | ICD-10-CM | POA: Diagnosis not present

## 2023-09-01 DIAGNOSIS — Z7982 Long term (current) use of aspirin: Secondary | ICD-10-CM | POA: Diagnosis not present

## 2023-09-01 DIAGNOSIS — W19XXXD Unspecified fall, subsequent encounter: Secondary | ICD-10-CM | POA: Diagnosis not present

## 2023-09-01 DIAGNOSIS — I350 Nonrheumatic aortic (valve) stenosis: Secondary | ICD-10-CM | POA: Diagnosis not present

## 2023-09-01 DIAGNOSIS — I110 Hypertensive heart disease with heart failure: Secondary | ICD-10-CM | POA: Diagnosis not present

## 2023-09-01 DIAGNOSIS — M5442 Lumbago with sciatica, left side: Secondary | ICD-10-CM | POA: Diagnosis not present

## 2023-09-04 DIAGNOSIS — I5030 Unspecified diastolic (congestive) heart failure: Secondary | ICD-10-CM | POA: Diagnosis not present

## 2023-09-04 DIAGNOSIS — Z8673 Personal history of transient ischemic attack (TIA), and cerebral infarction without residual deficits: Secondary | ICD-10-CM | POA: Diagnosis not present

## 2023-09-04 DIAGNOSIS — M5442 Lumbago with sciatica, left side: Secondary | ICD-10-CM | POA: Diagnosis not present

## 2023-09-04 DIAGNOSIS — I110 Hypertensive heart disease with heart failure: Secondary | ICD-10-CM | POA: Diagnosis not present

## 2023-09-04 DIAGNOSIS — W19XXXD Unspecified fall, subsequent encounter: Secondary | ICD-10-CM | POA: Diagnosis not present

## 2023-09-04 DIAGNOSIS — Z7982 Long term (current) use of aspirin: Secondary | ICD-10-CM | POA: Diagnosis not present

## 2023-09-04 DIAGNOSIS — I350 Nonrheumatic aortic (valve) stenosis: Secondary | ICD-10-CM | POA: Diagnosis not present

## 2023-09-04 DIAGNOSIS — M4807 Spinal stenosis, lumbosacral region: Secondary | ICD-10-CM | POA: Diagnosis not present

## 2023-09-09 DIAGNOSIS — Z8673 Personal history of transient ischemic attack (TIA), and cerebral infarction without residual deficits: Secondary | ICD-10-CM | POA: Diagnosis not present

## 2023-09-09 DIAGNOSIS — I350 Nonrheumatic aortic (valve) stenosis: Secondary | ICD-10-CM | POA: Diagnosis not present

## 2023-09-09 DIAGNOSIS — W19XXXD Unspecified fall, subsequent encounter: Secondary | ICD-10-CM | POA: Diagnosis not present

## 2023-09-09 DIAGNOSIS — Z7982 Long term (current) use of aspirin: Secondary | ICD-10-CM | POA: Diagnosis not present

## 2023-09-09 DIAGNOSIS — M4807 Spinal stenosis, lumbosacral region: Secondary | ICD-10-CM | POA: Diagnosis not present

## 2023-09-09 DIAGNOSIS — M5442 Lumbago with sciatica, left side: Secondary | ICD-10-CM | POA: Diagnosis not present

## 2023-09-09 DIAGNOSIS — I110 Hypertensive heart disease with heart failure: Secondary | ICD-10-CM | POA: Diagnosis not present

## 2023-09-09 DIAGNOSIS — I5030 Unspecified diastolic (congestive) heart failure: Secondary | ICD-10-CM | POA: Diagnosis not present

## 2023-09-15 DIAGNOSIS — I350 Nonrheumatic aortic (valve) stenosis: Secondary | ICD-10-CM | POA: Diagnosis not present

## 2023-09-15 DIAGNOSIS — I5030 Unspecified diastolic (congestive) heart failure: Secondary | ICD-10-CM | POA: Diagnosis not present

## 2023-09-15 DIAGNOSIS — W19XXXD Unspecified fall, subsequent encounter: Secondary | ICD-10-CM | POA: Diagnosis not present

## 2023-09-15 DIAGNOSIS — I110 Hypertensive heart disease with heart failure: Secondary | ICD-10-CM | POA: Diagnosis not present

## 2023-09-15 DIAGNOSIS — M5442 Lumbago with sciatica, left side: Secondary | ICD-10-CM | POA: Diagnosis not present

## 2023-09-15 DIAGNOSIS — M4807 Spinal stenosis, lumbosacral region: Secondary | ICD-10-CM | POA: Diagnosis not present

## 2023-09-15 DIAGNOSIS — Z7982 Long term (current) use of aspirin: Secondary | ICD-10-CM | POA: Diagnosis not present

## 2023-09-15 DIAGNOSIS — Z8673 Personal history of transient ischemic attack (TIA), and cerebral infarction without residual deficits: Secondary | ICD-10-CM | POA: Diagnosis not present

## 2023-11-06 DIAGNOSIS — E119 Type 2 diabetes mellitus without complications: Secondary | ICD-10-CM | POA: Diagnosis not present

## 2023-11-06 DIAGNOSIS — Z23 Encounter for immunization: Secondary | ICD-10-CM | POA: Diagnosis not present

## 2023-11-06 DIAGNOSIS — E7800 Pure hypercholesterolemia, unspecified: Secondary | ICD-10-CM | POA: Diagnosis not present

## 2023-11-06 DIAGNOSIS — Z0000 Encounter for general adult medical examination without abnormal findings: Secondary | ICD-10-CM | POA: Diagnosis not present

## 2023-11-06 DIAGNOSIS — I5030 Unspecified diastolic (congestive) heart failure: Secondary | ICD-10-CM | POA: Diagnosis not present

## 2023-11-06 DIAGNOSIS — N183 Chronic kidney disease, stage 3 unspecified: Secondary | ICD-10-CM | POA: Diagnosis not present

## 2023-11-06 DIAGNOSIS — M4807 Spinal stenosis, lumbosacral region: Secondary | ICD-10-CM | POA: Diagnosis not present

## 2023-11-06 DIAGNOSIS — Z79899 Other long term (current) drug therapy: Secondary | ICD-10-CM | POA: Diagnosis not present

## 2023-11-06 DIAGNOSIS — Z7409 Other reduced mobility: Secondary | ICD-10-CM | POA: Diagnosis not present

## 2023-11-06 DIAGNOSIS — I350 Nonrheumatic aortic (valve) stenosis: Secondary | ICD-10-CM | POA: Diagnosis not present

## 2023-11-06 DIAGNOSIS — I10 Essential (primary) hypertension: Secondary | ICD-10-CM | POA: Diagnosis not present

## 2023-11-06 DIAGNOSIS — R54 Age-related physical debility: Secondary | ICD-10-CM | POA: Diagnosis not present

## 2023-11-12 DIAGNOSIS — M5442 Lumbago with sciatica, left side: Secondary | ICD-10-CM | POA: Diagnosis not present

## 2023-11-12 DIAGNOSIS — M4807 Spinal stenosis, lumbosacral region: Secondary | ICD-10-CM | POA: Diagnosis not present

## 2023-11-12 DIAGNOSIS — I130 Hypertensive heart and chronic kidney disease with heart failure and stage 1 through stage 4 chronic kidney disease, or unspecified chronic kidney disease: Secondary | ICD-10-CM | POA: Diagnosis not present

## 2023-11-12 DIAGNOSIS — M21372 Foot drop, left foot: Secondary | ICD-10-CM | POA: Diagnosis not present

## 2023-11-12 DIAGNOSIS — I5032 Chronic diastolic (congestive) heart failure: Secondary | ICD-10-CM | POA: Diagnosis not present

## 2023-11-12 DIAGNOSIS — E7800 Pure hypercholesterolemia, unspecified: Secondary | ICD-10-CM | POA: Diagnosis not present

## 2023-11-12 DIAGNOSIS — E1122 Type 2 diabetes mellitus with diabetic chronic kidney disease: Secondary | ICD-10-CM | POA: Diagnosis not present

## 2023-11-12 DIAGNOSIS — N1832 Chronic kidney disease, stage 3b: Secondary | ICD-10-CM | POA: Diagnosis not present

## 2023-11-18 DIAGNOSIS — M5442 Lumbago with sciatica, left side: Secondary | ICD-10-CM | POA: Diagnosis not present

## 2023-11-18 DIAGNOSIS — E1122 Type 2 diabetes mellitus with diabetic chronic kidney disease: Secondary | ICD-10-CM | POA: Diagnosis not present

## 2023-11-18 DIAGNOSIS — M4807 Spinal stenosis, lumbosacral region: Secondary | ICD-10-CM | POA: Diagnosis not present

## 2023-11-18 DIAGNOSIS — N1832 Chronic kidney disease, stage 3b: Secondary | ICD-10-CM | POA: Diagnosis not present

## 2023-11-18 DIAGNOSIS — E7800 Pure hypercholesterolemia, unspecified: Secondary | ICD-10-CM | POA: Diagnosis not present

## 2023-11-18 DIAGNOSIS — I130 Hypertensive heart and chronic kidney disease with heart failure and stage 1 through stage 4 chronic kidney disease, or unspecified chronic kidney disease: Secondary | ICD-10-CM | POA: Diagnosis not present

## 2023-11-18 DIAGNOSIS — M21372 Foot drop, left foot: Secondary | ICD-10-CM | POA: Diagnosis not present

## 2023-11-18 DIAGNOSIS — I5032 Chronic diastolic (congestive) heart failure: Secondary | ICD-10-CM | POA: Diagnosis not present

## 2023-11-20 DIAGNOSIS — M4807 Spinal stenosis, lumbosacral region: Secondary | ICD-10-CM | POA: Diagnosis not present

## 2023-11-20 DIAGNOSIS — M21372 Foot drop, left foot: Secondary | ICD-10-CM | POA: Diagnosis not present

## 2023-11-20 DIAGNOSIS — E7800 Pure hypercholesterolemia, unspecified: Secondary | ICD-10-CM | POA: Diagnosis not present

## 2023-11-20 DIAGNOSIS — I5032 Chronic diastolic (congestive) heart failure: Secondary | ICD-10-CM | POA: Diagnosis not present

## 2023-11-20 DIAGNOSIS — N1832 Chronic kidney disease, stage 3b: Secondary | ICD-10-CM | POA: Diagnosis not present

## 2023-11-20 DIAGNOSIS — M5442 Lumbago with sciatica, left side: Secondary | ICD-10-CM | POA: Diagnosis not present

## 2023-11-20 DIAGNOSIS — I130 Hypertensive heart and chronic kidney disease with heart failure and stage 1 through stage 4 chronic kidney disease, or unspecified chronic kidney disease: Secondary | ICD-10-CM | POA: Diagnosis not present

## 2023-11-20 DIAGNOSIS — E1122 Type 2 diabetes mellitus with diabetic chronic kidney disease: Secondary | ICD-10-CM | POA: Diagnosis not present

## 2023-11-24 DIAGNOSIS — M5442 Lumbago with sciatica, left side: Secondary | ICD-10-CM | POA: Diagnosis not present

## 2023-11-24 DIAGNOSIS — E1122 Type 2 diabetes mellitus with diabetic chronic kidney disease: Secondary | ICD-10-CM | POA: Diagnosis not present

## 2023-11-24 DIAGNOSIS — E7800 Pure hypercholesterolemia, unspecified: Secondary | ICD-10-CM | POA: Diagnosis not present

## 2023-11-24 DIAGNOSIS — M4807 Spinal stenosis, lumbosacral region: Secondary | ICD-10-CM | POA: Diagnosis not present

## 2023-11-24 DIAGNOSIS — N1832 Chronic kidney disease, stage 3b: Secondary | ICD-10-CM | POA: Diagnosis not present

## 2023-11-24 DIAGNOSIS — I5032 Chronic diastolic (congestive) heart failure: Secondary | ICD-10-CM | POA: Diagnosis not present

## 2023-11-24 DIAGNOSIS — M21372 Foot drop, left foot: Secondary | ICD-10-CM | POA: Diagnosis not present

## 2023-11-24 DIAGNOSIS — I130 Hypertensive heart and chronic kidney disease with heart failure and stage 1 through stage 4 chronic kidney disease, or unspecified chronic kidney disease: Secondary | ICD-10-CM | POA: Diagnosis not present

## 2023-11-26 DIAGNOSIS — M5442 Lumbago with sciatica, left side: Secondary | ICD-10-CM | POA: Diagnosis not present

## 2023-11-26 DIAGNOSIS — E7800 Pure hypercholesterolemia, unspecified: Secondary | ICD-10-CM | POA: Diagnosis not present

## 2023-11-26 DIAGNOSIS — E1122 Type 2 diabetes mellitus with diabetic chronic kidney disease: Secondary | ICD-10-CM | POA: Diagnosis not present

## 2023-11-26 DIAGNOSIS — M21372 Foot drop, left foot: Secondary | ICD-10-CM | POA: Diagnosis not present

## 2023-11-26 DIAGNOSIS — N1832 Chronic kidney disease, stage 3b: Secondary | ICD-10-CM | POA: Diagnosis not present

## 2023-11-26 DIAGNOSIS — M4807 Spinal stenosis, lumbosacral region: Secondary | ICD-10-CM | POA: Diagnosis not present

## 2023-11-26 DIAGNOSIS — I5032 Chronic diastolic (congestive) heart failure: Secondary | ICD-10-CM | POA: Diagnosis not present

## 2023-11-26 DIAGNOSIS — I130 Hypertensive heart and chronic kidney disease with heart failure and stage 1 through stage 4 chronic kidney disease, or unspecified chronic kidney disease: Secondary | ICD-10-CM | POA: Diagnosis not present

## 2023-12-01 DIAGNOSIS — M4807 Spinal stenosis, lumbosacral region: Secondary | ICD-10-CM | POA: Diagnosis not present

## 2023-12-01 DIAGNOSIS — E7800 Pure hypercholesterolemia, unspecified: Secondary | ICD-10-CM | POA: Diagnosis not present

## 2023-12-01 DIAGNOSIS — E1122 Type 2 diabetes mellitus with diabetic chronic kidney disease: Secondary | ICD-10-CM | POA: Diagnosis not present

## 2023-12-01 DIAGNOSIS — N1832 Chronic kidney disease, stage 3b: Secondary | ICD-10-CM | POA: Diagnosis not present

## 2023-12-01 DIAGNOSIS — M5442 Lumbago with sciatica, left side: Secondary | ICD-10-CM | POA: Diagnosis not present

## 2023-12-01 DIAGNOSIS — M21372 Foot drop, left foot: Secondary | ICD-10-CM | POA: Diagnosis not present

## 2023-12-01 DIAGNOSIS — I130 Hypertensive heart and chronic kidney disease with heart failure and stage 1 through stage 4 chronic kidney disease, or unspecified chronic kidney disease: Secondary | ICD-10-CM | POA: Diagnosis not present

## 2023-12-01 DIAGNOSIS — I5032 Chronic diastolic (congestive) heart failure: Secondary | ICD-10-CM | POA: Diagnosis not present

## 2023-12-09 DIAGNOSIS — I130 Hypertensive heart and chronic kidney disease with heart failure and stage 1 through stage 4 chronic kidney disease, or unspecified chronic kidney disease: Secondary | ICD-10-CM | POA: Diagnosis not present

## 2023-12-09 DIAGNOSIS — M21372 Foot drop, left foot: Secondary | ICD-10-CM | POA: Diagnosis not present

## 2023-12-09 DIAGNOSIS — E7800 Pure hypercholesterolemia, unspecified: Secondary | ICD-10-CM | POA: Diagnosis not present

## 2023-12-09 DIAGNOSIS — M4807 Spinal stenosis, lumbosacral region: Secondary | ICD-10-CM | POA: Diagnosis not present

## 2023-12-09 DIAGNOSIS — I5032 Chronic diastolic (congestive) heart failure: Secondary | ICD-10-CM | POA: Diagnosis not present

## 2023-12-09 DIAGNOSIS — N1832 Chronic kidney disease, stage 3b: Secondary | ICD-10-CM | POA: Diagnosis not present

## 2023-12-09 DIAGNOSIS — E1122 Type 2 diabetes mellitus with diabetic chronic kidney disease: Secondary | ICD-10-CM | POA: Diagnosis not present

## 2023-12-09 DIAGNOSIS — M5442 Lumbago with sciatica, left side: Secondary | ICD-10-CM | POA: Diagnosis not present

## 2023-12-15 DIAGNOSIS — M4807 Spinal stenosis, lumbosacral region: Secondary | ICD-10-CM | POA: Diagnosis not present

## 2023-12-15 DIAGNOSIS — M5442 Lumbago with sciatica, left side: Secondary | ICD-10-CM | POA: Diagnosis not present

## 2023-12-15 DIAGNOSIS — M21372 Foot drop, left foot: Secondary | ICD-10-CM | POA: Diagnosis not present

## 2023-12-15 DIAGNOSIS — I130 Hypertensive heart and chronic kidney disease with heart failure and stage 1 through stage 4 chronic kidney disease, or unspecified chronic kidney disease: Secondary | ICD-10-CM | POA: Diagnosis not present

## 2023-12-15 DIAGNOSIS — E1122 Type 2 diabetes mellitus with diabetic chronic kidney disease: Secondary | ICD-10-CM | POA: Diagnosis not present

## 2023-12-15 DIAGNOSIS — I5032 Chronic diastolic (congestive) heart failure: Secondary | ICD-10-CM | POA: Diagnosis not present

## 2023-12-15 DIAGNOSIS — E7800 Pure hypercholesterolemia, unspecified: Secondary | ICD-10-CM | POA: Diagnosis not present

## 2023-12-15 DIAGNOSIS — N1832 Chronic kidney disease, stage 3b: Secondary | ICD-10-CM | POA: Diagnosis not present

## 2023-12-25 DIAGNOSIS — E1122 Type 2 diabetes mellitus with diabetic chronic kidney disease: Secondary | ICD-10-CM | POA: Diagnosis not present

## 2023-12-25 DIAGNOSIS — M5442 Lumbago with sciatica, left side: Secondary | ICD-10-CM | POA: Diagnosis not present

## 2023-12-25 DIAGNOSIS — N1832 Chronic kidney disease, stage 3b: Secondary | ICD-10-CM | POA: Diagnosis not present

## 2023-12-25 DIAGNOSIS — M21372 Foot drop, left foot: Secondary | ICD-10-CM | POA: Diagnosis not present

## 2023-12-25 DIAGNOSIS — I130 Hypertensive heart and chronic kidney disease with heart failure and stage 1 through stage 4 chronic kidney disease, or unspecified chronic kidney disease: Secondary | ICD-10-CM | POA: Diagnosis not present

## 2023-12-25 DIAGNOSIS — E7800 Pure hypercholesterolemia, unspecified: Secondary | ICD-10-CM | POA: Diagnosis not present

## 2023-12-25 DIAGNOSIS — M4807 Spinal stenosis, lumbosacral region: Secondary | ICD-10-CM | POA: Diagnosis not present

## 2023-12-25 DIAGNOSIS — I5032 Chronic diastolic (congestive) heart failure: Secondary | ICD-10-CM | POA: Diagnosis not present

## 2023-12-31 DIAGNOSIS — M4807 Spinal stenosis, lumbosacral region: Secondary | ICD-10-CM | POA: Diagnosis not present

## 2023-12-31 DIAGNOSIS — M21372 Foot drop, left foot: Secondary | ICD-10-CM | POA: Diagnosis not present

## 2023-12-31 DIAGNOSIS — E1122 Type 2 diabetes mellitus with diabetic chronic kidney disease: Secondary | ICD-10-CM | POA: Diagnosis not present

## 2023-12-31 DIAGNOSIS — I5032 Chronic diastolic (congestive) heart failure: Secondary | ICD-10-CM | POA: Diagnosis not present

## 2023-12-31 DIAGNOSIS — E7800 Pure hypercholesterolemia, unspecified: Secondary | ICD-10-CM | POA: Diagnosis not present

## 2023-12-31 DIAGNOSIS — N1832 Chronic kidney disease, stage 3b: Secondary | ICD-10-CM | POA: Diagnosis not present

## 2023-12-31 DIAGNOSIS — M5442 Lumbago with sciatica, left side: Secondary | ICD-10-CM | POA: Diagnosis not present

## 2023-12-31 DIAGNOSIS — I130 Hypertensive heart and chronic kidney disease with heart failure and stage 1 through stage 4 chronic kidney disease, or unspecified chronic kidney disease: Secondary | ICD-10-CM | POA: Diagnosis not present

## 2024-01-08 DIAGNOSIS — M4807 Spinal stenosis, lumbosacral region: Secondary | ICD-10-CM | POA: Diagnosis not present

## 2024-01-08 DIAGNOSIS — I130 Hypertensive heart and chronic kidney disease with heart failure and stage 1 through stage 4 chronic kidney disease, or unspecified chronic kidney disease: Secondary | ICD-10-CM | POA: Diagnosis not present

## 2024-01-08 DIAGNOSIS — M5442 Lumbago with sciatica, left side: Secondary | ICD-10-CM | POA: Diagnosis not present

## 2024-01-08 DIAGNOSIS — I5032 Chronic diastolic (congestive) heart failure: Secondary | ICD-10-CM | POA: Diagnosis not present

## 2024-01-08 DIAGNOSIS — E7800 Pure hypercholesterolemia, unspecified: Secondary | ICD-10-CM | POA: Diagnosis not present

## 2024-01-08 DIAGNOSIS — M21372 Foot drop, left foot: Secondary | ICD-10-CM | POA: Diagnosis not present

## 2024-01-08 DIAGNOSIS — E1122 Type 2 diabetes mellitus with diabetic chronic kidney disease: Secondary | ICD-10-CM | POA: Diagnosis not present

## 2024-01-08 DIAGNOSIS — N1832 Chronic kidney disease, stage 3b: Secondary | ICD-10-CM | POA: Diagnosis not present

## 2024-01-13 DIAGNOSIS — N62 Hypertrophy of breast: Secondary | ICD-10-CM | POA: Diagnosis not present

## 2024-01-13 DIAGNOSIS — M4807 Spinal stenosis, lumbosacral region: Secondary | ICD-10-CM | POA: Diagnosis not present

## 2024-01-13 DIAGNOSIS — E1122 Type 2 diabetes mellitus with diabetic chronic kidney disease: Secondary | ICD-10-CM | POA: Diagnosis not present

## 2024-01-13 DIAGNOSIS — M21372 Foot drop, left foot: Secondary | ICD-10-CM | POA: Diagnosis not present

## 2024-01-13 DIAGNOSIS — N1832 Chronic kidney disease, stage 3b: Secondary | ICD-10-CM | POA: Diagnosis not present

## 2024-01-13 DIAGNOSIS — I130 Hypertensive heart and chronic kidney disease with heart failure and stage 1 through stage 4 chronic kidney disease, or unspecified chronic kidney disease: Secondary | ICD-10-CM | POA: Diagnosis not present

## 2024-01-13 DIAGNOSIS — I5032 Chronic diastolic (congestive) heart failure: Secondary | ICD-10-CM | POA: Diagnosis not present

## 2024-01-13 DIAGNOSIS — M5442 Lumbago with sciatica, left side: Secondary | ICD-10-CM | POA: Diagnosis not present

## 2024-01-19 DIAGNOSIS — E1122 Type 2 diabetes mellitus with diabetic chronic kidney disease: Secondary | ICD-10-CM | POA: Diagnosis not present

## 2024-01-19 DIAGNOSIS — M4807 Spinal stenosis, lumbosacral region: Secondary | ICD-10-CM | POA: Diagnosis not present

## 2024-01-19 DIAGNOSIS — N1832 Chronic kidney disease, stage 3b: Secondary | ICD-10-CM | POA: Diagnosis not present

## 2024-01-19 DIAGNOSIS — M21372 Foot drop, left foot: Secondary | ICD-10-CM | POA: Diagnosis not present

## 2024-01-19 DIAGNOSIS — I130 Hypertensive heart and chronic kidney disease with heart failure and stage 1 through stage 4 chronic kidney disease, or unspecified chronic kidney disease: Secondary | ICD-10-CM | POA: Diagnosis not present

## 2024-01-19 DIAGNOSIS — M5442 Lumbago with sciatica, left side: Secondary | ICD-10-CM | POA: Diagnosis not present

## 2024-01-19 DIAGNOSIS — I5032 Chronic diastolic (congestive) heart failure: Secondary | ICD-10-CM | POA: Diagnosis not present

## 2024-01-19 DIAGNOSIS — N62 Hypertrophy of breast: Secondary | ICD-10-CM | POA: Diagnosis not present

## 2024-01-25 DIAGNOSIS — I130 Hypertensive heart and chronic kidney disease with heart failure and stage 1 through stage 4 chronic kidney disease, or unspecified chronic kidney disease: Secondary | ICD-10-CM | POA: Diagnosis not present

## 2024-01-25 DIAGNOSIS — M4807 Spinal stenosis, lumbosacral region: Secondary | ICD-10-CM | POA: Diagnosis not present

## 2024-01-25 DIAGNOSIS — M21372 Foot drop, left foot: Secondary | ICD-10-CM | POA: Diagnosis not present

## 2024-01-25 DIAGNOSIS — M5442 Lumbago with sciatica, left side: Secondary | ICD-10-CM | POA: Diagnosis not present

## 2024-01-25 DIAGNOSIS — N1832 Chronic kidney disease, stage 3b: Secondary | ICD-10-CM | POA: Diagnosis not present

## 2024-01-25 DIAGNOSIS — I5032 Chronic diastolic (congestive) heart failure: Secondary | ICD-10-CM | POA: Diagnosis not present

## 2024-01-25 DIAGNOSIS — N62 Hypertrophy of breast: Secondary | ICD-10-CM | POA: Diagnosis not present

## 2024-01-25 DIAGNOSIS — E1122 Type 2 diabetes mellitus with diabetic chronic kidney disease: Secondary | ICD-10-CM | POA: Diagnosis not present

## 2024-02-02 DIAGNOSIS — M4807 Spinal stenosis, lumbosacral region: Secondary | ICD-10-CM | POA: Diagnosis not present

## 2024-02-02 DIAGNOSIS — E1122 Type 2 diabetes mellitus with diabetic chronic kidney disease: Secondary | ICD-10-CM | POA: Diagnosis not present

## 2024-02-02 DIAGNOSIS — N62 Hypertrophy of breast: Secondary | ICD-10-CM | POA: Diagnosis not present

## 2024-02-02 DIAGNOSIS — N1832 Chronic kidney disease, stage 3b: Secondary | ICD-10-CM | POA: Diagnosis not present

## 2024-02-02 DIAGNOSIS — I130 Hypertensive heart and chronic kidney disease with heart failure and stage 1 through stage 4 chronic kidney disease, or unspecified chronic kidney disease: Secondary | ICD-10-CM | POA: Diagnosis not present

## 2024-02-02 DIAGNOSIS — M21372 Foot drop, left foot: Secondary | ICD-10-CM | POA: Diagnosis not present

## 2024-02-02 DIAGNOSIS — I5032 Chronic diastolic (congestive) heart failure: Secondary | ICD-10-CM | POA: Diagnosis not present

## 2024-02-02 DIAGNOSIS — M5442 Lumbago with sciatica, left side: Secondary | ICD-10-CM | POA: Diagnosis not present

## 2024-02-10 DIAGNOSIS — I130 Hypertensive heart and chronic kidney disease with heart failure and stage 1 through stage 4 chronic kidney disease, or unspecified chronic kidney disease: Secondary | ICD-10-CM | POA: Diagnosis not present

## 2024-02-10 DIAGNOSIS — M5442 Lumbago with sciatica, left side: Secondary | ICD-10-CM | POA: Diagnosis not present

## 2024-02-10 DIAGNOSIS — N1832 Chronic kidney disease, stage 3b: Secondary | ICD-10-CM | POA: Diagnosis not present

## 2024-02-10 DIAGNOSIS — E1122 Type 2 diabetes mellitus with diabetic chronic kidney disease: Secondary | ICD-10-CM | POA: Diagnosis not present

## 2024-02-10 DIAGNOSIS — N62 Hypertrophy of breast: Secondary | ICD-10-CM | POA: Diagnosis not present

## 2024-02-10 DIAGNOSIS — M21372 Foot drop, left foot: Secondary | ICD-10-CM | POA: Diagnosis not present

## 2024-02-10 DIAGNOSIS — I5032 Chronic diastolic (congestive) heart failure: Secondary | ICD-10-CM | POA: Diagnosis not present

## 2024-02-10 DIAGNOSIS — M4807 Spinal stenosis, lumbosacral region: Secondary | ICD-10-CM | POA: Diagnosis not present

## 2024-02-16 DIAGNOSIS — I5032 Chronic diastolic (congestive) heart failure: Secondary | ICD-10-CM | POA: Diagnosis not present

## 2024-02-16 DIAGNOSIS — M4807 Spinal stenosis, lumbosacral region: Secondary | ICD-10-CM | POA: Diagnosis not present

## 2024-02-16 DIAGNOSIS — N62 Hypertrophy of breast: Secondary | ICD-10-CM | POA: Diagnosis not present

## 2024-02-16 DIAGNOSIS — M21372 Foot drop, left foot: Secondary | ICD-10-CM | POA: Diagnosis not present

## 2024-02-16 DIAGNOSIS — M5442 Lumbago with sciatica, left side: Secondary | ICD-10-CM | POA: Diagnosis not present

## 2024-02-16 DIAGNOSIS — N1832 Chronic kidney disease, stage 3b: Secondary | ICD-10-CM | POA: Diagnosis not present

## 2024-02-16 DIAGNOSIS — E1122 Type 2 diabetes mellitus with diabetic chronic kidney disease: Secondary | ICD-10-CM | POA: Diagnosis not present

## 2024-02-16 DIAGNOSIS — I130 Hypertensive heart and chronic kidney disease with heart failure and stage 1 through stage 4 chronic kidney disease, or unspecified chronic kidney disease: Secondary | ICD-10-CM | POA: Diagnosis not present

## 2024-05-05 DIAGNOSIS — E119 Type 2 diabetes mellitus without complications: Secondary | ICD-10-CM | POA: Diagnosis not present

## 2024-05-05 DIAGNOSIS — N183 Chronic kidney disease, stage 3 unspecified: Secondary | ICD-10-CM | POA: Diagnosis not present

## 2024-05-05 DIAGNOSIS — I5030 Unspecified diastolic (congestive) heart failure: Secondary | ICD-10-CM | POA: Diagnosis not present

## 2024-05-05 DIAGNOSIS — R54 Age-related physical debility: Secondary | ICD-10-CM | POA: Diagnosis not present

## 2024-05-05 DIAGNOSIS — K5901 Slow transit constipation: Secondary | ICD-10-CM | POA: Diagnosis not present

## 2024-05-05 DIAGNOSIS — E559 Vitamin D deficiency, unspecified: Secondary | ICD-10-CM | POA: Diagnosis not present

## 2024-05-05 DIAGNOSIS — I350 Nonrheumatic aortic (valve) stenosis: Secondary | ICD-10-CM | POA: Diagnosis not present

## 2024-05-05 DIAGNOSIS — I10 Essential (primary) hypertension: Secondary | ICD-10-CM | POA: Diagnosis not present

## 2024-05-05 DIAGNOSIS — M4807 Spinal stenosis, lumbosacral region: Secondary | ICD-10-CM | POA: Diagnosis not present

## 2024-05-05 DIAGNOSIS — D649 Anemia, unspecified: Secondary | ICD-10-CM | POA: Diagnosis not present

## 2024-05-05 DIAGNOSIS — Z79899 Other long term (current) drug therapy: Secondary | ICD-10-CM | POA: Diagnosis not present

## 2024-05-05 DIAGNOSIS — Z7409 Other reduced mobility: Secondary | ICD-10-CM | POA: Diagnosis not present

## 2024-05-24 DIAGNOSIS — D696 Thrombocytopenia, unspecified: Secondary | ICD-10-CM | POA: Diagnosis not present

## 2024-10-26 ENCOUNTER — Emergency Department (HOSPITAL_COMMUNITY)

## 2024-10-26 ENCOUNTER — Encounter (HOSPITAL_COMMUNITY): Payer: Self-pay | Admitting: Emergency Medicine

## 2024-10-26 ENCOUNTER — Inpatient Hospital Stay (HOSPITAL_COMMUNITY)
Admission: EM | Admit: 2024-10-26 | Discharge: 2024-10-31 | DRG: 178 | Disposition: A | Attending: Internal Medicine | Admitting: Internal Medicine

## 2024-10-26 ENCOUNTER — Other Ambulatory Visit: Payer: Self-pay

## 2024-10-26 DIAGNOSIS — Z809 Family history of malignant neoplasm, unspecified: Secondary | ICD-10-CM

## 2024-10-26 DIAGNOSIS — D649 Anemia, unspecified: Secondary | ICD-10-CM | POA: Diagnosis present

## 2024-10-26 DIAGNOSIS — Z823 Family history of stroke: Secondary | ICD-10-CM

## 2024-10-26 DIAGNOSIS — Z1152 Encounter for screening for COVID-19: Secondary | ICD-10-CM

## 2024-10-26 DIAGNOSIS — E785 Hyperlipidemia, unspecified: Secondary | ICD-10-CM | POA: Diagnosis present

## 2024-10-26 DIAGNOSIS — Z87891 Personal history of nicotine dependence: Secondary | ICD-10-CM

## 2024-10-26 DIAGNOSIS — M549 Dorsalgia, unspecified: Secondary | ICD-10-CM | POA: Diagnosis present

## 2024-10-26 DIAGNOSIS — Z66 Do not resuscitate: Secondary | ICD-10-CM | POA: Diagnosis present

## 2024-10-26 DIAGNOSIS — R1312 Dysphagia, oropharyngeal phase: Secondary | ICD-10-CM | POA: Diagnosis present

## 2024-10-26 DIAGNOSIS — R32 Unspecified urinary incontinence: Secondary | ICD-10-CM | POA: Diagnosis present

## 2024-10-26 DIAGNOSIS — Z888 Allergy status to other drugs, medicaments and biological substances status: Secondary | ICD-10-CM

## 2024-10-26 DIAGNOSIS — I5022 Chronic systolic (congestive) heart failure: Secondary | ICD-10-CM | POA: Diagnosis present

## 2024-10-26 DIAGNOSIS — Z952 Presence of prosthetic heart valve: Secondary | ICD-10-CM

## 2024-10-26 DIAGNOSIS — G8929 Other chronic pain: Secondary | ICD-10-CM | POA: Diagnosis present

## 2024-10-26 DIAGNOSIS — I10 Essential (primary) hypertension: Secondary | ICD-10-CM | POA: Diagnosis present

## 2024-10-26 DIAGNOSIS — I5032 Chronic diastolic (congestive) heart failure: Secondary | ICD-10-CM | POA: Diagnosis present

## 2024-10-26 DIAGNOSIS — J189 Pneumonia, unspecified organism: Secondary | ICD-10-CM | POA: Diagnosis present

## 2024-10-26 DIAGNOSIS — J690 Pneumonitis due to inhalation of food and vomit: Principal | ICD-10-CM | POA: Diagnosis present

## 2024-10-26 DIAGNOSIS — I110 Hypertensive heart disease with heart failure: Secondary | ICD-10-CM | POA: Diagnosis present

## 2024-10-26 DIAGNOSIS — Z79899 Other long term (current) drug therapy: Secondary | ICD-10-CM

## 2024-10-26 DIAGNOSIS — R410 Disorientation, unspecified: Principal | ICD-10-CM

## 2024-10-26 DIAGNOSIS — Z7982 Long term (current) use of aspirin: Secondary | ICD-10-CM

## 2024-10-26 LAB — CBC WITH DIFFERENTIAL/PLATELET
Abs Immature Granulocytes: 0.02 K/uL (ref 0.00–0.07)
Basophils Absolute: 0.1 K/uL (ref 0.0–0.1)
Basophils Relative: 1 %
Eosinophils Absolute: 0.1 K/uL (ref 0.0–0.5)
Eosinophils Relative: 2 %
HCT: 36.3 % — ABNORMAL LOW (ref 39.0–52.0)
Hemoglobin: 12 g/dL — ABNORMAL LOW (ref 13.0–17.0)
Immature Granulocytes: 0 %
Lymphocytes Relative: 17 %
Lymphs Abs: 1.3 K/uL (ref 0.7–4.0)
MCH: 33.1 pg (ref 26.0–34.0)
MCHC: 33.1 g/dL (ref 30.0–36.0)
MCV: 100 fL (ref 80.0–100.0)
Monocytes Absolute: 0.8 K/uL (ref 0.1–1.0)
Monocytes Relative: 11 %
Neutro Abs: 5 K/uL (ref 1.7–7.7)
Neutrophils Relative %: 69 %
Platelets: 201 K/uL (ref 150–400)
RBC: 3.63 MIL/uL — ABNORMAL LOW (ref 4.22–5.81)
RDW: 13.2 % (ref 11.5–15.5)
WBC: 7.3 K/uL (ref 4.0–10.5)
nRBC: 0 % (ref 0.0–0.2)

## 2024-10-26 LAB — COMPREHENSIVE METABOLIC PANEL WITH GFR
ALT: 24 U/L (ref 0–44)
AST: 27 U/L (ref 15–41)
Albumin: 4 g/dL (ref 3.5–5.0)
Alkaline Phosphatase: 77 U/L (ref 38–126)
Anion gap: 8 (ref 5–15)
BUN: 24 mg/dL — ABNORMAL HIGH (ref 8–23)
CO2: 27 mmol/L (ref 22–32)
Calcium: 9.2 mg/dL (ref 8.9–10.3)
Chloride: 106 mmol/L (ref 98–111)
Creatinine, Ser: 1.11 mg/dL (ref 0.61–1.24)
GFR, Estimated: >60 mL/min
Glucose, Bld: 122 mg/dL — ABNORMAL HIGH (ref 70–99)
Potassium: 4.4 mmol/L (ref 3.5–5.1)
Sodium: 140 mmol/L (ref 135–145)
Total Bilirubin: 0.7 mg/dL (ref 0.0–1.2)
Total Protein: 6.8 g/dL (ref 6.5–8.1)

## 2024-10-26 NOTE — ED Triage Notes (Signed)
 Patient presents from home (lives with family) due a cough with thick mucus for 5 days. EMS noted rhonchi in the lung bases. Family reports the patient has been altered today with little PO intake, as well speech changes which started a few days ago. A&O x 1. EMS stroke screen was negative. EMS reports urine had a foul smell and was cloudy. Per family, patient was found on the floor after a fall today. They are not sure if he hit his head, but he is not on blood thinners. EMS administered 250 ml LR.    EMS vitals: 78 HR 97% SPO2 on room air 170/100 BP 155 CBG 23 CAP

## 2024-10-26 NOTE — ED Provider Notes (Signed)
 " [place] EMERGENCY DEPARTMENT AT [facility] Provider Note   CSN: [id number] Arrival date & time: 10/26/24  2151     Patient presents with: Altered Mental Status  Level 5 caveat due to altered mental status Marcus Powers is a 89 y.o. male.  {Add pertinent medical, surgical, social history, OB history to YEP:67052} The history is provided by the patient. The history is limited by the condition of the patient.  Patient with previous history of hypertension, aortic stenosis presents for altered mental status.  Initial history was received from nursing notes from EMS.  They report patient has been altered over the past day with very poor intake.  He also reported increasing cough and congestion. Also concerned he may have a UTI. Patient was also found lying in the floor but unsure if he hit his head.  Patient is awake and alert but unable to answer any questions.    Past Medical History:  Diagnosis Date   Chronic diastolic heart failure (HCC)    Hyperlipidemia    Hypertension    S/P TAVR (transcatheter aortic valve replacement) 01/28/2022   s/p TAVR with a 26 mm Edwards S3UR via the TF approach by Dr. Verlin & Dr. Lucas   Severe aortic stenosis    Spinal stenosis     Prior to Admission medications  Medication Sig Start Date End Date Taking? Authorizing Provider  acetaminophen  (TYLENOL ) 650 MG CR tablet Take 650 mg by mouth daily as needed for pain.    [provider]  aspirin  81 MG chewable tablet Chew 1 tablet (81 mg total) by mouth daily. 03/21/22   Patwardhan, Newman PARAS, MD  carvedilol  (COREG ) 6.25 MG tablet Take 1 tablet (6.25 mg total) by mouth 2 (two) times daily with a meal. 06/26/22   Patwardhan, Manish J, MD  rosuvastatin  (CRESTOR ) 20 MG tablet Take 1 tablet (20 mg total) by mouth daily. 03/29/22   Singh, Prashant K, MD  torsemide  (DEMADEX ) 20 MG tablet Take 1 tablet (20 mg total) by mouth daily. 03/29/22   Dennise Lavada POUR, MD    Allergies:  Benazepril hcl    Review of Systems  Unable to perform ROS: Mental status change    Updated Vital Signs BP (!) 183/84 (BP Location: Right Arm)   Pulse 73   Temp 97.9 F (36.6 C) (Oral)   Resp 19   SpO2 100%   Physical Exam CONSTITUTIONAL elderly, disheveled HEAD: Normocephalic/atraumatic, no visible trauma EYES: EOMI/PERRL ENMT: Mucous membranes moist NECK: supple no meningeal signs CV: S1/S2 noted, no murmurs/rubs/gallops noted LUNGS: Coarse breath sounds bilaterally ABDOMEN: soft, nontender, no rebound or guarding, bowel sounds noted throughout abdomen GU: Wearing a diaper NEURO: Pt is awake/alert but is confused.  He will follow commands but does not answer questions appropriately.  He appears to be hallucinating he moves all 4 extremities, no focal weakness EXTREMITIES: pulses normal/equal, full ROM, no deformities SKIN: warm, color normal  (all labs ordered are listed, but only abnormal results are displayed) Labs Reviewed  COMPREHENSIVE METABOLIC PANEL WITH GFR - Abnormal; Notable for the following components:      Result Value   Glucose, Bld 122 (*)    BUN 24 (*)    All other components within normal limits  CBC WITH DIFFERENTIAL/PLATELET - Abnormal; Notable for the following components:   RBC 3.63 (*)    Hemoglobin 12.0 (*)    HCT 36.3 (*)    All other components within normal limits  CULTURE, BLOOD (ROUTINE X  2)  CULTURE, BLOOD (ROUTINE X 2)  RESP PANEL BY RT-PCR (RSV, FLU A&B, COVID)  RVPGX2  URINALYSIS, W/ REFLEX TO CULTURE (INFECTION SUSPECTED)  I-STAT CG4 LACTIC ACID, ED  CBG MONITORING, ED    EKG: EKG Interpretation Date/Time:  Wednesday October 26 2024 23:23:49 EST Ventricular Rate:  77 PR Interval:  175 QRS Duration:  84 QT Interval:  414 QTC Calculation: 469 R Axis:   38  Text Interpretation: Sinus rhythm Atrial premature complex Probable left atrial enlargement Interpretation limited secondary to artifact Confirmed by Midge Golas  ([id number]) on 10/26/2024 11:30:18 PM  Radiology: [facility] Chest Port 1 View Result Date: 10/26/2024 CLINICAL DATA:  Possible sepsis EXAM: PORTABLE CHEST 1 VIEW COMPARISON:  03/27/2022 FINDINGS: No focal opacity or pleural effusion. Valve prosthesis. Stable cardiomediastinal silhouette with aortic atherosclerosis. No pneumothorax IMPRESSION: No active disease. Electronically Signed   By: Luke Bun M.D.   On: 10/26/2024 23:27    {Document cardiac monitor, telemetry assessment procedure when appropriate:32947} Procedures   Medications Ordered in the ED - No data to display    {Click here for ABCD2, HEART and other calculators REFRESH Note before signing:1}                              Medical Decision Making Amount and/or Complexity of Data Reviewed Labs: ordered. Radiology: ordered.   This patient presents to the ED for concern of altered mental status, this involves an extensive number of treatment options, and is a complaint that carries with it a high risk of complications and morbidity.  The differential diagnosis includes but is not limited to CVA, intracranial hemorrhage, acute coronary syndrome, renal failure, urinary tract infection, electrolyte disturbance, pneumonia    Comorbidities that complicate the patient evaluation: Patients presentation is complicated by their history of hypertension, aortic stenosis  Social Determinants of Health: Patients lives with family  increases the complexity of managing their presentation  Additional history obtained: Records reviewed neurology notes reviewed  Lab Tests: I Ordered, and personally interpreted labs.  The pertinent results include:  ***  Imaging Studies ordered: I ordered imaging studies including CT scan head and X-ray chest  I independently visualized and interpreted imaging which showed *** I agree with the radiologist interpretation  Cardiac Monitoring: The patient was maintained on a cardiac monitor.  I personally  viewed and interpreted the cardiac monitor which showed an underlying rhythm of:  {cardiac monitor:26849}  Medicines ordered and prescription drug management: I ordered medication including ***  for ***  Reevaluation of the patient after these medicines showed that the patient    {resolved/improved/worsened:23923::improved}  Test Considered: Patient is low risk / negative by ***, therefore do not feel that *** is indicated.  Critical Interventions:  ***  Consultations Obtained: I requested consultation with the {consultation:26851}, and discussed  findings as well as pertinent plan - they recommend: ***  Reevaluation: After the interventions noted above, I reevaluated the patient and found that they have :{resolved/improved/worsened:23923::improved}  Complexity of problems addressed: Patients presentation is most consistent with  acute presentation with potential threat to life or bodily function  Disposition: After consideration of the diagnostic results and the patients response to treatment,  I feel that the patent would benefit from {disposition:26850}.     {Document critical care time when appropriate  Document review of labs and clinical decision tools ie CHADS2VASC2, etc  Document your independent review of radiology images and any outside records  Document  your discussion with family members, caretakers and with consultants  Document social determinants of health affecting pt's care  Document your decision making why or why not admission, treatments were needed:32947:::1}   Final diagnoses:  None    ED Discharge Orders     None        "

## 2024-10-27 ENCOUNTER — Emergency Department (HOSPITAL_COMMUNITY)

## 2024-10-27 ENCOUNTER — Observation Stay (HOSPITAL_COMMUNITY)

## 2024-10-27 DIAGNOSIS — J189 Pneumonia, unspecified organism: Secondary | ICD-10-CM | POA: Diagnosis not present

## 2024-10-27 LAB — URINALYSIS, W/ REFLEX TO CULTURE (INFECTION SUSPECTED)
Bacteria, UA: NONE SEEN
Bilirubin Urine: NEGATIVE
Glucose, UA: NEGATIVE mg/dL
Hgb urine dipstick: NEGATIVE
Ketones, ur: NEGATIVE mg/dL
Leukocytes,Ua: NEGATIVE
Nitrite: NEGATIVE
Protein, ur: NEGATIVE mg/dL
Specific Gravity, Urine: 1.018 (ref 1.005–1.030)
pH: 5 (ref 5.0–8.0)

## 2024-10-27 LAB — I-STAT CG4 LACTIC ACID, ED: Lactic Acid, Venous: 0.7 mmol/L (ref 0.5–1.9)

## 2024-10-27 LAB — RESP PANEL BY RT-PCR (RSV, FLU A&B, COVID)  RVPGX2
Influenza A by PCR: NEGATIVE
Influenza B by PCR: NEGATIVE
Resp Syncytial Virus by PCR: NEGATIVE
SARS Coronavirus 2 by RT PCR: NEGATIVE

## 2024-10-27 LAB — GLUCOSE, CAPILLARY: Glucose-Capillary: 104 mg/dL — ABNORMAL HIGH (ref 70–99)

## 2024-10-27 MED ORDER — PROCHLORPERAZINE EDISYLATE 10 MG/2ML IJ SOLN: 5.0000 mg | Freq: Four times a day (QID) | INTRAMUSCULAR | Status: DC | PRN

## 2024-10-27 MED ORDER — IPRATROPIUM-ALBUTEROL 0.5-2.5 (3) MG/3ML IN SOLN
3.0000 mL | Freq: Four times a day (QID) | RESPIRATORY_TRACT | Status: DC
  Administered 2024-10-27 (×4): 3 mL via RESPIRATORY_TRACT
  Filled 2024-10-27 (×4): qty 3

## 2024-10-27 MED ORDER — ALBUTEROL SULFATE (2.5 MG/3ML) 0.083% IN NEBU
2.5000 mg | INHALATION_SOLUTION | RESPIRATORY_TRACT | Status: DC | PRN
  Administered 2024-10-30: 2.5 mg via RESPIRATORY_TRACT
  Filled 2024-10-27: qty 3

## 2024-10-27 MED ORDER — ACETAMINOPHEN 325 MG PO TABS: 650.0000 mg | ORAL_TABLET | Freq: Four times a day (QID) | ORAL | Status: DC | PRN

## 2024-10-27 MED ORDER — CARVEDILOL 6.25 MG PO TABS
6.2500 mg | ORAL_TABLET | Freq: Two times a day (BID) | ORAL | Status: DC
  Administered 2024-10-27 – 2024-10-31 (×5): 6.25 mg via ORAL
  Filled 2024-10-27 (×7): qty 1

## 2024-10-27 MED ORDER — ASPIRIN 81 MG PO TBEC
81.0000 mg | DELAYED_RELEASE_TABLET | Freq: Every day | ORAL | Status: DC
  Administered 2024-10-27 – 2024-10-31 (×4): 81 mg via ORAL
  Filled 2024-10-27 (×5): qty 1

## 2024-10-27 MED ORDER — DEXTROSE 5 % IV SOLN: INTRAVENOUS | Status: AC

## 2024-10-27 MED ORDER — EZETIMIBE 10 MG PO TABS
10.0000 mg | ORAL_TABLET | Freq: Every day | ORAL | Status: DC
  Administered 2024-10-27 – 2024-10-31 (×4): 10 mg via ORAL
  Filled 2024-10-27 (×5): qty 1

## 2024-10-27 MED ORDER — ACETAMINOPHEN 650 MG RE SUPP: 650.0000 mg | Freq: Four times a day (QID) | RECTAL | Status: DC | PRN

## 2024-10-27 MED ORDER — AZITHROMYCIN 250 MG PO TABS
500.0000 mg | ORAL_TABLET | Freq: Once | ORAL | Status: AC
  Administered 2024-10-27: 500 mg via ORAL
  Filled 2024-10-27: qty 2

## 2024-10-27 MED ORDER — SODIUM CHLORIDE 0.9 % IV SOLN
1.0000 g | Freq: Once | INTRAVENOUS | Status: AC
  Administered 2024-10-27: 1 g via INTRAVENOUS
  Filled 2024-10-27: qty 10

## 2024-10-27 MED ORDER — ENOXAPARIN SODIUM 40 MG/0.4ML IJ SOSY
40.0000 mg | PREFILLED_SYRINGE | INTRAMUSCULAR | Status: DC
  Administered 2024-10-27 – 2024-10-30 (×4): 40 mg via SUBCUTANEOUS
  Filled 2024-10-27 (×4): qty 0.4

## 2024-10-27 MED ORDER — ONDANSETRON HCL 4 MG/2ML IJ SOLN: 4.0000 mg | Freq: Four times a day (QID) | INTRAMUSCULAR | Status: DC | PRN

## 2024-10-27 MED ORDER — HALOPERIDOL LACTATE 5 MG/ML IJ SOLN
2.5000 mg | Freq: Four times a day (QID) | INTRAMUSCULAR | Status: DC | PRN
  Administered 2024-10-27 – 2024-10-28 (×2): 2.5 mg via INTRAVENOUS
  Filled 2024-10-27 (×2): qty 1

## 2024-10-27 MED ORDER — GABAPENTIN 100 MG PO CAPS
100.0000 mg | ORAL_CAPSULE | Freq: Two times a day (BID) | ORAL | Status: DC
  Administered 2024-10-28 – 2024-10-31 (×5): 100 mg via ORAL
  Filled 2024-10-27 (×8): qty 1

## 2024-10-27 MED ORDER — ROSUVASTATIN CALCIUM 10 MG PO TABS
20.0000 mg | ORAL_TABLET | Freq: Every day | ORAL | Status: DC
  Administered 2024-10-27 – 2024-10-31 (×3): 20 mg via ORAL
  Filled 2024-10-27 (×5): qty 2

## 2024-10-27 MED ORDER — METOPROLOL TARTRATE 5 MG/5ML IV SOLN
2.5000 mg | Freq: Three times a day (TID) | INTRAVENOUS | Status: DC | PRN
  Administered 2024-10-27 – 2024-10-28 (×3): 2.5 mg via INTRAVENOUS
  Filled 2024-10-27 (×3): qty 5

## 2024-10-27 MED ORDER — SODIUM CHLORIDE 0.9 % IV SOLN
3.0000 g | Freq: Three times a day (TID) | INTRAVENOUS | Status: DC
  Administered 2024-10-27 – 2024-10-31 (×13): 3 g via INTRAVENOUS
  Filled 2024-10-27 (×13): qty 8

## 2024-10-27 NOTE — Consult Note (Signed)
 "                                                  Palliative Care Consult Note                                  Date: 10/27/2024   Patient Name: Samyak Sackmann  DOB: 06/22/1928  MRN: [id number]  Age / Sex: 89 y.o., male  PCP: Charlott Dorn LABOR, MD Referring Physician: Celinda Alm Lot, MD  Reason for Consultation: Establishing goals of care  HPI/Patient Profile: 89 y.o. male  with past medical history of chronic systolic heart failure, severe aortic stenosis status post TAVR, spinal stenosis and chronic back pain, hyperlipidemia, and hypertension who presented to the ED on 10/26/2024 with cough and altered mental status.  He is admitted with community-acquired pneumonia  Palliative Medicine has been consulted for goals of care discussions and complex medical decision making.   Clinical Assessment and Goals of Care:   Extensive chart review has been completed including labs, vital signs, imaging, progress/consult notes, orders, medications and available advance directive documents.    Update received from RN. Patient assessed. She reports he has been confused and trying to get out of bed. IV Haldol  was administered at 1651 and patient is currently sleeping.   I spoke with his daughter Kyra by phone to discuss diagnosis, prognosis, GOC, EOL wishes, disposition, and options.  I introduced Palliative Medicine as specialized medical care for people living with serious illness. It focuses on providing relief from the symptoms and stress of a serious illness.   Created space and opportunity for daughter to express thoughts and feelings regarding current medical situation. Values and goals of care were attempted to be elicited.   Functional Status: Patient lives with daughter and son-in-law. He is ambulatory, but spends a lot of time in bed due to back pain. He needs assistance with bathing and dressing. He is incontinent of bowel/bladder and wears adult diapers.   Discussion: We  discussed patient's current illness and what it means in the larger context of his ongoing co-morbidities. Current clinical status was reviewed.   Discussed that patient is admitted with pneumonia, likely aspiration. Discussed that patient has irreversible dysphagia and will have ongoing risk for aspiration. Reviewed results of MBS study and current recommendations per SLP.  Kyra shares that her father has cognitive impairment, likely dementia (per his PCP), although a formal diagnosis has not been made. We discussed dementia and its natural trajectory as a progressive and life-limiting illness. Discussed specific indicators of dementia including decreased mobility, impaired swallowing, incontinence, and behavioral disturbances.   Kyra acknowledges that her father's health has been gradually declining for awhile. She has been the primary caregiver for both of her parents. Her mother recently died on 2024/10/06. We discussed the difficulties of caring for loved ones as their health is declining.  Kyra shares that her mother was receiving hospice services at home (with [facility]) , and this was a positive experience. She feels she will know when it is time for her father to transition to hospice care. For now, she wants to continue supportive care and to treat the treatable.    Discussed code status and encouraged consideration of DNR/DNI status understanding evidence-based poor outcomes in similar hospitalized patients,  as the cause of cardiac arrest would likely be associated with advanced illness rather than a reversible condition. Kyra strongly agrees with DNR/DNI status.    Discussed the importance of continued conversation with the medical team regarding overall plan of care. Questions and concerns addressed. Emotional support provided.    Review of Systems  Unable to perform ROS   Objective:   Primary Diagnoses: Present on Admission:  CAP (community acquired pneumonia)   Hyperlipidemia  Essential hypertension  Normocytic anemia  Chronic heart failure with preserved ejection fraction [facility])   Physical Exam Vitals reviewed.  Constitutional:      General: He is not in acute distress.    Comments: Frail and chronically ill-appearing  Pulmonary:     Effort: Pulmonary effort is normal.  Neurological:     Mental Status: He is lethargic and confused.  Psychiatric:        Cognition and Memory: Memory is impaired.    Palliative Assessment/Data: PPS 40%     Assessment & Plan:   SUMMARY OF RECOMMENDATIONS   Code status changed to DNR/DNI Continue supportive care, treat the treatable Daughter is familiar with hospice services and feels she will know when it is time to make this transition Ongoing palliative support   Primary Decision Maker: NEXT OF KIN - daughter Kyra (she is patient's only child)  Existing Vynca/ACP Documentation: None  Prognosis:  Unable to determine  Discharge Planning:  To Be Determined    Thank you for allowing us  to participate in the care of Amadeo Coke   I personally spent a total of 75 minutes in the care of the patient today including preparing to see the patient, getting/reviewing separately obtained history, performing a medically appropriate exam/evaluation, counseling and educating, referring and communicating with other health care professionals, and documenting clinical information in the EHR.   Signed by: Recardo Loll, NP Palliative Medicine Team  Team Phone # [phone]  For individual providers, please see AMION              \ "

## 2024-10-27 NOTE — Progress Notes (Signed)
 Modified Barium Swallow Study  Patient Details  Name: Marcus Powers MRN: [id number] Date of Birth: [birth year]  Today's Date: 10/27/2024  Modified Barium Swallow completed.  Full report located under Chart Review in the Imaging Section.  History of Present Illness 89 yo male admitted from home 1/21 with AMS, decreased PO intake, cough, congestion. Pt found lying on the floor. CXR: no active disease, CTChest: possible aspiration. CTHead: no acute abnormality. SLE ordered 2023 but not completed. PMH: HTN, aortic stenosis, chronic diastolic heart failure, HLD, s/p TAVR (2023). Lungs coarse, afebrile.   Clinical Impression Pt exhibits moderate oropharyngeal dysphagia, primarily impacted by efficiency. This is suspected to be chronic in nature due to protruding extensions from his cervical spine and a prominent cricopharyngeus that limit bolus transit through the pharynx. In addition to reduced BOT retraction and incomplete epiglottic inversion, this leads to diffuse pharyngeal residue. While there is penetration during the swallow with thin and nectar thick liquids, this increases in volume and progresses to the vocal folds after the swallow (PAS 5). As he continues swallowing and thus, more residue accumulates and spills over the epiglottis, trace aspiration occurs (PAS 7). Cueing to use multiple swallows was effective in clearing the majority of pharyngeal residue when pt was attentive and able to follow commands. Thickened liquids significantly increase the amount of residue and while aspiration of nectar or honey thick liquids was not visualized, anticipate occurrence after the swallow throughout the course of a meal. Aspiration of thicker materials is thought to be more harmful to pulmonary health. Recommend he resume regular diet with thin liquids. Discussed with pt's family and MD. Encourage mobility and frequent oral care as well as full supervision for meals to cue pt to swallow multiple times.    DIGEST Swallow Severity Rating*  Safety: 2  Efficiency: 3  Overall Pharyngeal Swallow Severity: 2 (moderate) 1: mild; 2: moderate; 3: severe; 4: profound  *The Dynamic Imaging Grade of Swallowing Toxicity is standardized for the head and neck cancer population, however, demonstrates promising clinical applications across populations to standardize the clinical rating of pharyngeal swallow safety and severity.   Factors that may increase risk of adverse event in presence of aspiration Noe & Lianne 2021): Frail or deconditioned  Swallow Evaluation Recommendations Recommendations: PO diet PO Diet Recommendation: Regular;Thin liquids (Level 0) Liquid Administration via: Cup;Straw Medication Administration: Whole meds with puree Supervision: Full assist for feeding;Full supervision/cueing for swallowing strategies Swallowing strategies  : Minimize environmental distractions;Slow rate;Small bites/sips Postural changes: Position pt fully upright for meals Oral care recommendations: Oral care BID (2x/day);Oral care before PO Recommended consults: Consider Palliative care    Damien Blumenthal, M.A., CCC-SLP Speech Language Pathology, Acute Rehabilitation Services  Secure Chat preferred [phone]  10/27/2024,12:07 PM

## 2024-10-27 NOTE — Evaluation (Signed)
 Clinical/Bedside Swallow Evaluation Patient Details  Name: Marcus Powers MRN: [id number] Date of Birth: 04-30-1928  Today's Date: 10/27/2024 Time: SLP Start Time (ACUTE ONLY): 0900 SLP Stop Time (ACUTE ONLY): 0925 SLP Time Calculation (min) (ACUTE ONLY): 25 min  Past Medical History:  Past Medical History:  Diagnosis Date   Chronic diastolic heart failure (HCC)    Hyperlipidemia    Hypertension    S/P TAVR (transcatheter aortic valve replacement) 01/28/2022   s/p TAVR with a 26 mm Edwards S3UR via the TF approach by Dr. Verlin & Dr. Lucas   Severe aortic stenosis    Spinal stenosis    Past Surgical History:  Past Surgical History:  Procedure Laterality Date   BIOPSY BREAST     EYE SURGERY Bilateral    RIGHT HEART CATH AND CORONARY ANGIOGRAPHY N/A 12/13/2021   Procedure: RIGHT HEART CATH AND CORONARY ANGIOGRAPHY;  Surgeon: Elmira Newman PARAS, MD;  Location: [facility] INVASIVE CV LAB;  Service: Cardiovascular;  Laterality: N/A;   TEE WITHOUT CARDIOVERSION N/A 01/28/2022   Procedure: TRANSESOPHAGEAL ECHOCARDIOGRAM (TEE);  Surgeon: Verlin Lonni BIRCH, MD;  Location: [facility] OR;  Service: Open Heart Surgery;  Laterality: N/A;   TONSILLECTOMY     TRANSCATHETER AORTIC VALVE REPLACEMENT, TRANSFEMORAL N/A 01/28/2022   Procedure: Transcatheter Aortic Valve Replacement, Transfemoral;  Surgeon: Verlin Lonni BIRCH, MD;  Location: [facility] OR;  Service: Open Heart Surgery;  Laterality: N/A;   ULTRASOUND GUIDANCE FOR VASCULAR ACCESS Bilateral 01/28/2022   Procedure: ULTRASOUND GUIDANCE FOR VASCULAR ACCESS;  Surgeon: Verlin Lonni BIRCH, MD;  Location[facility] OR;  Service: Open Heart Surgery;  Laterality: Bilateral;   HPI:  89yo male admitted from home 10/26/24 with AMS, decreased PO intake, cough, congestion. Pt found lying on the floor. CXR: no active disease, CTChest: possible aspiration. CTHead: no acute abnormality. SLE ordered 2023 but not completed. PMH: HTN, aortic stenosis, chronic diastolic heart  failurem HLD, s/p TAVR (2023). Lungs coarse, afebrile.    Assessment / Plan / Recommendation  Clinical Impression  PLAN: Continue NPO until MBS can be completed to identify least restrictive diet. Critical meds ok in puree.   Pt presents with suspected oropharyngeal dysphagia, based on poor dentition, extended oral prep of solid textures, delayed congested/nonproductive cough after PO presentations. Pt appears confused, and has difficulty maintaining attention to task. Given bedside presentation and CT concern for aspiration, recommend proceeding with MBS to objectively assess swallow function and safety, and identify least restrictive diet. Rec NPO except critical meds in puree in the interim.   Recommend consideration of Palliative Care consult to facilitate establishment of appropriate goals of care. Medical team informed.  SLP Visit Diagnosis: Dysphagia, unspecified (R13.10)    Aspiration Risk  Moderate aspiration risk;Risk for inadequate nutrition/hydration    Diet Recommendation NPO    Medication Administration: Whole meds with puree    Other Recommendations Oral Care Recommendations: Oral care QID     Swallow Evaluation Recommendations  MBS - further POC pending results   Assistance Recommended at Discharge  Frequent supervision  Functional Status Assessment Patient has had a recent decline in their functional status and/or demonstrates limited ability to make significant improvements in function in a reasonable and predictable amount of time   Frequency and Duration  (pending MBS)          Prognosis Prognosis for improved oropharyngeal function: Fair Barriers to Reach Goals: Cognitive deficits;Other (Comment) (advanced age)      Swallow Study   General Date of Onset: 10/26/24 HPI: 89yo male admitted from  home 10/26/24 with AMS, decreased PO intake, cough, congestion. Pt found lying on the floor. CXR: no active disease, CTChest: possible aspiration. CTHead: no acute  abnormality. SLE ordered 2023 but not completed. PMH: HTN, aortic stenosis, chronic diastolic heart failurem HLD, s/p TAVR (2023). Lungs coarse, afebrile. Type of Study: Bedside Swallow Evaluation Previous Swallow Assessment: none Diet Prior to this Study: NPO Temperature Spikes Noted: No Respiratory Status: Room air History of Recent Intubation: No Behavior/Cognition: Alert;Cooperative;Pleasant mood;Confused;Requires cueing;Distractible Oral Cavity Assessment: Within Functional Limits Oral Care Completed by SLP: Yes Oral Cavity - Dentition: Missing dentition Vision: Functional for self-feeding Self-Feeding Abilities: Able to feed self;Needs assist;Needs set up Patient Positioning: Upright in bed Baseline Vocal Quality: Normal Volitional Cough: Weak Volitional Swallow: Able to elicit    Oral/Motor/Sensory Function Overall Oral Motor/Sensory Function: Within functional limits   Ice Chips Ice chips: Within functional limits Presentation: Spoon   Thin Liquid Thin Liquid: Impaired Presentation: Cup;Self Fed;Straw Pharyngeal  Phase Impairments: Suspected delayed Swallow;Wet Vocal Quality;Cough - Delayed    Nectar Thick Nectar Thick Liquid: Not tested   Honey Thick Honey Thick Liquid: Not tested   Puree Puree: Within functional limits Presentation: Spoon   Solid     Solid: Impaired Oral Phase Functional Implications: Prolonged oral transit Pharyngeal Phase Impairments: Cough - Delayed;Multiple swallows;Suspected delayed Swallow     Marcus Powers B. Marcus, MSP, CCC-SLP Speech Language Pathologist  Marcus Powers 10/27/2024,9:35 AM

## 2024-10-27 NOTE — H&P (Signed)
 " History and Physical    Patient: Marcus Powers FMW:[id number] DOB: [birth year] DOA: 10/26/2024 DOS: the patient was seen and examined on 10/27/2024 PCP: Charlott Dorn LABOR, MD  Patient coming from: Home  Chief Complaint:  Chief Complaint  Patient presents with   Altered Mental Status   HPI: Marcus Powers is a 89 y.o. male with medical history significant of chronic systolic heart failure, hyperlipidemia, hypertension, severe arctic stenosis, history of TAVR, spinal stenosis who was brought from home due to cough for the last 5 days, but family got concerned as he became altered and later found him on the floor after a fall today so they called EMS.  He is unable to provide further history, but answers simple questions.  No headache, dyspnea, chest, back or abdominal pain at this time.  Labwork: Urinalysis was unremarkable.  CBC showed a white count of 7.3, hemoglobin 12.0 g/dL and platelets 798.  Lactic acid is normal.  CMP showed a glucose of 122 and a BUN of 24 mg/dL but was otherwise normal.  Coronavirus, influenza and RSV PCR test was negative.  Imaging: Portable 1 view chest radiograph with no active disease.  CT head without contrast no acute intracranial normality.  CT chest without contrast showing diffuse bronchial thickening with a layering fluid in the right main bronchus extending through the right lower lobe bronchus and occasional posterior basal subsegmental bronchial impactions, with consolidation.  Consider aspiration etiology.  Aspiration precautions are suggested.  Trace pleural effusions.  Mild paraseptal and centrilobular emphysema in the lung apices.  Stable 5 mm subpleural right upper lobe nodule with no routine follow-up recommended.  There is basilar scaring that is greater on the right side.  Chronic prominence of the pulmonary veins.  No acute edema.     ED course: Initial vital signs were temperature 97.9 F, pulse 73, respiration 19, BP 183/84 mmHg and O2 sat 100% on  room air.  Patient received azithromycin  500 mg p.o. and ceftriaxone  1 g IVPB.  Review of Systems: As mentioned in the history of present illness. All other systems reviewed and are negative. Past Medical History:  Diagnosis Date   Chronic diastolic heart failure (HCC)    Hyperlipidemia    Hypertension    S/P TAVR (transcatheter aortic valve replacement) 01/28/2022   s/p TAVR with a 26 mm Edwards S3UR via the TF approach by Dr. Verlin & Dr. Lucas   Severe aortic stenosis    Spinal stenosis    Past Surgical History:  Procedure Laterality Date   BIOPSY BREAST     EYE SURGERY Bilateral    RIGHT HEART CATH AND CORONARY ANGIOGRAPHY N/A 12/13/2021   Procedure: RIGHT HEART CATH AND CORONARY ANGIOGRAPHY;  Surgeon: Elmira Newman PARAS, MD;  Location: [facility]ASIVE [facility];  Service: Cardiovascular;  Laterality: N/A;   TEE WITHOUT CARDIOVERSION N/A 01/28/2022   Procedure: TRANSESOPHAGEAL ECHOCARDIOGRAM (TEE);  Surgeon: Verlin Lonni BIRCH, MD;  Location: [facility] OR;  Service: Open Heart Surgery;  Laterality: N/A;   TONSILLECTOMY     TRANSCATHETER AORTIC VALVE REPLACEMENT, TRANSFEMORAL N/A 01/28/2022   Procedure: Transcatheter Aortic Valve Replacement, Transfemoral;  Surgeon: Verlin Lonni BIRCH, MD;  Location: [facility] OR;  Service: Open Heart Surgery;  Laterality: N/A;   ULTRASOUND GUIDANCE FOR VASCULAR ACCESS Bilateral 01/28/2022   Procedure: ULTRASOUND GUIDANCE FOR VASCULAR ACCESS;  Surgeon: Verlin Lonni BIRCH, MD;  Location: [facility] OR;  Service: Open Heart Surgery;  Laterality: Bilateral;   Social History:  reports that he quit smoking about 57 years  ago. His smoking use included cigarettes. He started smoking about 62 years ago. He has a 1.3 pack-year smoking history. He has never used smokeless tobacco. He reports that he does not drink alcohol and does not use drugs.  Allergies[1]  Family History  Problem Relation Age of Onset   Cancer Mother    Stroke Father     Prior to Admission  medications  Medication Sig Start Date End Date Taking? Authorizing Provider  ezetimibe  (ZETIA ) 10 MG tablet Take 10 mg by mouth daily. 10/10/24  Yes [provider]  gabapentin  (NEURONTIN ) 100 MG capsule Take 100 mg by mouth 2 (two) times daily as needed. 08/02/24  Yes [provider]  acetaminophen  (TYLENOL ) 650 MG CR tablet Take 650 mg by mouth daily as needed for pain.    [provider]  aspirin  81 MG chewable tablet Chew 1 tablet (81 mg total) by mouth daily. 03/21/22   Patwardhan, Newman PARAS, MD  carvedilol  (COREG ) 6.25 MG tablet Take 1 tablet (6.25 mg total) by mouth 2 (two) times daily with a meal. 06/26/22   Patwardhan, Manish J, MD  rosuvastatin  (CRESTOR ) 20 MG tablet Take 1 tablet (20 mg total) by mouth daily. 03/29/22   Singh, Prashant K, MD  torsemide  (DEMADEX ) 20 MG tablet Take 1 tablet (20 mg total) by mouth daily. 03/29/22   Dennise Lavada POUR, MD    Physical Exam: Vitals:   10/27/24 0200 10/27/24 0300 10/27/24 0311 10/27/24 0738  BP:  (!) 202/153  (!) 204/91  Pulse:  84  79  Resp:  20  18  Temp: 98.9 F (37.2 C)  98.8 F (37.1 C) 98.3 F (36.8 C)  TempSrc:   Rectal Oral  SpO2:  100%  97%   Physical Exam Vitals and nursing note reviewed.  Constitutional:      General: He is awake. He is not in acute distress.    Appearance: He is ill-appearing.  HENT:     Head: Normocephalic.     Nose: No rhinorrhea.     Mouth/Throat:     Mouth: Mucous membranes are moist.  Eyes:     General: No scleral icterus.    Pupils: Pupils are equal, round, and reactive to light.  Neck:     Vascular: No JVD.  Cardiovascular:     Rate and Rhythm: Normal rate and regular rhythm.     Heart sounds: S1 normal and S2 normal.  Pulmonary:     Effort: Pulmonary effort is normal.     Breath sounds: Normal breath sounds. No wheezing, rhonchi or rales.  Abdominal:     General: Bowel sounds are normal. There is no distension.     Palpations: Abdomen is soft.     Tenderness:  There is no abdominal tenderness. There is no right CVA tenderness or left CVA tenderness.  Musculoskeletal:     Cervical back: Neck supple.     Right lower leg: No edema.     Left lower leg: No edema.  Skin:    General: Skin is warm and dry.  Neurological:     General: No focal deficit present.     Mental Status: He is alert and oriented to person, place, and time.  Psychiatric:        Mood and Affect: Mood normal.        Behavior: Behavior normal. Behavior is cooperative.     Data Reviewed:  Results are pending, will review when available.  01/18/2023 TTE IMPRESSIONS:   1.  Left ventricular ejection fraction, by estimation, is 55 to 60%. The  left ventricle has normal function. The left ventricle has no regional  wall motion abnormalities. There is moderate concentric left ventricular  hypertrophy. Left ventricular  diastolic parameters are consistent with Grade I diastolic dysfunction  (impaired relaxation).   2. Right ventricular systolic function is normal. The right ventricular  size is normal. There is normal pulmonary artery systolic pressure. The  estimated right ventricular systolic pressure is 24.7 mmHg.   3. Left atrial size was mildly dilated.   4. The mitral valve is normal in structure. Trivial mitral valve  regurgitation. No evidence of mitral stenosis.   5. S/p TAVR with 26 mm Edwards Sapien THV. No significant perivalvular  leakage. Mean gradient 9 mmHg with EOA 1.9 cm^2.   6. The inferior vena cava is normal in size with greater than 50%  respiratory variability, suggesting right atrial pressure of 3 mmHg.   EKG: Vent. rate 77 BPM  PR interval 175 ms  QRS duration 84 ms  QT/QTcB 414/469 ms  P-R-T axes 47 38 66  Sinus rhythm  Atrial premature complex  Probable left atrial enlargement  Assessment and Plan: Principal Problem:   CAP (community acquired pneumonia) Likely an aspiration pneumonia. Admit to telemetry/inpatient. As needed supplemental  oxygen. Scheduled and as needed bronchodilators. Begin Unasyn  3 g IVPB every 6 hours. Check strep pneumoniae urinary antigen. Check sputum Gram stain, culture and sensitivity. Follow-up blood culture and sensitivity. Follow-up CBC and chemistry in the morning. SLP consult and evaluation appreciated. - Will follow the recommendations.  Active Problems:   Chronic heart failure with preserved ejection fraction (HCC) Seems to be euvolemic. Torsemide  has been held. Continue carvedilol  6.25 mg p.o. twice daily.    Essential hypertension Continue beta-blocker as above.    Hyperlipidemia Continue ezetimibe  10 mg p.o. daily.    Normocytic anemia Monitor hematocrit and hemoglobin.    Advance Care Planning:   Code Status: Full Code   Consults:   Family Communication:   Severity of Illness: The appropriate patient status for this patient is OBSERVATION. Observation status is judged to be reasonable and necessary in order to provide the required intensity of service to ensure the patient's safety. The patient's presenting symptoms, physical exam findings, and initial radiographic and laboratory data in the context of their medical condition is felt to place them at decreased risk for further clinical deterioration. Furthermore, it is anticipated that the patient will be medically stable for discharge from the hospital within 2 midnights of admission.   Author: Alm Dorn Castor, MD 10/27/2024 9:19 AM  For on call review [email or web address].   This document was prepared using Dragon voice recognition software and may contain some unintended transcription errors.     [1]  Allergies Allergen Reactions   Benazepril Hcl Cough   "

## 2024-10-27 NOTE — Progress Notes (Signed)
" °  PT Cancellation Note  Patient Details Name: Marcus Powers MRN: [id number] DOB: [birth year]   Cancelled Treatment:    Reason Eval/Treat Not Completed: Patient at procedure or test/unavailable  Darice Potters PT Acute Rehabilitation Services Office [phone]  Potters Darice Norris 10/27/2024, 10:59 AM "

## 2024-10-27 NOTE — Plan of Care (Signed)
  Problem: Activity: Goal: Risk for activity intolerance will decrease Outcome: Progressing   Problem: Nutrition: Goal: Adequate nutrition will be maintained Outcome: Progressing   Problem: Coping: Goal: Level of anxiety will decrease Outcome: Progressing   Problem: Elimination: Goal: Will not experience complications related to bowel motility Outcome: Progressing   Problem: Pain Managment: Goal: General experience of comfort will improve and/or be controlled Outcome: Progressing   Problem: Skin Integrity: Goal: Risk for impaired skin integrity will decrease Outcome: Progressing

## 2024-10-28 DIAGNOSIS — Z7982 Long term (current) use of aspirin: Secondary | ICD-10-CM | POA: Diagnosis not present

## 2024-10-28 DIAGNOSIS — I110 Hypertensive heart disease with heart failure: Secondary | ICD-10-CM | POA: Diagnosis present

## 2024-10-28 DIAGNOSIS — E785 Hyperlipidemia, unspecified: Secondary | ICD-10-CM | POA: Diagnosis present

## 2024-10-28 DIAGNOSIS — R410 Disorientation, unspecified: Secondary | ICD-10-CM | POA: Diagnosis present

## 2024-10-28 DIAGNOSIS — R32 Unspecified urinary incontinence: Secondary | ICD-10-CM | POA: Diagnosis present

## 2024-10-28 DIAGNOSIS — D649 Anemia, unspecified: Secondary | ICD-10-CM | POA: Diagnosis present

## 2024-10-28 DIAGNOSIS — I5022 Chronic systolic (congestive) heart failure: Secondary | ICD-10-CM | POA: Diagnosis present

## 2024-10-28 DIAGNOSIS — Z66 Do not resuscitate: Secondary | ICD-10-CM | POA: Diagnosis present

## 2024-10-28 DIAGNOSIS — J690 Pneumonitis due to inhalation of food and vomit: Secondary | ICD-10-CM | POA: Diagnosis present

## 2024-10-28 DIAGNOSIS — Z888 Allergy status to other drugs, medicaments and biological substances status: Secondary | ICD-10-CM | POA: Diagnosis not present

## 2024-10-28 DIAGNOSIS — Z823 Family history of stroke: Secondary | ICD-10-CM | POA: Diagnosis not present

## 2024-10-28 DIAGNOSIS — Z952 Presence of prosthetic heart valve: Secondary | ICD-10-CM | POA: Diagnosis not present

## 2024-10-28 DIAGNOSIS — G8929 Other chronic pain: Secondary | ICD-10-CM | POA: Diagnosis present

## 2024-10-28 DIAGNOSIS — Z79899 Other long term (current) drug therapy: Secondary | ICD-10-CM | POA: Diagnosis not present

## 2024-10-28 DIAGNOSIS — Z809 Family history of malignant neoplasm, unspecified: Secondary | ICD-10-CM | POA: Diagnosis not present

## 2024-10-28 DIAGNOSIS — M549 Dorsalgia, unspecified: Secondary | ICD-10-CM | POA: Diagnosis present

## 2024-10-28 DIAGNOSIS — R1312 Dysphagia, oropharyngeal phase: Secondary | ICD-10-CM | POA: Diagnosis present

## 2024-10-28 DIAGNOSIS — Z1152 Encounter for screening for COVID-19: Secondary | ICD-10-CM | POA: Diagnosis not present

## 2024-10-28 DIAGNOSIS — Z87891 Personal history of nicotine dependence: Secondary | ICD-10-CM | POA: Diagnosis not present

## 2024-10-28 LAB — GLUCOSE, CAPILLARY
Glucose-Capillary: 110 mg/dL — ABNORMAL HIGH (ref 70–99)
Glucose-Capillary: 124 mg/dL — ABNORMAL HIGH (ref 70–99)
Glucose-Capillary: 94 mg/dL (ref 70–99)

## 2024-10-28 LAB — COMPREHENSIVE METABOLIC PANEL WITH GFR
ALT: 18 U/L (ref 0–44)
AST: 27 U/L (ref 15–41)
Albumin: 4 g/dL (ref 3.5–5.0)
Alkaline Phosphatase: 76 U/L (ref 38–126)
Anion gap: 13 (ref 5–15)
BUN: 15 mg/dL (ref 8–23)
CO2: 22 mmol/L (ref 22–32)
Calcium: 9.7 mg/dL (ref 8.9–10.3)
Chloride: 107 mmol/L (ref 98–111)
Creatinine, Ser: 0.98 mg/dL (ref 0.61–1.24)
GFR, Estimated: >60 mL/min
Glucose, Bld: 103 mg/dL — ABNORMAL HIGH (ref 70–99)
Potassium: 3.9 mmol/L (ref 3.5–5.1)
Sodium: 143 mmol/L (ref 135–145)
Total Bilirubin: 0.9 mg/dL (ref 0.0–1.2)
Total Protein: 7 g/dL (ref 6.5–8.1)

## 2024-10-28 LAB — CBC
HCT: 37 % — ABNORMAL LOW (ref 39.0–52.0)
Hemoglobin: 12 g/dL — ABNORMAL LOW (ref 13.0–17.0)
MCH: 32.3 pg (ref 26.0–34.0)
MCHC: 32.4 g/dL (ref 30.0–36.0)
MCV: 99.7 fL (ref 80.0–100.0)
Platelets: 190 K/uL (ref 150–400)
RBC: 3.71 MIL/uL — ABNORMAL LOW (ref 4.22–5.81)
RDW: 13.4 % (ref 11.5–15.5)
WBC: 9.3 K/uL (ref 4.0–10.5)
nRBC: 0 % (ref 0.0–0.2)

## 2024-10-28 MED ORDER — QUETIAPINE FUMARATE 25 MG PO TABS
12.5000 mg | ORAL_TABLET | Freq: Every day | ORAL | Status: DC
  Administered 2024-10-28 – 2024-10-30 (×3): 12.5 mg via ORAL
  Filled 2024-10-28 (×3): qty 1

## 2024-10-28 MED ORDER — ACETAMINOPHEN 500 MG PO TABS
1000.0000 mg | ORAL_TABLET | Freq: Three times a day (TID) | ORAL | Status: DC
  Administered 2024-10-28 – 2024-10-31 (×5): 1000 mg via ORAL
  Filled 2024-10-28 (×8): qty 2

## 2024-10-28 MED ORDER — DEXTROSE 5 % IV SOLN: INTRAVENOUS | Status: AC

## 2024-10-28 MED ORDER — IPRATROPIUM-ALBUTEROL 0.5-2.5 (3) MG/3ML IN SOLN
3.0000 mL | Freq: Three times a day (TID) | RESPIRATORY_TRACT | Status: DC
  Administered 2024-10-28 – 2024-10-29 (×4): 3 mL via RESPIRATORY_TRACT
  Filled 2024-10-28 (×5): qty 3

## 2024-10-28 NOTE — Care Management Obs Status (Signed)
 MEDICARE OBSERVATION STATUS NOTIFICATION   Patient Details  Name: Marcus Powers MRN: [id number] Date of Birth: 06-29-1928   Medicare Observation Status Notification Given:  Yes    Sonda Manuella Quill, RN 10/28/2024, 10:31 AM

## 2024-10-28 NOTE — Progress Notes (Signed)
 PT Cancellation Note  Patient Details Name: Marcus Powers MRN: [id number] DOB: [birth year]   Cancelled Treatment:    Reason Eval/Treat Not Completed: Other (comment) Attempted therapy but pt to lethargic/unarousable.  Pt had been combative and received haldol .  Will briefly open eyes but then back asleep and unable to participate.  Will f/u as able. Aliviana Burdell, PT Acute Rehab [facility] [phone]   Benjiman VEAR Mulberry 10/28/2024, 10:45 AM

## 2024-10-28 NOTE — Progress Notes (Signed)
 BP 179/111 RN administered 2.5mg  IV metoprolol  per the prn order.   Repeat BP 135/85  Rn notified Lavanda Horns, NP of the above information

## 2024-10-28 NOTE — TOC Initial Note (Signed)
 Transition of Care [facility]) - Initial/Assessment Note    Patient Details  Name: Marcus Powers MRN: [id number] Date of Birth: 1927/11/28  Transition of [facility]) CM/SW Contact:    Sonda Manuella Quill, RN Phone Number: 10/28/2024, 11:25 AM  Clinical Narrative:                 Pt oriented to person; spoke w/ dtr [name] [phone]); pt lives at home w/ family; she plans for him to return w/ family support at d/c; family will provide transportation; insurance/PCP verified; she denied pt experiencing SDOH risks; pt has walker and BSC; he does not have HH services or home oxygen; awaiting PT eval; his dtr said if HHPT is recc,she does not have agency preference; IP CM is following.  Expected Discharge Plan: Home/Self Care Barriers to Discharge: Continued Medical Work up   Patient Goals and CMS Choice Patient states their goals for this hospitalization and ongoing recovery are:: home     [place] ownership interest in [facility].provided to:: Adult Children    Expected Discharge Plan and Services   Discharge Planning Services: CM Consult   Living arrangements for the past 2 months: Single Family Home                 DME Arranged: N/A DME Agency: NA       HH Arranged: NA HH Agency: NA        Prior Living Arrangements/Services Living arrangements for the past 2 months: Single Family Home Lives with:: Adult Children Patient language and need for interpreter reviewed:: Yes Do you feel safe going back to the place where you live?: Yes      Need for Family Participation in Patient Care: Yes (Comment) Care giver support system in place?: Yes (comment) Current home services: DME (walker, BSC) Criminal Activity/Legal Involvement Pertinent to Current Situation/Hospitalization: No - Comment as needed  Activities of Daily Living      Permission Sought/Granted Permission sought to share information with : Case Manager Permission granted to share  information with : Yes, Verbal Permission Granted  Share Information with NAME: Case Manager     Permission granted to share info w Relationship: [name] (dtr) [phone]     Emotional Assessment Appearance:: Other (Comment Required (unable to assess) Attitude/Demeanor/Rapport: Unable to Assess Affect (typically observed): Unable to Assess Orientation: :  (unable to assess) Alcohol / Substance Use: Not Applicable Psych Involvement: No (comment)  Admission diagnosis:  Delirium [R41.0] CAP (community acquired pneumonia) [J18.9] Aspiration pneumonia of right lung, unspecified aspiration pneumonia type, unspecified part of lung (HCC) [J69.0] Patient Active Problem List   Diagnosis Date Noted   Aspiration pneumonia of right lung (HCC) 10/28/2024   CAP (community acquired pneumonia) 10/27/2024   Pain due to onychomycosis of toenails of both feet 04/20/2023   Transient ischemic attack 03/28/2022   Stroke-like symptoms 03/27/2022   AKI (acute kidney injury) 03/27/2022   S/P TAVR (transcatheter aortic valve replacement) 01/28/2022   Normocytic anemia 01/22/2022   Chronic heart failure with preserved ejection fraction (HCC) 12/13/2021   Hyperlipidemia    Severe aortic stenosis 04/22/2021   Essential hypertension 04/22/2021   PCP:  Charlott Dorn LABOR, MD Pharmacy:   [facility] [phone] [address], [place] - [address] AT [facility] [address] [facility] [address] [place] [address] Phone: [phone] Fax: [phone]  [facility] [address] [place] [address] Phone: [phone] Fax: [phone]  Social Drivers of Health (SDOH) Social History: SDOH Screenings   Food Insecurity: No Food Insecurity (10/28/2024)  Housing: Low Risk (10/28/2024)  Transportation Needs: No Transportation Needs (10/28/2024)  Utilities: Not At Risk (10/28/2024)  Tobacco Use: Medium Risk (10/26/2024)   SDOH  Interventions: Food Insecurity Interventions: Intervention Not Indicated, Inpatient TOC Housing Interventions: Intervention Not Indicated, Inpatient TOC Transportation Interventions: Intervention Not Indicated, Inpatient TOC Utilities Interventions: Intervention Not Indicated, Inpatient TOC   Readmission Risk Interventions     No data to display

## 2024-10-28 NOTE — Progress Notes (Signed)
 Patient's BP 153/89 HR 109 -113. Patient has a full body tremor. RN did not notice this tremor in the beginning of the shift. The NT sitting with the patient states she noticed a slight tremor earlier when he had an IV placed. Per telemetry tech, rhythm is Sinus tach. When RN and NT cleaned patient up earlier, his HR increased to the 120s.   RN notified Lavanda Horns, NP of the above information

## 2024-10-28 NOTE — Progress Notes (Signed)
 " PROGRESS NOTE    Marcus Powers  FMW:[id number] DOB: Jan 30, 1928 DOA: 10/26/2024 PCP: Charlott Dorn LABOR, MD  Brief Narrative: 89 yo male with a history of hypertension severe aortic stenosis TAVR for spinal stenosis chronic back pain chronic systolic heart failure admitted with cough and congestion.  At his baseline he is able to ambulate inside the house with a walker he is able to eat although they have noticed that he coughs more at dinner while eating.  He just lost his wife last month with dementia.  Since he has been sitting around more than before and complains of chronic back pain.  CT on admission was concerning for right main bronchus layering of fluid with right lower lobe as well as posterior basal subsegmental bronchial consolidation consistent with aspiration.  Patient has been seen by speech therapy and modified barium swallow has been done and recommending a regular diet with thin liquids.  Patient is DNR DNI.  Family was also concerned about if patient had a stroke as he was trying to eat at home he would take the food to the side of his face instead of to his mouth and some disorientation.  No focal weakness at home.  CT of the head this admission shows cortical atrophy with no acute changes.  MRI MRA in 2023 noted with the same changes.  Assessment & Plan:   Principal Problem:   CAP (community acquired pneumonia) Active Problems:   Chronic heart failure with preserved ejection fraction (HCC)   Essential hypertension   Hyperlipidemia   Normocytic anemia  #1 pneumonia likely aspiration, patient admitted with congestion and cough.  He was not hypoxic on admission.  No leukocytosis or lactic acidosis on admit Continue Unasyn  3 g IV Q6.  Appreciate speech consult.  Will change his diet to soft diet since he does not have his dentures.  He was unable to eat dinner last night due to chicken being hard and dry with no dentures.  Discussed with family. Continue neb treatments scheduled  since he is not going to ask for it. Encourage I-S when able.  #2 Chronic heart failure with preserved ejection fraction (HCC) Seems to be euvolemic. Torsemide  has been held.? Unclear if he was taking it at home.. Continue carvedilol  6.25 mg p.o. twice daily.    # 3 Essential hypertension Continue coreg      # 4 Hyperlipidemia Continue ezetimibe  10 mg p.o. daily.   # 5  Normocytic anemia Monitor hematocrit and hemoglobin.   Estimated body mass index is 23.3 kg/m as calculated from the following:   Height as of 04/23/23: 5' 5 (1.651 m).   Weight as of 04/23/23: 63.5 kg.  DVT prophylaxis: lovenox  Code Status: dnr Family Communication: dw daughter and SIL Disposition Plan:  Status is: Observation   Consultants: NONE  Procedures:MBS Antimicrobials UNASYN   Subjective:  Patient is resting in bed had a rough night incontinent of urine agitated with any movement or questions Objective: Vitals:   10/28/24 0347 10/28/24 0510 10/28/24 0632 10/28/24 0701  BP:  (!) 188/75 (!) 189/93 (!) 162/72  Pulse: (!) 102 (!) 125  82  Resp:   19   Temp:   99.5 F (37.5 C) 98.5 F (36.9 C)  TempSrc:  Oral    SpO2: 98% 99% 98% 98%   No intake or output data in the 24 hours ending 10/28/24 0849 There were no vitals filed for this visit.  Examination:  General exam: Appears restless sitter at bedside Respiratory system: Clear  to auscultation. Respiratory effort normal. Cardiovascular system: tachy Gastrointestinal system: Abdomen is nondistended, soft and nontender. No organomegaly or masses felt. Normal bowel sounds heard. Central nervous system: agitated. Extremities: no edema   Data Reviewed: I have personally reviewed following labs and imaging studies  CBC: Recent Labs  Lab 10/26/24 2303 10/28/24 0616  WBC 7.3 9.3  NEUTROABS 5.0  --   HGB 12.0* 12.0*  HCT 36.3* 37.0*  MCV 100.0 99.7  PLT 201 190   Basic Metabolic Panel: Recent Labs  Lab 10/26/24 2303 10/28/24 0616   NA 140 143  K 4.4 3.9  CL 106 107  CO2 27 22  GLUCOSE 122* 103*  BUN 24* 15  CREATININE 1.11 0.98  CALCIUM  9.2 9.7   GFR: CrCl cannot be calculated (Unknown ideal weight.). Liver Function Tests: Recent Labs  Lab 10/26/24 2303 10/28/24 0616  AST 27 27  ALT 24 18  ALKPHOS 77 76  BILITOT 0.7 0.9  PROT 6.8 7.0  ALBUMIN 4.0 4.0   No results for input(s): LIPASE, AMYLASE in the last 168 hours. No results for input(s): AMMONIA in the last 168 hours. Coagulation Profile: No results for input(s): INR, PROTIME in the last 168 hours. Cardiac Enzymes: No results for input(s): CKTOTAL, CKMB, CKMBINDEX, TROPONINI in the last 168 hours. BNP (last 3 results) No results for input(s): PROBNP in the last 8760 hours. HbA1C: No results for input(s): HGBA1C in the last 72 hours. CBG: Recent Labs  Lab 10/27/24 1620 10/28/24 0009 10/28/24 0614  GLUCAP 104* 124* 110*   Lipid Profile: No results for input(s): CHOL, HDL, LDLCALC, TRIG, CHOLHDL, LDLDIRECT in the last 72 hours. Thyroid  Function Tests: No results for input(s): TSH, T4TOTAL, FREET4, T3FREE, THYROIDAB in the last 72 hours. Anemia Panel: No results for input(s): VITAMINB12, FOLATE, FERRITIN, TIBC, IRON, RETICCTPCT in the last 72 hours. Sepsis Labs: Recent Labs  Lab 10/27/24 0120  LATICACIDVEN 0.7    Recent Results (from the past 240 hours)  Resp panel by RT-PCR (RSV, Flu A&B, Covid) Anterior Nasal Swab     Status: None   Collection Time: 10/27/24  1:05 AM   Specimen: Anterior Nasal Swab  Result Value Ref Range Status   SARS Coronavirus 2 by RT PCR NEGATIVE NEGATIVE Final    Comment: (NOTE) SARS-CoV-2 target nucleic acids are NOT DETECTED.  The SARS-CoV-2 RNA is generally detectable in upper respiratory specimens during the acute phase of infection. The lowest concentration of SARS-CoV-2 viral copies this assay can detect is 138 copies/mL. A negative result  does not preclude SARS-Cov-2 infection and should not be used as the sole basis for treatment or other patient management decisions. A negative result may occur with  improper specimen collection/handling, submission of specimen other than nasopharyngeal swab, presence of viral mutation(s) within the areas targeted by this assay, and inadequate number of viral copies(<138 copies/mL). A negative result must be combined with clinical observations, patient history, and epidemiological information. The expected result is Negative.  Fact Sheet for Patients:  bloggercourse.com  Fact Sheet for Healthcare Providers:  seriousbroker.it  This test is no t yet approved or cleared by the [place]  FDA and  has been authorized for detection and/or diagnosis of SARS-CoV-2 by FDA under an Emergency Use Authorization (EUA). This EUA will remain  in effect (meaning this test can be used) for the duration of the COVID-19 declaration under Section 564(b)(1) of the Act, 21 U.S.C.section 360bbb-3(b)(1), unless the authorization is terminated  or revoked sooner.  Influenza A by PCR NEGATIVE NEGATIVE Final   Influenza B by PCR NEGATIVE NEGATIVE Final    Comment: (NOTE) The Xpert Xpress SARS-CoV-2/FLU/RSV plus assay is intended as an aid in the diagnosis of influenza from Nasopharyngeal swab specimens and should not be used as a sole basis for treatment. Nasal washings and aspirates are unacceptable for Xpert Xpress SARS-CoV-2/FLU/RSV testing.  Fact Sheet for Patients: bloggercourse.com  Fact Sheet for Healthcare Providers: seriousbroker.it  This test is not yet approved or cleared by the [place]  FDA and has been authorized for detection and/or diagnosis of SARS-CoV-2 by FDA under an Emergency Use Authorization (EUA). This EUA will remain in effect (meaning this test can be used) for  the duration of the COVID-19 declaration under Section 564(b)(1) of the Act, 21 U.S.C. section 360bbb-3(b)(1), unless the authorization is terminated or revoked.     Resp Syncytial Virus by PCR NEGATIVE NEGATIVE Final    Comment: (NOTE) Fact Sheet for Patients: bloggercourse.com  Fact Sheet for Healthcare Providers: seriousbroker.it  This test is not yet approved or cleared by the [place]  FDA and has been authorized for detection and/or diagnosis of SARS-CoV-2 by FDA under an Emergency Use Authorization (EUA). This EUA will remain in effect (meaning this test can be used) for the duration of the COVID-19 declaration under Section 564(b)(1) of the Act, 21 U.S.C. section 360bbb-3(b)(1), unless the authorization is terminated or revoked.  Performed at [facility], [address] [place], [place] [address]   Blood Culture (routine x 2)     Status: None (Preliminary result)   Collection Time: 10/27/24  1:14 AM   Specimen: BLOOD  Result Value Ref Range Status   Specimen Description   Final    BLOOD LEFT ANTECUBITAL Performed at [facility], [address] [place], [place] [address]    Special Requests   Final    BOTTLES DRAWN AEROBIC AND ANAEROBIC Blood Culture results may not be optimal due to an inadequate volume of blood received in culture bottles Performed at [facility], [address], [place], [place] [address]    Culture   Final    NO GROWTH < 12 HOURS Performed at [facility], [address], [place], [place] [address]    Report Status PENDING  Incomplete  Blood Culture (routine x 2)     Status: None (Preliminary result)   Collection Time: 10/27/24  2:05 PM   Specimen: BLOOD RIGHT HAND  Result Value Ref Range Status   Specimen Description   Final    BLOOD RIGHT HAND Performed at [facility], [address], [place], [place] [address]     Special Requests   Final    BOTTLES DRAWN AEROBIC ONLY Blood Culture adequate volume Performed at [facility], [address], [place], [place] [address]    Culture PENDING  Incomplete   Report Status PENDING  Incomplete         Radiology Studies: DG Swallowing Func-Speech Pathology Result Date: 10/27/2024 Table formatting from the original result was not included. Modified Barium Swallow Study Patient Details Name: Keymari Sato MRN: [id number] Date of Birth: Jul 13, 1928 Today's Date: 10/27/2024 HPI/PMH: HPI: 89 yo male admitted from home 1/21 with AMS, decreased PO intake, cough, congestion. Pt found lying on the floor. CXR: no active disease, CTChest: possible aspiration. CTHead: no acute abnormality. SLE ordered 2023 but not completed. PMH: HTN, aortic stenosis, chronic diastolic heart failure, HLD, s/p TAVR (2023).  Lungs coarse, afebrile. Clinical Impression: Pt exhibits moderate oropharyngeal dysphagia, primarily impacted by efficiency. This is suspected to be chronic in nature due to protruding extensions from his cervical spine and a prominent cricopharyngeus that limit bolus transit through the pharynx. In addition to reduced BOT retraction and incomplete epiglottic inversion, this leads to diffuse pharyngeal residue. While there is penetration during the swallow with thin and nectar thick liquids, this increases in volume and progresses to the vocal folds after the swallow (PAS 5). As he continues swallowing and thus, more residue accumulates and spills over the epiglottis, trace aspiration occurs (PAS 7). Cueing to use multiple swallows was effective in clearing the majority of pharyngeal residue when pt was attentive and able to follow commands. Thickened liquids significantly increase the amount of residue and while aspiration of nectar or honey thick liquids was not visualized, anticipate occurrence after the swallow throughout the course of a meal. Aspiration of thicker  materials is thought to be more harmful to pulmonary health. Recommend he resume regular diet with thin liquids. Discussed with pt's family and MD. Encourage mobility and frequent oral care as well as full supervision for meals to cue pt to swallow multiple times.  DIGEST Swallow Severity Rating*             Safety: 2             Efficiency: 3             Overall Pharyngeal Swallow Severity: 2 (moderate) 1: mild; 2: moderate; 3: severe; 4: profound *The Dynamic Imaging Grade of Swallowing Toxicity is standardized for the head and neck cancer population, however, demonstrates promising clinical applications across populations to standardize the clinical rating of pharyngeal swallow safety and severity. Factors that may increase risk of adverse event in presence of aspiration Noe & Lianne 2021): Factors that may increase risk of adverse event in presence of aspiration Noe & Lianne 2021): Frail or deconditioned Recommendations/Plan: Swallowing Evaluation Recommendations Swallowing Evaluation Recommendations Recommendations: PO diet PO Diet Recommendation: Regular; Thin liquids (Level 0) Liquid Administration via: Cup; Straw Medication Administration: Whole meds with puree Supervision: Full assist for feeding; Full supervision/cueing for swallowing strategies Swallowing strategies  : Minimize environmental distractions; Slow rate; Small bites/sips Postural changes: Position pt fully upright for meals Oral care recommendations: Oral care BID (2x/day); Oral care before PO Recommended consults: Consider Palliative care Treatment Plan Treatment Plan Treatment recommendations: Therapy as outlined in treatment plan below Follow-up recommendations: Follow physicians's recommendations for discharge plan and follow up therapies Functional status assessment: Patient has had a recent decline in their functional status and demonstrates the ability to make significant improvements in function in a reasonable and  predictable amount of time. Treatment frequency: Min 2x/week Treatment duration: 1 week Interventions: Aspiration precaution training; Patient/family education; Diet toleration management by SLP Recommendations Recommendations for follow up therapy are one component of a multi-disciplinary discharge planning process, led by the attending physician.  Recommendations may be updated based on patient status, additional functional criteria and insurance authorization. Assessment: Orofacial Exam: Orofacial Exam Oral Cavity: Oral Hygiene: WFL Oral Cavity - Dentition: Missing dentition Orofacial Anatomy: WFL Oral Motor/Sensory Function: WFL Anatomy: Anatomy: Suspected cervical osteophytes; Prominent cricopharyngeus Boluses Administered: Boluses Administered Boluses Administered: Thin liquids (Level 0); Mildly thick liquids (Level 2, nectar thick); Moderately thick liquids (Level 3, honey thick); Puree; Solid  Oral Impairment Domain: Oral Impairment Domain Lip Closure: Escape progressing to mid-chin Tongue control during bolus hold: Not tested Bolus preparation/mastication: Timely and efficient  chewing and mashing Bolus transport/lingual motion: Brisk tongue motion Oral residue: Trace residue lining oral structures Location of oral residue : Tongue Initiation of pharyngeal swallow : Pyriform sinuses  Pharyngeal Impairment Domain: Pharyngeal Impairment Domain Soft palate elevation: No bolus between soft palate (SP)/pharyngeal wall (PW) Laryngeal elevation: Complete superior movement of thyroid  cartilage with complete approximation of arytenoids to epiglottic petiole Anterior hyoid excursion: Complete anterior movement Epiglottic movement: Partial inversion Laryngeal vestibule closure: Incomplete, narrow column air/contrast in laryngeal vestibule Pharyngeal stripping wave : Present - diminished Pharyngeal contraction (A/P view only): N/A Pharyngoesophageal segment opening: Partial distention/partial duration, partial  obstruction of flow Tongue base retraction: Wide column of contrast or air between tongue base and PPW Pharyngeal residue: Collection of residue within or on pharyngeal structures Location of pharyngeal residue: Tongue base; Valleculae; Pharyngeal wall; Pyriform sinuses; Diffuse (>3 areas)  Esophageal Impairment Domain: No data recorded Pill: Pill Consistency administered: Thin liquids (Level 0) Thin liquids (Level 0): Impaired (see clinical impressions) Penetration/Aspiration Scale Score: Penetration/Aspiration Scale Score 1.  Material does not enter airway: Moderately thick liquids (Level 3, honey thick); Puree; Solid; Pill 3.  Material enters airway, remains ABOVE vocal cords and not ejected out: Mildly thick liquids (Level 2, nectar thick) 7.  Material enters airway, passes BELOW cords and not ejected out despite cough attempt by patient: Thin liquids (Level 0) Compensatory Strategies: Compensatory Strategies Compensatory strategies: Yes Multiple swallows: Effective   General Information: Caregiver present: No  Diet Prior to this Study: NPO   Temperature : Normal   Respiratory Status: WFL   Supplemental O2: None (Room air)   History of Recent Intubation: No  Behavior/Cognition: Alert; Cooperative; Pleasant mood; Confused; Requires cueing; Distractible Self-Feeding Abilities: Needs set-up for self-feeding Baseline vocal quality/speech: Normal Volitional Cough: Able to elicit Volitional Swallow: Able to elicit Exam Limitations: No limitations Goal Planning: Prognosis for improved oropharyngeal function: Fair Barriers to Reach Goals: Time post onset No data recorded Patient/Family Stated Goal: none stated Consulted and agree with results and recommendations: Patient; Family member/caregiver; Physician Pain: Pain Assessment Pain Assessment: No/denies pain End of Session: Start Time:SLP Start Time (ACUTE ONLY): 1041 Stop Time: SLP Stop Time (ACUTE ONLY): 1057 Time Calculation:SLP Time Calculation (min) (ACUTE ONLY):  16 min Charges: SLP Evaluations $ SLP Speech Visit: 1 Visit SLP Evaluations $BSS Swallow: 1 Procedure $MBS Swallow: 1 Procedure SLP visit diagnosis: SLP Visit Diagnosis: Dysphagia, unspecified (R13.10) Past Medical History: Past Medical History: Diagnosis Date  Chronic diastolic heart failure (HCC)   Hyperlipidemia   Hypertension   S/P TAVR (transcatheter aortic valve replacement) 01/28/2022  s/p TAVR with a 26 mm Edwards S3UR via the TF approach by Dr. Verlin & Dr. Lucas  Severe aortic stenosis   Spinal stenosis  Past Surgical History: Past Surgical History: Procedure Laterality Date  BIOPSY BREAST    EYE SURGERY Bilateral   RIGHT HEART CATH AND CORONARY ANGIOGRAPHY N/A 12/13/2021  Procedure: RIGHT HEART CATH AND CORONARY ANGIOGRAPHY;  Surgeon: Elmira Newman PARAS, MD;  Location: [facility];  Service: Cardiovascular;  Laterality: N/A;  TEE WITHOUT CARDIOVERSION N/A 01/28/2022  Procedure: TRANSESOPHAGEAL ECHOCARDIOGRAM (TEE);  Surgeon: Verlin Lonni BIRCH, MD;  Location: [facility] OR;  Service: Open Heart Surgery;  Laterality: N/A;  TONSILLECTOMY    TRANSCATHETER AORTIC VALVE REPLACEMENT, TRANSFEMORAL N/A 01/28/2022  Procedure: Transcatheter Aortic Valve Replacement, Transfemoral;  Surgeon: Verlin Lonni BIRCH, MD;  Location: [facility] OR;  Service: Open Heart Surgery;  Laterality: N/A;  ULTRASOUND GUIDANCE FOR VASCULAR ACCESS Bilateral 01/28/2022  Procedure: ULTRASOUND GUIDANCE  FOR VASCULAR ACCESS;  Surgeon: Verlin Lonni BIRCH, MD;  Location: [facility] OR;  Service: Open Heart Surgery;  Laterality: Bilateral; Damien Blumenthal, M.A., CCC-SLP Speech Language Pathology, Acute Rehabilitation Services Secure Chat preferred [phone] 10/27/2024, 1:19 PM  CT Chest Wo Contrast Result Date: 10/27/2024 EXAM: CT CHEST WITHOUT CONTRAST 10/27/2024 02:36:24 AM TECHNIQUE: CT of the chest was performed without the administration of intravenous contrast. Multiplanar reformatted images are provided for review. Automated exposure  control, iterative reconstruction, and/or weight based adjustment of the mA/kV was utilized to reduce the radiation dose to as low as reasonably achievable. COMPARISON: Portable chest from today, portable chest 03/27/2022, CTA chest 12/13/2021. CLINICAL HISTORY: Productive cough for 5 days with shortness of breath. FINDINGS: MEDIASTINUM: Cardiac size is normal. There is no substantial pericardial fluid. A TAVR has been inserted since the previous exam. There are left main and scattered 3-vessel coronary artery calcifications. The pulmonary veins are prominent but unchanged. The pulmonary arteries are normal caliber. There is moderate calcific plaque of the thoracic aorta and mild calcifications in the great vessels. No aortic aneurysm is seen. There is a mildly patulous esophagus, with scattered retained fluid but no wall thickening. This is unchanged. LYMPH NODES: No mediastinal, hilar or axillary lymphadenopathy. LUNGS AND PLEURA: There is diffuse bronchial thickening. There are mild paraseptal and centrilobular emphysematous changes in the lung apices. There is a stable 5 mm subpleural right upper lobe nodule on series 3 axial 38. Scarring changes are noted in the bases, greatest in the right middle lobe base. There is layering fluid in the right main bronchus extending into the right lower lobe bronchus, occasional small branch posterior basal subsegmental bronchial impactions. There is no consolidation. Trace pleural effusions. No pneumothorax. SOFT TISSUES/BONES: Osteopenia and degenerative change of the thoracic spine. There is an interval new but probably chronic mild upper plate anterior wedge compression fracture deformity of the T12 vertebral body. Mild thoracic kyphosis. Moderately advanced bilateral shoulder arthrosis. No acute abnormality of the soft tissues. UPPER ABDOMEN: There is a 6 cm cyst in the upper pole of the right kidney. No acute findings are seen in the upper abdomen. IMPRESSION: 1.  Diffuse bronchial thickening with layering fluid in the right main bronchus extending into the right lower lobe bronchus and occasional posterior basal subsegmental bronchial impactions, without consolidation. Consider aspiration etiology. Aspiration precautions are suggested. 2. Trace pleural effusions. 3. Mild paraseptal and centrilobular emphysema in the lung apices. 4. Stable 5 mm subpleural right upper lobe nodule, with no routine follow-up imaging recommended as per Fleischner Society Guidelines. 5. Basilar scarring, greatest at the right middle lobe base. 6. Chronic prominence in the pulmonary veins. No acute edema. Electronically signed by: Francis Quam MD 10/27/2024 03:10 AM EST RP Workstation: [id number]   CT Head Wo Contrast Result Date: 10/26/2024 EXAM: CT HEAD WITHOUT CONTRAST 10/26/2024 11:50:17 PM TECHNIQUE: CT of the head was performed without the administration of intravenous contrast. Automated exposure control, iterative reconstruction, and/or weight based adjustment of the mA/kV was utilized to reduce the radiation dose to as low as reasonably achievable. COMPARISON: 03/27/2022 CLINICAL HISTORY: Delirium FINDINGS: BRAIN AND VENTRICLES: No acute hemorrhage. No evidence of acute infarct. No hydrocephalus. No extra-axial collection. No mass effect or midline shift. Global cortical atrophy. Subcortical and periventricular small vessel ischemic changes. Atherosclerotic calcifications within the cavernous internal carotid and vertebral arteries. ORBITS: No acute abnormality. Bilateral lens replacement. SINUSES: No acute abnormality. SOFT TISSUES AND SKULL: No acute soft tissue abnormality. No skull fracture. IMPRESSION: 1. No acute  intracranial abnormality. Electronically signed by: Pinkie Pebbles MD 10/26/2024 11:55 PM EST RP Workstation: [id number]   DG Chest Port 1 View Result Date: 10/26/2024 CLINICAL DATA:  Possible sepsis EXAM: PORTABLE CHEST 1 VIEW COMPARISON:  03/27/2022 FINDINGS: No  focal opacity or pleural effusion. Valve prosthesis. Stable cardiomediastinal silhouette with aortic atherosclerosis. No pneumothorax IMPRESSION: No active disease. Electronically Signed   By: Luke Bun M.D.   On: 10/26/2024 23:27    Scheduled Meds:  aspirin  EC  81 mg Oral Daily   carvedilol   6.25 mg Oral BID WC   enoxaparin  (LOVENOX ) injection  40 mg Subcutaneous Q24H   ezetimibe   10 mg Oral Daily   gabapentin   100 mg Oral BID   ipratropium-albuterol   3 mL Nebulization TID   rosuvastatin   20 mg Oral Daily   Continuous Infusions:  ampicillin -sulbactam (UNASYN ) IV 3 g (10/28/24 0146)   dextrose  40 mL/hr at 10/27/24 1120     LOS: 0 days    Almarie KANDICE Hoots, MD  10/28/2024, 8:49 AM   "

## 2024-10-28 NOTE — Plan of Care (Signed)
" °  Problem: Respiratory: Goal: Ability to maintain adequate ventilation will improve Outcome: Progressing Goal: Ability to maintain a clear airway will improve Outcome: Progressing   Problem: Clinical Measurements: Goal: Respiratory complications will improve Outcome: Progressing Goal: Cardiovascular complication will be avoided Outcome: Progressing   Problem: Safety: Goal: Ability to remain free from injury will improve Outcome: Progressing   "

## 2024-10-28 NOTE — Progress Notes (Addendum)
 MEWS Progress Note  Patient Details Name: Marcus Powers MRN: [id number] DOB: Jul 05, 1928 Today's Date: 10/28/2024   MEWS Flowsheet Documentation:  Assess: MEWS Score Temp: 98.5 F (36.9 C) BP: (!) 179/144 MAP (mmHg): 110 Pulse Rate: (!) 110 ECG Heart Rate: (!) 135 Resp: (!) 32 Level of Consciousness: Alert SpO2: 97 % O2 Device: Room Air Assess: MEWS Score MEWS Temp: 0 MEWS Systolic: 0 MEWS Pulse: 3 MEWS RR: 2 MEWS LOC: 0 MEWS Score: 5 MEWS Score Color: Red Assess: SIRS CRITERIA SIRS Temperature : 0 SIRS Respirations : 1 SIRS Pulse: 1 SIRS WBC: 0 SIRS Score Sum : 2 SIRS Temperature : 0 SIRS Pulse: 1 SIRS Respirations : 1 SIRS WBC: 0 SIRS Score Sum : 2 Assess: if the MEWS score is Yellow or Red Were vital signs accurate and taken at a resting state?: No, vital signs rechecked (patient is agitated) Does the patient meet 2 or more of the SIRS criteria?: Yes Does the patient have a confirmed or suspected source of infection?: Yes MEWS guidelines implemented : Yes, red Treat MEWS Interventions: Considered administering scheduled or prn medications/treatments as ordered Take Vital Signs Increase Vital Sign Frequency : Red: Q1hr x2, continue Q4hrs until patient remains green for 12hrs Escalate MEWS: Escalate: Red: Discuss with charge nurse and notify provider. Consider notifying RRT. If remains red for 2 hours consider need for higher level of care Provider Notification Provider Name/Title: Lavanda Horns, NP Date Provider Notified: 10/28/24 Time Provider Notified: 0300 Method of Notification: Page (secure chat, provided assessed patient at bedside. RN to administer IV haldol  qTc .44 per telemetry tech) Notification Reason: Change in status Provider response: At bedside (RN to administer prn IV haldol  and reassess vitals when patient no longer agitated) Date of Provider Response: 10/28/24 Time of Provider Response: 0300  Per Telemetry tech, the patient's qTc is .44    RN notified Lavanda Horns, NP of the qTc above. RN told to administer the prn IV Haldol  (see MAR). RN administered prn Haldol  per the prn order.     Jori LITTIE Eck 10/28/2024, 3:17 AM

## 2024-10-28 NOTE — Progress Notes (Signed)
 OT Cancellation Note  Patient Details Name: Marcus Powers MRN: [id number] DOB: 03-15-1928   Cancelled Treatment:    Reason Eval/Treat Not Completed: Patient's level of consciousness Attempted therapy but pt to lethargic/unarousable. Pt had been combative and received haldol . Will briefly open eyes but then back asleep and unable to participate. Will f/u as able.   Leita Howell, OTR/L,CBIS  Supplemental OT - [facility] and [facility] Secure Chat Preferred   10/28/2024, 10:48 AM

## 2024-10-29 DIAGNOSIS — R410 Disorientation, unspecified: Principal | ICD-10-CM

## 2024-10-29 LAB — GLUCOSE, CAPILLARY
Glucose-Capillary: 101 mg/dL — ABNORMAL HIGH (ref 70–99)
Glucose-Capillary: 112 mg/dL — ABNORMAL HIGH (ref 70–99)
Glucose-Capillary: 133 mg/dL — ABNORMAL HIGH (ref 70–99)
Glucose-Capillary: 93 mg/dL (ref 70–99)

## 2024-10-29 NOTE — Evaluation (Signed)
 Physical Therapy Evaluation Patient Details Name: Marcus Powers MRN: [id number] DOB: 06/21/1928 Today's Date: 10/29/2024  History of Present Illness  Pt is a 89yo male from home with cough with AMS and found down, found to have CAP, likely aspiration via chest CT. Ambulates with RW at baseline, of note, recent passing of his wife.  PMH: CHF, HLD, HTN, hx of TAVR, spinal stenosis  Clinical Impression  Pt admitted with above diagnosis. Pt required maxA for bed mobility with use of bed rails and HOB elevated; modA for STS to RW, CGA for SPT with RW to recliner. Pt overall lethargic and confused during session but fully participatory with reorientation and simple 1-step commands. Pt currently with functional limitations due to the deficits listed below (see PT Problem List). Pt will benefit from acute skilled PT to increase their independence and safety with mobility to allow discharge.           If plan is discharge home, recommend the following: A little help with walking and/or transfers;A little help with bathing/dressing/bathroom;Assistance with cooking/housework;Direct supervision/assist for medications management;Direct supervision/assist for financial management;Assist for transportation;Help with stairs or ramp for entrance;Supervision due to cognitive status   Can travel by private vehicle        Equipment Recommendations None recommended by PT  Recommendations for Other Services       Functional Status Assessment Patient has had a recent decline in their functional status and demonstrates the ability to make significant improvements in function in a reasonable and predictable amount of time.     Precautions / Restrictions Precautions Precautions: Fall Restrictions Weight Bearing Restrictions Per Provider Order: No      Mobility  Bed Mobility Overal bed mobility: Needs Assistance Bed Mobility: Supine to Sit     Supine to sit: HOB elevated, Used rails, Max assist      General bed mobility comments: Max assist to sit up with hand over hand placement on bedrail, HOB elevated, assist for trunk elevation and BLE offf bed    Transfers Overall transfer level: Needs assistance Equipment used: Rolling walker (2 wheels) Transfers: Sit to/from Stand, Bed to chair/wheelchair/BSC Sit to Stand: Mod assist   Step pivot transfers: Contact guard assist       General transfer comment: Pt required modA for lift assist from elevated surface to RW; CGA wiuth significant multimodal cuing for sequencing for transfer.    Ambulation/Gait                  Stairs            Wheelchair Mobility     Tilt Bed    Modified Rankin (Stroke Patients Only)       Balance Overall balance assessment: Needs assistance Sitting-balance support: Feet supported, Bilateral upper extremity supported Sitting balance-Leahy Scale: Poor     Standing balance support: Reliant on assistive device for balance, During functional activity, Bilateral upper extremity supported Standing balance-Leahy Scale: Poor                               Pertinent Vitals/Pain Pain Assessment Pain Assessment: No/denies pain Breathing: normal Negative Vocalization: none Facial Expression: smiling or inexpressive Body Language: tense, distressed pacing, fidgeting Consolability: no need to console PAINAD Score: 1    Home Living Family/patient expects to be discharged to:: Private residence Living Arrangements: Children Available Help at Discharge: Family Type of Home: House Home Access: Stairs to enter Entrance Stairs-Rails: Right  Entrance Stairs-Number of Steps: 4   Home Layout: Able to live on main level with bedroom/bathroom Home Equipment: Rolling Walker (2 wheels)      Prior Function Prior Level of Function : Needs assist;Patient poor historian/Family not available             Mobility Comments: Pt poor historian, home environment from prior  session/chart review non-confirmed by patient. Pt reports no assist required for mobility other than use of walker in the house ADLs Comments: Reports no one helps with bathing/dressing but family helps with iADLS     Extremity/Trunk Assessment   Upper Extremity Assessment Upper Extremity Assessment: Defer to OT evaluation    Lower Extremity Assessment Lower Extremity Assessment: Generalized weakness (Pt grossly 3/5 with lack of knee extension, strong PF contracture of both feet but able to take small steps)       Communication   Communication Communication: Impaired Factors Affecting Communication: Hearing impaired    Cognition Arousal: Lethargic Behavior During Therapy: Flat affect, Restless   PT - Cognitive impairments: No family/caregiver present to determine baseline, Attention, Sequencing, Initiation                       PT - Cognition Comments: Pt lethargic, arousable to light touch and verbal stimuili, somewhat lethargic during session requiring re-orientation Following commands: Impaired Following commands impaired: Follows one step commands inconsistently     Cueing Cueing Techniques: Verbal cues     General Comments General comments (skin integrity, edema, etc.): Sitter present    Exercises     Assessment/Plan    PT Assessment Patient needs continued PT services  PT Problem List Decreased strength;Decreased range of motion;Decreased activity tolerance;Decreased balance;Decreased mobility;Decreased coordination;Decreased cognition       PT Treatment Interventions DME instruction;Gait training;Stair training;Functional mobility training;Therapeutic exercise;Therapeutic activities;Balance training;Patient/family education;Wheelchair mobility training;Manual techniques    PT Goals (Current goals can be found in the Care Plan section)  Acute Rehab PT Goals Patient Stated Goal: none stated PT Goal Formulation: Patient unable to participate in goal  setting Time For Goal Achievement: 11/12/24 Potential to Achieve Goals: Good    Frequency Min 1X/week     Co-evaluation               AM-PAC PT 6 Clicks Mobility  Outcome Measure Help needed turning from your back to your side while in a flat bed without using bedrails?: A Lot Help needed moving from lying on your back to sitting on the side of a flat bed without using bedrails?: A Lot Help needed moving to and from a bed to a chair (including a wheelchair)?: A Little Help needed standing up from a chair using your arms (e.g., wheelchair or bedside chair)?: A Lot Help needed to walk in hospital room?: Total Help needed climbing 3-5 steps with a railing? : Total 6 Click Score: 11    End of Session Equipment Utilized During Treatment: Gait belt Activity Tolerance: Patient limited by lethargy Patient left: in chair;with call bell/phone within reach;with chair alarm set;with nursing/sitter in room Nurse Communication: Mobility status PT Visit Diagnosis: Muscle weakness (generalized) (M62.81);Difficulty in walking, not elsewhere classified (R26.2);Other abnormalities of gait and mobility (R26.89)    Time: 8984-8957 PT Time Calculation (min) (ACUTE ONLY): 27 min   Charges:   PT Evaluation $PT Eval Low Complexity: 1 Low PT Treatments $Therapeutic Activity: 8-22 mins PT General Charges $$ ACUTE PT VISIT: 1 Visit        Elsie Grieves,  PT, DPT [facility] Rehabilitation Department Office: [phone]  Elsie Grieves 10/29/2024, 10:44 AM

## 2024-10-29 NOTE — Evaluation (Signed)
 Occupational Therapy Evaluation Patient Details Name: Marcus Powers MRN: [id number] DOB: 01-05-1928 Today's Date: 10/29/2024   History of Present Illness   Pt is a 89yo male from home with cough with AMS and found down, found to have CAP, likely aspiration via chest CT. Ambulates with RW at baseline, of note, recent passing of his wife.  PMH: CHF, HLD, HTN, hx of TAVR, spinal stenosis     Clinical Impressions Pt admitted with the above. Pt currently with functional limitations due to the deficits listed below (see OT Problem List).  Pt will benefit from acute skilled OT to increase their safety and independence with ADL and functional mobility for ADL to facilitate discharge. Based on eval pt will need significant A with ADL activity up on DC.  Will benefit post acute OT to increase I with ADL activity      If plan is discharge home, recommend the following:   A little help with walking and/or transfers;A little help with bathing/dressing/bathroom     Functional Status Assessment   Patient has had a recent decline in their functional status and demonstrates the ability to make significant improvements in function in a reasonable and predictable amount of time.       Precautions/Restrictions   Precautions Precautions: Fall Restrictions Weight Bearing Restrictions Per Provider Order: No     Mobility Bed Mobility Overal bed mobility: Needs Assistance Bed Mobility: Sit to Supine     Supine to sit: Max assist, +2 for physical assistance          Transfers Overall transfer level: Needs assistance Equipment used: 2 person hand held assist Transfers: Sit to/from Stand, Bed to chair/wheelchair/BSC Sit to Stand: Mod assist Stand pivot transfers: Mod assist   Step pivot transfers: Mod assist     General transfer comment: pt needed increased A from chair to bed. pt very fatigued during OT eval      Balance Overall balance assessment: Needs  assistance Sitting-balance support: Feet supported, Bilateral upper extremity supported Sitting balance-Leahy Scale: Poor     Standing balance support: Reliant on assistive device for balance, During functional activity, Bilateral upper extremity supported Standing balance-Leahy Scale: Poor                             ADL either performed or assessed with clinical judgement   ADL Overall ADL's : Needs assistance/impaired Eating/Feeding: Minimal assistance;Sitting   Grooming: Minimal assistance;Sitting   Upper Body Bathing: Maximal assistance;Sitting   Lower Body Bathing: Sit to/from stand;Maximal assistance   Upper Body Dressing : Moderate assistance;Sitting   Lower Body Dressing: Maximal assistance;Sit to/from stand   Toilet Transfer: Maximal assistance             General ADL Comments: ADL assessment based on clinical reasoning     Vision Patient Visual Report: No change from baseline       Perception         Praxis         Pertinent Vitals/Pain Pain Assessment Pain Assessment: No/denies pain     Extremity/Trunk Assessment Upper Extremity Assessment Upper Extremity Assessment: Defer to OT evaluation   Lower Extremity Assessment Lower Extremity Assessment: Generalized weakness       Communication Communication Communication: Impaired Factors Affecting Communication: Hearing impaired   Cognition Arousal: Lethargic Behavior During Therapy: Flat affect, Restless  Following commands: Impaired Following commands impaired: Follows one step commands inconsistently     Cueing  General Comments   Cueing Techniques: Verbal cues  Sitter present           Home Living Family/patient expects to be discharged to:: Private residence Living Arrangements: Children Available Help at Discharge: Family Type of Home: House Home Access: Stairs to enter Secretary/administrator of Steps: 4 Entrance  Stairs-Rails: Right Home Layout: Able to live on main level with bedroom/bathroom     Bathroom Shower/Tub: Producer, Television/film/video: Standard Bathroom Accessibility: Yes   Home Equipment: Agricultural Consultant (2 wheels)          Prior Functioning/Environment Prior Level of Function : Needs assist;Patient poor historian/Family not available             Mobility Comments: Pt poor historian, home environment from prior session/chart review non-confirmed by patient. Pt reports no assist required for mobility other than use of walker in the house ADLs Comments: Reports no one helps with bathing/dressing but family helps with iADLS    OT Problem List: Decreased strength;Decreased activity tolerance;Impaired balance (sitting and/or standing);Decreased safety awareness   OT Treatment/Interventions: Self-care/ADL training;Therapeutic activities      OT Goals(Current goals can be found in the care plan section)   Acute Rehab OT Goals Patient Stated Goal: to bed OT Goal Formulation: With patient Time For Goal Achievement: 11/12/24 ADL Goals Pt Will Perform Eating: with set-up;sitting Pt Will Perform Grooming: with set-up;standing Pt Will Transfer to Toilet: with min assist;ambulating;regular height toilet Pt Will Perform Toileting - Clothing Manipulation and hygiene: with min assist;sit to/from stand   OT Frequency:  Min 2X/week    Co-evaluation              AM-PAC OT 6 Clicks Daily Activity     Outcome Measure Help from another person eating meals?: A Lot Help from another person taking care of personal grooming?: A Lot Help from another person toileting, which includes using toliet, bedpan, or urinal?: A Lot Help from another person bathing (including washing, rinsing, drying)?: A Lot Help from another person to put on and taking off regular upper body clothing?: A Lot Help from another person to put on and taking off regular lower body clothing?: A Lot 6  Click Score: 12   End of Session Nurse Communication: Mobility status  Activity Tolerance: Patient limited by lethargy Patient left: in bed;with call bell/phone within reach;with nursing/sitter in room  OT Visit Diagnosis: Unsteadiness on feet (R26.81)                Time: 1130-1145 OT Time Calculation (min): 15 min Charges:  OT General Charges $OT Visit: 1 Visit OT Evaluation $OT Eval Low Complexity: 1 Low    Marcus Powers D 10/29/2024, 12:00 PM

## 2024-10-29 NOTE — Progress Notes (Signed)
 " PROGRESS NOTE    Marcus Powers  FMW:[id number] DOB: 12/01/1927 DOA: 10/26/2024 PCP: Charlott Dorn LABOR, MD  Brief Narrative: 89 yo male with a history of hypertension severe aortic stenosis TAVR for spinal stenosis chronic back pain chronic systolic heart failure admitted with cough and congestion.  At his baseline he is able to ambulate inside the house with a walker he is able to eat although they have noticed that he coughs more at dinner while eating.  He just lost his wife last month with dementia.  Since he has been sitting around more than before and complains of chronic back pain.  CT on admission was concerning for right main bronchus layering of fluid with right lower lobe as well as posterior basal subsegmental bronchial consolidation consistent with aspiration.  Patient has been seen by speech therapy and modified barium swallow has been done and recommending a regular diet with thin liquids.  Patient is DNR DNI.  Family was also concerned about if patient had a stroke as he was trying to eat at home he would take the food to the side of his face instead of to his mouth and some disorientation.  No focal weakness at home.  CT of the head this admission shows cortical atrophy with no acute changes.  MRI MRA in 2023 noted with the same changes.  Assessment & Plan:   Principal Problem:   CAP (community acquired pneumonia) Active Problems:   Chronic heart failure with preserved ejection fraction (HCC)   Essential hypertension   Hyperlipidemia   Normocytic anemia   Aspiration pneumonia of right lung (HCC)  #1 pneumonia likely aspiration, patient admitted with congestion and cough.  He was not hypoxic on admission.  No leukocytosis or lactic acidosis on admit Continue Unasyn  3 g IV Q6.  Appreciate speech consult.  Per nursing staff patient is choking on soft diet.  He is used to having a denture at home which he does not have it currently.   Continue neb treatments  Encourage I-S when  able.  #2 Chronic heart failure with preserved ejection fraction (HCC) Seems to be euvolemic. Torsemide  has been held.? Unclear if he was taking it at home..  He appears more on the dry side. Continue carvedilol  6.25 mg p.o. twice daily.    # 3 Essential hypertension Continue coreg      # 4 Hyperlipidemia Continue ezetimibe  10 mg p.o. daily.   # 5  Normocytic anemia Monitor hematocrit and hemoglobin.   Estimated body mass index is 23.3 kg/m as calculated from the following:   Height as of 04/23/23: 5' 5 (1.651 m).   Weight as of 04/23/23: 63.5 kg.  DVT prophylaxis: lovenox  Code Status: dnr Family Communication: dw daughter and SIL Disposition Plan:  Status is: Observation   Consultants: NONE  Procedures:MBS Antimicrobials UNASYN   Subjective:  No nausea vomiting diarrhea reported overnight he ate some pears did not really have a dinner Sitter at the bedside When I asked him how are you he said if you leave me alone I will be fine  Objective: Vitals:   10/28/24 1828 10/28/24 1956 10/28/24 2102 10/29/24 0831  BP: (!) 165/71 (!) 179/93  (!) 151/75  Pulse: 71 88  86  Resp:  16  18  Temp:  98.3 F (36.8 C)    TempSrc:  Oral    SpO2:   97% 97%    Intake/Output Summary (Last 24 hours) at 10/29/2024 0838 Last data filed at 10/29/2024 0812 Gross per 24 hour  Intake 1150.67 ml  Output 575 ml  Net 575.67 ml   There were no vitals filed for this visit.  Examination:  General exam: Appears in no acute distress Respiratory system: Clear to auscultation. Respiratory effort normal. Cardiovascular system: tachy Gastrointestinal system: Abdomen is nondistended, soft and nontender. No organomegaly or masses felt. Normal bowel sounds heard. Central nervous system: agitated. Extremities: no edema   Data Reviewed: I have personally reviewed following labs and imaging studies  CBC: Recent Labs  Lab 10/26/24 2303 10/28/24 0616  WBC 7.3 9.3  NEUTROABS 5.0  --   HGB  12.0* 12.0*  HCT 36.3* 37.0*  MCV 100.0 99.7  PLT 201 190   Basic Metabolic Panel: Recent Labs  Lab 10/26/24 2303 10/28/24 0616  NA 140 143  K 4.4 3.9  CL 106 107  CO2 27 22  GLUCOSE 122* 103*  BUN 24* 15  CREATININE 1.11 0.98  CALCIUM  9.2 9.7   GFR: CrCl cannot be calculated (Unknown ideal weight.). Liver Function Tests: Recent Labs  Lab 10/26/24 2303 10/28/24 0616  AST 27 27  ALT 24 18  ALKPHOS 77 76  BILITOT 0.7 0.9  PROT 6.8 7.0  ALBUMIN 4.0 4.0   No results for input(s): LIPASE, AMYLASE in the last 168 hours. No results for input(s): AMMONIA in the last 168 hours. Coagulation Profile: No results for input(s): INR, PROTIME in the last 168 hours. Cardiac Enzymes: No results for input(s): CKTOTAL, CKMB, CKMBINDEX, TROPONINI in the last 168 hours. BNP (last 3 results) No results for input(s): PROBNP in the last 8760 hours. HbA1C: No results for input(s): HGBA1C in the last 72 hours. CBG: Recent Labs  Lab 10/28/24 0009 10/28/24 0614 10/28/24 1218 10/28/24 2358 10/29/24 0609  GLUCAP 124* 110* 94 112* 93   Lipid Profile: No results for input(s): CHOL, HDL, LDLCALC, TRIG, CHOLHDL, LDLDIRECT in the last 72 hours. Thyroid  Function Tests: No results for input(s): TSH, T4TOTAL, FREET4, T3FREE, THYROIDAB in the last 72 hours. Anemia Panel: No results for input(s): VITAMINB12, FOLATE, FERRITIN, TIBC, IRON, RETICCTPCT in the last 72 hours. Sepsis Labs: Recent Labs  Lab 10/27/24 0120  LATICACIDVEN 0.7    Recent Results (from the past 240 hours)  Resp panel by RT-PCR (RSV, Flu A&B, Covid) Anterior Nasal Swab     Status: None   Collection Time: 10/27/24  1:05 AM   Specimen: Anterior Nasal Swab  Result Value Ref Range Status   SARS Coronavirus 2 by RT PCR NEGATIVE NEGATIVE Final    Comment: (NOTE) SARS-CoV-2 target nucleic acids are NOT DETECTED.  The SARS-CoV-2 RNA is generally detectable in upper  respiratory specimens during the acute phase of infection. The lowest concentration of SARS-CoV-2 viral copies this assay can detect is 138 copies/mL. A negative result does not preclude SARS-Cov-2 infection and should not be used as the sole basis for treatment or other patient management decisions. A negative result may occur with  improper specimen collection/handling, submission of specimen other than nasopharyngeal swab, presence of viral mutation(s) within the areas targeted by this assay, and inadequate number of viral copies(<138 copies/mL). A negative result must be combined with clinical observations, patient history, and epidemiological information. The expected result is Negative.  Fact Sheet for Patients:  bloggercourse.com  Fact Sheet for Healthcare Providers:  seriousbroker.it  This test is no t yet approved or cleared by the [place]  FDA and  has been authorized for detection and/or diagnosis of SARS-CoV-2 by FDA under an Emergency Use Authorization (EUA). This EUA  will remain  in effect (meaning this test can be used) for the duration of the COVID-19 declaration under Section 564(b)(1) of the Act, 21 U.S.C.section 360bbb-3(b)(1), unless the authorization is terminated  or revoked sooner.       Influenza A by PCR NEGATIVE NEGATIVE Final   Influenza B by PCR NEGATIVE NEGATIVE Final    Comment: (NOTE) The Xpert Xpress SARS-CoV-2/FLU/RSV plus assay is intended as an aid in the diagnosis of influenza from Nasopharyngeal swab specimens and should not be used as a sole basis for treatment. Nasal washings and aspirates are unacceptable for Xpert Xpress SARS-CoV-2/FLU/RSV testing.  Fact Sheet for Patients: bloggercourse.com  Fact Sheet for Healthcare Providers: seriousbroker.it  This test is not yet approved or cleared by the [place]  FDA and has been  authorized for detection and/or diagnosis of SARS-CoV-2 by FDA under an Emergency Use Authorization (EUA). This EUA will remain in effect (meaning this test can be used) for the duration of the COVID-19 declaration under Section 564(b)(1) of the Act, 21 U.S.C. section 360bbb-3(b)(1), unless the authorization is terminated or revoked.     Resp Syncytial Virus by PCR NEGATIVE NEGATIVE Final    Comment: (NOTE) Fact Sheet for Patients: bloggercourse.com  Fact Sheet for Healthcare Providers: seriousbroker.it  This test is not yet approved or cleared by the [place]  FDA and has been authorized for detection and/or diagnosis of SARS-CoV-2 by FDA under an Emergency Use Authorization (EUA). This EUA will remain in effect (meaning this test can be used) for the duration of the COVID-19 declaration under Section 564(b)(1) of the Act, 21 U.S.C. section 360bbb-3(b)(1), unless the authorization is terminated or revoked.  Performed at [facility], [address] [place], [place] [address]   Blood Culture (routine x 2)     Status: None (Preliminary result)   Collection Time: 10/27/24  1:14 AM   Specimen: BLOOD  Result Value Ref Range Status   Specimen Description   Final    BLOOD LEFT ANTECUBITAL Performed at [facility], [address], [place], [place] [address]    Special Requests   Final    BOTTLES DRAWN AEROBIC AND ANAEROBIC Blood Culture results may not be optimal due to an inadequate volume of blood received in culture bottles Performed at [facility] [address], [place], [place] [address]    Culture   Final    NO GROWTH 1 DAY Performed at [facility], [address], [place], [place] [address]    Report Status PENDING  Incomplete  Blood Culture (routine x 2)     Status: None (Preliminary result)   Collection Time: 10/27/24  2:05 PM   Specimen: BLOOD  RIGHT HAND  Result Value Ref Range Status   Specimen Description   Final    BLOOD RIGHT HAND Performed at [facility], [address], [place], [place] [address]    Special Requests   Final    BOTTLES DRAWN AEROBIC ONLY Blood Culture adequate volume Performed at [facility], [address], [place], [place] [address]    Culture   Final    NO GROWTH < 24 HOURS Performed at [facility], [address], [place], [place] [address]    Report Status PENDING  Incomplete         Radiology Studies: DG Swallowing Func-Speech Pathology Result Date: 10/27/2024 Table formatting from the original result was not included. Modified Barium Swallow Study Patient Details  Name: Stedman [place] MRN: [id number] Date of Birth: 01-03-1928 Today's Date: 10/27/2024 HPI/PMH: HPI: 89 yo male admitted from home 1/21 with AMS, decreased PO intake, cough, congestion. Pt found lying on the floor. CXR: no active disease, CTChest: possible aspiration. CTHead: no acute abnormality. SLE ordered 2023 but not completed. PMH: HTN, aortic stenosis, chronic diastolic heart failure, HLD, s/p TAVR (2023). Lungs coarse, afebrile. Clinical Impression: Pt exhibits moderate oropharyngeal dysphagia, primarily impacted by efficiency. This is suspected to be chronic in nature due to protruding extensions from his cervical spine and a prominent cricopharyngeus that limit bolus transit through the pharynx. In addition to reduced BOT retraction and incomplete epiglottic inversion, this leads to diffuse pharyngeal residue. While there is penetration during the swallow with thin and nectar thick liquids, this increases in volume and progresses to the vocal folds after the swallow (PAS 5). As he continues swallowing and thus, more residue accumulates and spills over the epiglottis, trace aspiration occurs (PAS 7). Cueing to use multiple swallows was effective in clearing the majority of pharyngeal residue when pt was  attentive and able to follow commands. Thickened liquids significantly increase the amount of residue and while aspiration of nectar or honey thick liquids was not visualized, anticipate occurrence after the swallow throughout the course of a meal. Aspiration of thicker materials is thought to be more harmful to pulmonary health. Recommend he resume regular diet with thin liquids. Discussed with pt's family and MD. Encourage mobility and frequent oral care as well as full supervision for meals to cue pt to swallow multiple times.  DIGEST Swallow Severity Rating*             Safety: 2             Efficiency: 3             Overall Pharyngeal Swallow Severity: 2 (moderate) 1: mild; 2: moderate; 3: severe; 4: profound *The Dynamic Imaging Grade of Swallowing Toxicity is standardized for the head and neck cancer population, however, demonstrates promising clinical applications across populations to standardize the clinical rating of pharyngeal swallow safety and severity. Factors that may increase risk of adverse event in presence of aspiration Noe & Lianne 2021): Factors that may increase risk of adverse event in presence of aspiration Noe & Lianne 2021): Frail or deconditioned Recommendations/Plan: Swallowing Evaluation Recommendations Swallowing Evaluation Recommendations Recommendations: PO diet PO Diet Recommendation: Regular; Thin liquids (Level 0) Liquid Administration via: Cup; Straw Medication Administration: Whole meds with puree Supervision: Full assist for feeding; Full supervision/cueing for swallowing strategies Swallowing strategies  : Minimize environmental distractions; Slow rate; Small bites/sips Postural changes: Position pt fully upright for meals Oral care recommendations: Oral care BID (2x/day); Oral care before PO Recommended consults: Consider Palliative care Treatment Plan Treatment Plan Treatment recommendations: Therapy as outlined in treatment plan below Follow-up recommendations:  Follow physicians's recommendations for discharge plan and follow up therapies Functional status assessment: Patient has had a recent decline in their functional status and demonstrates the ability to make significant improvements in function in a reasonable and predictable amount of time. Treatment frequency: Min 2x/week Treatment duration: 1 week Interventions: Aspiration precaution training; Patient/family education; Diet toleration management by SLP Recommendations Recommendations for follow up therapy are one component of a multi-disciplinary discharge planning process, led by the attending physician.  Recommendations may be updated based on patient status, additional functional criteria and insurance authorization. Assessment: Orofacial Exam: Orofacial Exam Oral Cavity: Oral Hygiene: WFL Oral Cavity - Dentition: Missing dentition Orofacial Anatomy:  WFL Oral Motor/Sensory Function: WFL Anatomy: Anatomy: Suspected cervical osteophytes; Prominent cricopharyngeus Boluses Administered: Boluses Administered Boluses Administered: Thin liquids (Level 0); Mildly thick liquids (Level 2, nectar thick); Moderately thick liquids (Level 3, honey thick); Puree; Solid  Oral Impairment Domain: Oral Impairment Domain Lip Closure: Escape progressing to mid-chin Tongue control during bolus hold: Not tested Bolus preparation/mastication: Timely and efficient chewing and mashing Bolus transport/lingual motion: Brisk tongue motion Oral residue: Trace residue lining oral structures Location of oral residue : Tongue Initiation of pharyngeal swallow : Pyriform sinuses  Pharyngeal Impairment Domain: Pharyngeal Impairment Domain Soft palate elevation: No bolus between soft palate (SP)/pharyngeal wall (PW) Laryngeal elevation: Complete superior movement of thyroid  cartilage with complete approximation of arytenoids to epiglottic petiole Anterior hyoid excursion: Complete anterior movement Epiglottic movement: Partial inversion Laryngeal  vestibule closure: Incomplete, narrow column air/contrast in laryngeal vestibule Pharyngeal stripping wave : Present - diminished Pharyngeal contraction (A/P view only): N/A Pharyngoesophageal segment opening: Partial distention/partial duration, partial obstruction of flow Tongue base retraction: Wide column of contrast or air between tongue base and PPW Pharyngeal residue: Collection of residue within or on pharyngeal structures Location of pharyngeal residue: Tongue base; Valleculae; Pharyngeal wall; Pyriform sinuses; Diffuse (>3 areas)  Esophageal Impairment Domain: No data recorded Pill: Pill Consistency administered: Thin liquids (Level 0) Thin liquids (Level 0): Impaired (see clinical impressions) Penetration/Aspiration Scale Score: Penetration/Aspiration Scale Score 1.  Material does not enter airway: Moderately thick liquids (Level 3, honey thick); Puree; Solid; Pill 3.  Material enters airway, remains ABOVE vocal cords and not ejected out: Mildly thick liquids (Level 2, nectar thick) 7.  Material enters airway, passes BELOW cords and not ejected out despite cough attempt by patient: Thin liquids (Level 0) Compensatory Strategies: Compensatory Strategies Compensatory strategies: Yes Multiple swallows: Effective   General Information: Caregiver present: No  Diet Prior to this Study: NPO   Temperature : Normal   Respiratory Status: WFL   Supplemental O2: None (Room air)   History of Recent Intubation: No  Behavior/Cognition: Alert; Cooperative; Pleasant mood; Confused; Requires cueing; Distractible Self-Feeding Abilities: Needs set-up for self-feeding Baseline vocal quality/speech: Normal Volitional Cough: Able to elicit Volitional Swallow: Able to elicit Exam Limitations: No limitations Goal Planning: Prognosis for improved oropharyngeal function: Fair Barriers to Reach Goals: Time post onset No data recorded Patient/Family Stated Goal: none stated Consulted and agree with results and recommendations:  Patient; Family member/caregiver; Physician Pain: Pain Assessment Pain Assessment: No/denies pain End of Session: Start Time:SLP Start Time (ACUTE ONLY): 1041 Stop Time: SLP Stop Time (ACUTE ONLY): 1057 Time Calculation:SLP Time Calculation (min) (ACUTE ONLY): 16 min Charges: SLP Evaluations $ SLP Speech Visit: 1 Visit SLP Evaluations $BSS Swallow: 1 Procedure $MBS Swallow: 1 Procedure SLP visit diagnosis: SLP Visit Diagnosis: Dysphagia, unspecified (R13.10) Past Medical History: Past Medical History: Diagnosis Date  Chronic diastolic heart failure (HCC)   Hyperlipidemia   Hypertension   S/P TAVR (transcatheter aortic valve replacement) 01/28/2022  s/p TAVR with a 26 mm Edwards S3UR via the TF approach by Dr. Verlin & Dr. Lucas  Severe aortic stenosis   Spinal stenosis  Past Surgical History: Past Surgical History: Procedure Laterality Date  BIOPSY BREAST    EYE SURGERY Bilateral   RIGHT HEART CATH AND CORONARY ANGIOGRAPHY N/A 12/13/2021  Procedure: RIGHT HEART CATH AND CORONARY ANGIOGRAPHY;  Surgeon: Elmira Newman PARAS, MD;  Location: [facility]VASIVE [facility];  Service: Cardiovascular;  Laterality: N/A;  TEE WITHOUT CARDIOVERSION N/A 01/28/2022  Procedure: TRANSESOPHAGEAL ECHOCARDIOGRAM (TEE);  Surgeon: Verlin Bruckner  D, MD;  Location: [facility] OR;  Service: Open Heart Surgery;  Laterality: N/A;  TONSILLECTOMY    TRANSCATHETER AORTIC VALVE REPLACEMENT, TRANSFEMORAL N/A 01/28/2022  Procedure: Transcatheter Aortic Valve Replacement, Transfemoral;  Surgeon: Verlin Lonni BIRCH, MD;  Location: [facility] OR;  Service: Open Heart Surgery;  Laterality: N/A;  ULTRASOUND GUIDANCE FOR VASCULAR ACCESS Bilateral 01/28/2022  Procedure: ULTRASOUND GUIDANCE FOR VASCULAR ACCESS;  Surgeon: Verlin Lonni BIRCH, MD;  Location: [facility];  Service: Open Heart Surgery;  Laterality: Bilateral; Damien Blumenthal, M.A., CCC-SLP Speech Language Pathology, Acute Rehabilitation Services Secure Chat preferred [phone] 10/27/2024, 1:19  PM   Scheduled Meds:  acetaminophen   1,000 mg Oral Q8H   aspirin  EC  81 mg Oral Daily   carvedilol   6.25 mg Oral BID WC   enoxaparin  (LOVENOX ) injection  40 mg Subcutaneous Q24H   ezetimibe   10 mg Oral Daily   gabapentin   100 mg Oral BID   ipratropium-albuterol   3 mL Nebulization TID   QUEtiapine   12.5 mg Oral QHS   rosuvastatin   20 mg Oral Daily   Continuous Infusions:  ampicillin -sulbactam (UNASYN ) IV 3 g (10/29/24 0300)   dextrose  40 mL/hr at 10/28/24 1845     LOS: 1 day    Almarie KANDICE Hoots, MD  10/29/2024, 8:38 AM   "

## 2024-10-30 DIAGNOSIS — J690 Pneumonitis due to inhalation of food and vomit: Secondary | ICD-10-CM | POA: Diagnosis not present

## 2024-10-30 LAB — GLUCOSE, CAPILLARY
Glucose-Capillary: 101 mg/dL — ABNORMAL HIGH (ref 70–99)
Glucose-Capillary: 91 mg/dL (ref 70–99)
Glucose-Capillary: 99 mg/dL (ref 70–99)

## 2024-10-30 NOTE — Progress Notes (Signed)
 " PROGRESS NOTE    Marcus Powers  FMW:[id number] DOB: 1928-07-14 DOA: 10/26/2024 PCP: Charlott Dorn LABOR, MD  Brief Narrative: 89 yo male with a history of hypertension severe aortic stenosis TAVR for spinal stenosis chronic back pain chronic systolic heart failure admitted with cough and congestion.  At his baseline he is able to ambulate inside the house with a walker he is able to eat although they have noticed that he coughs more at dinner while eating.  He just lost his wife last month with dementia.  Since he has been sitting around more than before and complains of chronic back pain.  CT on admission was concerning for right main bronchus layering of fluid with right lower lobe as well as posterior basal subsegmental bronchial consolidation consistent with aspiration.  Patient has been seen by speech therapy and modified barium swallow has been done and recommending a regular diet with thin liquids.  Patient is DNR DNI.  Family was also concerned about if patient had a stroke as he was trying to eat at home he would take the food to the side of his face instead of to his mouth and some disorientation.  No focal weakness at home.  CT of the head this admission shows cortical atrophy with no acute changes.  MRI MRA in 2023 noted with the same changes.  Assessment & Plan:   Principal Problem:   CAP (community acquired pneumonia) Active Problems:   Chronic heart failure with preserved ejection fraction (HCC)   Essential hypertension   Hyperlipidemia   Normocytic anemia   Aspiration pneumonia of right lung (HCC)   Delirium  #1 pneumonia likely aspiration, patient admitted with congestion and cough.  Per family he coughs with dinner every night and not with lunch and breakfast.  MBS noted with moderate oropharyngeal dysphagia suspected to be chronic in nature due to protruding extensions from his cervical spine and prominent cricopharyngeus that limited bolus transit through the pharynx.   He  was not hypoxic on admission.  No leukocytosis or lactic acidosis on admit Continue Unasyn  3 g IV Q6.  Appreciate speech consult.  Per nursing staff patient is choking on soft diet.  He is used to having a denture at home which he does not have it currently.  Currently on dysphagia 3 diet. Continue neb treatments  Encourage I-S when able.  #2 Chronic heart failure with preserved ejection fraction (HCC) Seems to be euvolemic. Torsemide  has been held.? Unclear if he was taking it at home..  He appears more on the dry side. Continue carvedilol  6.25 mg p.o. twice daily.    # 3 Essential hypertension Continue coreg      # 4 Hyperlipidemia Continue ezetimibe  10 mg p.o. daily.   # 5  Normocytic anemia Monitor hematocrit and hemoglobin.   Estimated body mass index is 23.3 kg/m as calculated from the following:   Height as of 04/23/23: 5' 5 (1.651 m).   Weight as of 04/23/23: 63.5 kg.  DVT prophylaxis: lovenox  Code Status: dnr Family Communication: dw daughter and SIL Disposition Plan:  Status is: Observation   Consultants: NONE  Procedures:MBS Antimicrobials UNASYN   Subjective:  No new events overnight he is resting comfortably on room air Objective: Vitals:   10/28/24 2102 10/29/24 0831 10/29/24 1546 10/30/24 0653  BP:  (!) 151/75  119/86  Pulse:  86  80  Resp:  18  16  Temp:    98.7 F (37.1 C)  TempSrc:      SpO2: 97%  97% 94% 96%    Intake/Output Summary (Last 24 hours) at 10/30/2024 1015 Last data filed at 10/30/2024 0801 Gross per 24 hour  Intake 560 ml  Output 975 ml  Net -415 ml   There were no vitals filed for this visit.  Examination:  General exam: Appears in no acute distress Respiratory system: Clear to auscultation. Respiratory effort normal. Cardiovascular system: tachy Gastrointestinal system: Abdomen is nondistended, soft and nontender. No organomegaly or masses felt. Normal bowel sounds heard. Central nervous system: agitated. Extremities: no  edema   Data Reviewed: I have personally reviewed following labs and imaging studies  CBC: Recent Labs  Lab 10/26/24 2303 10/28/24 0616  WBC 7.3 9.3  NEUTROABS 5.0  --   HGB 12.0* 12.0*  HCT 36.3* 37.0*  MCV 100.0 99.7  PLT 201 190   Basic Metabolic Panel: Recent Labs  Lab 10/26/24 2303 10/28/24 0616  NA 140 143  K 4.4 3.9  CL 106 107  CO2 27 22  GLUCOSE 122* 103*  BUN 24* 15  CREATININE 1.11 0.98  CALCIUM  9.2 9.7   GFR: CrCl cannot be calculated (Unknown ideal weight.). Liver Function Tests: Recent Labs  Lab 10/26/24 2303 10/28/24 0616  AST 27 27  ALT 24 18  ALKPHOS 77 76  BILITOT 0.7 0.9  PROT 6.8 7.0  ALBUMIN 4.0 4.0   No results for input(s): LIPASE, AMYLASE in the last 168 hours. No results for input(s): AMMONIA in the last 168 hours. Coagulation Profile: No results for input(s): INR, PROTIME in the last 168 hours. Cardiac Enzymes: No results for input(s): CKTOTAL, CKMB, CKMBINDEX, TROPONINI in the last 168 hours. BNP (last 3 results) No results for input(s): PROBNP in the last 8760 hours. HbA1C: No results for input(s): HGBA1C in the last 72 hours. CBG: Recent Labs  Lab 10/28/24 2358 10/29/24 0609 10/29/24 1211 10/29/24 1654 10/30/24 0706  GLUCAP 112* 93 133* 101* 91   Lipid Profile: No results for input(s): CHOL, HDL, LDLCALC, TRIG, CHOLHDL, LDLDIRECT in the last 72 hours. Thyroid  Function Tests: No results for input(s): TSH, T4TOTAL, FREET4, T3FREE, THYROIDAB in the last 72 hours. Anemia Panel: No results for input(s): VITAMINB12, FOLATE, FERRITIN, TIBC, IRON, RETICCTPCT in the last 72 hours. Sepsis Labs: Recent Labs  Lab 10/27/24 0120  LATICACIDVEN 0.7    Recent Results (from the past 240 hours)  Resp panel by RT-PCR (RSV, Flu A&B, Covid) Anterior Nasal Swab     Status: None   Collection Time: 10/27/24  1:05 AM   Specimen: Anterior Nasal Swab  Result Value Ref Range  Status   SARS Coronavirus 2 by RT PCR NEGATIVE NEGATIVE Final    Comment: (NOTE) SARS-CoV-2 target nucleic acids are NOT DETECTED.  The SARS-CoV-2 RNA is generally detectable in upper respiratory specimens during the acute phase of infection. The lowest concentration of SARS-CoV-2 viral copies this assay can detect is 138 copies/mL. A negative result does not preclude SARS-Cov-2 infection and should not be used as the sole basis for treatment or other patient management decisions. A negative result may occur with  improper specimen collection/handling, submission of specimen other than nasopharyngeal swab, presence of viral mutation(s) within the areas targeted by this assay, and inadequate number of viral copies(<138 copies/mL). A negative result must be combined with clinical observations, patient history, and epidemiological information. The expected result is Negative.  Fact Sheet for Patients:  bloggercourse.com  Fact Sheet for Healthcare Providers:  seriousbroker.it  This test is no t yet approved or cleared by  the [place]  FDA and  has been authorized for detection and/or diagnosis of SARS-CoV-2 by FDA under an Emergency Use Authorization (EUA). This EUA will remain  in effect (meaning this test can be used) for the duration of the COVID-19 declaration under Section 564(b)(1) of the Act, 21 U.S.C.section 360bbb-3(b)(1), unless the authorization is terminated  or revoked sooner.       Influenza A by PCR NEGATIVE NEGATIVE Final   Influenza B by PCR NEGATIVE NEGATIVE Final    Comment: (NOTE) The Xpert Xpress SARS-CoV-2/FLU/RSV plus assay is intended as an aid in the diagnosis of influenza from Nasopharyngeal swab specimens and should not be used as a sole basis for treatment. Nasal washings and aspirates are unacceptable for Xpert Xpress SARS-CoV-2/FLU/RSV testing.  Fact Sheet for  Patients: bloggercourse.com  Fact Sheet for Healthcare Providers: seriousbroker.it  This test is not yet approved or cleared by the [place]  FDA and has been authorized for detection and/or diagnosis of SARS-CoV-2 by FDA under an Emergency Use Authorization (EUA). This EUA will remain in effect (meaning this test can be used) for the duration of the COVID-19 declaration under Section 564(b)(1) of the Act, 21 U.S.C. section 360bbb-3(b)(1), unless the authorization is terminated or revoked.     Resp Syncytial Virus by PCR NEGATIVE NEGATIVE Final    Comment: (NOTE) Fact Sheet for Patients: bloggercourse.com  Fact Sheet for Healthcare Providers: seriousbroker.it  This test is not yet approved or cleared by the [place]  FDA and has been authorized for detection and/or diagnosis of SARS-CoV-2 by FDA under an Emergency Use Authorization (EUA). This EUA will remain in effect (meaning this test can be used) for the duration of the COVID-19 declaration under Section 564(b)(1) of the Act, 21 U.S.C. section 360bbb-3(b)(1), unless the authorization is terminated or revoked.  Performed at [facility], [address] [place], [place] [address]   Blood Culture (routine x 2)     Status: None (Preliminary result)   Collection Time: 10/27/24  1:14 AM   Specimen: BLOOD  Result Value Ref Range Status   Specimen Description   Final    BLOOD LEFT ANTECUBITAL Performed at [facility], [address], [place], [place] [address]    Special Requests   Final    BOTTLES DRAWN AEROBIC AND ANAEROBIC Blood Culture results may not be optimal due to an inadequate volume of blood received in culture bottles Performed at [facility], [address] [place], [place] [address]    Culture   Final    NO GROWTH 2 DAYS Performed at [facility], [address], [place], [place] [address]    Report Status PENDING  Incomplete  Blood Culture (routine x 2)     Status: None (Preliminary result)   Collection Time: 10/27/24  2:05 PM   Specimen: BLOOD RIGHT HAND  Result Value Ref Range Status   Specimen Description   Final    BLOOD RIGHT HAND Performed at [facility], [address], [place], [place] [address]    Special Requests   Final    BOTTLES DRAWN AEROBIC ONLY Blood Culture adequate volume Performed at [facility], [address], [place], [place] [address]    Culture   Final    NO GROWTH 2 DAYS Performed at [facility], [address] [place], [place] [address]    Report Status PENDING  Incomplete  Radiology Studies: No results found.   Scheduled Meds:  acetaminophen   1,000 mg Oral Q8H   aspirin  EC  81 mg Oral Daily   carvedilol   6.25 mg Oral BID WC   enoxaparin  (LOVENOX ) injection  40 mg Subcutaneous Q24H   ezetimibe   10 mg Oral Daily   gabapentin   100 mg Oral BID   QUEtiapine   12.5 mg Oral QHS   rosuvastatin   20 mg Oral Daily   Continuous Infusions:  ampicillin -sulbactam (UNASYN ) IV 3 g (10/30/24 0455)     LOS: 2 days    Almarie KANDICE Hoots, MD  10/30/2024, 10:15 AM   "

## 2024-10-31 ENCOUNTER — Other Ambulatory Visit (HOSPITAL_COMMUNITY): Payer: Self-pay

## 2024-10-31 DIAGNOSIS — J690 Pneumonitis due to inhalation of food and vomit: Secondary | ICD-10-CM | POA: Diagnosis not present

## 2024-10-31 LAB — CBC
HCT: 37.1 % — ABNORMAL LOW (ref 39.0–52.0)
Hemoglobin: 12.1 g/dL — ABNORMAL LOW (ref 13.0–17.0)
MCH: 32.4 pg (ref 26.0–34.0)
MCHC: 32.6 g/dL (ref 30.0–36.0)
MCV: 99.2 fL (ref 80.0–100.0)
Platelets: 172 10*3/uL (ref 150–400)
RBC: 3.74 MIL/uL — ABNORMAL LOW (ref 4.22–5.81)
RDW: 12.8 % (ref 11.5–15.5)
WBC: 7.8 10*3/uL (ref 4.0–10.5)
nRBC: 0 % (ref 0.0–0.2)

## 2024-10-31 LAB — COMPREHENSIVE METABOLIC PANEL WITH GFR
ALT: 23 U/L (ref 0–44)
AST: 44 U/L — ABNORMAL HIGH (ref 15–41)
Albumin: 3.8 g/dL (ref 3.5–5.0)
Alkaline Phosphatase: 75 U/L (ref 38–126)
Anion gap: 13 (ref 5–15)
BUN: 14 mg/dL (ref 8–23)
CO2: 23 mmol/L (ref 22–32)
Calcium: 9.3 mg/dL (ref 8.9–10.3)
Chloride: 108 mmol/L (ref 98–111)
Creatinine, Ser: 1.05 mg/dL (ref 0.61–1.24)
GFR, Estimated: >60 mL/min
Glucose, Bld: 87 mg/dL (ref 70–99)
Potassium: 4 mmol/L (ref 3.5–5.1)
Sodium: 143 mmol/L (ref 135–145)
Total Bilirubin: 1.2 mg/dL (ref 0.0–1.2)
Total Protein: 6.7 g/dL (ref 6.5–8.1)

## 2024-10-31 LAB — GLUCOSE, CAPILLARY
Glucose-Capillary: 107 mg/dL — ABNORMAL HIGH (ref 70–99)
Glucose-Capillary: 91 mg/dL (ref 70–99)

## 2024-10-31 MED ORDER — AMOXICILLIN-POT CLAVULANATE 875-125 MG PO TABS
1.0000 | ORAL_TABLET | Freq: Two times a day (BID) | ORAL | 0 refills | Status: AC
  Filled 2024-10-31: qty 6, 3d supply, fill #0

## 2024-10-31 NOTE — Progress Notes (Signed)
 " PROGRESS NOTE    Marcus Powers  FMW:[id number] DOB: April 20, 1928 DOA: 10/26/2024 PCP: Charlott Dorn LABOR, MD  Brief Narrative: 89 yo male with a history of hypertension severe aortic stenosis TAVR for spinal stenosis chronic back pain chronic systolic heart failure admitted with cough and congestion.  At his baseline he is able to ambulate inside the house with a walker he is able to eat although they have noticed that he coughs more at dinner while eating.  He just lost his wife last month with dementia.  Since he has been sitting around more than before and complains of chronic back pain.  CT on admission was concerning for right main bronchus layering of fluid with right lower lobe as well as posterior basal subsegmental bronchial consolidation consistent with aspiration.  Patient has been seen by speech therapy and modified barium swallow has been done and recommending a regular diet with thin liquids.  Patient is DNR DNI.  Family was also concerned about if patient had a stroke as he was trying to eat at home he would take the food to the side of his face instead of to his mouth and some disorientation.  No focal weakness at home.  CT of the head this admission shows cortical atrophy with no acute changes.  MRI MRA in 2023 noted with the same changes.  Assessment & Plan:   Principal Problem:   CAP (community acquired pneumonia) Active Problems:   Chronic heart failure with preserved ejection fraction (HCC)   Essential hypertension   Hyperlipidemia   Normocytic anemia   Aspiration pneumonia of right lung (HCC)   Delirium  #1 pneumonia likely aspiration, patient admitted with congestion and cough.  Per family he coughs with dinner every night and not with lunch and breakfast.  MBS noted with moderate oropharyngeal dysphagia suspected to be chronic in nature due to protruding extensions from his cervical spine and prominent cricopharyngeus that limited bolus transit through the pharynx.   He  was not hypoxic on admission.  No leukocytosis or lactic acidosis on admit Continue Unasyn  3 g IV Q6.  Appreciate speech consult.  Per nursing staff patient is choking on soft diet.  He is used to having a denture at home which he does not have it currently.  Currently on dysphagia 3 diet. Continue neb treatments  Encourage I-S when able. He remains on room air.  Plan to discharge him home tomorrow with home health.  #2 Chronic heart failure with preserved ejection fraction (HCC) Seems to be euvolemic. Torsemide  has been held.? Unclear if he was taking it at home..  He appears more on the dry side. Continue carvedilol  6.25 mg p.o. twice daily.    # 3 Essential hypertension Continue coreg      # 4 Hyperlipidemia Continue ezetimibe  10 mg p.o. daily.   # 5  Normocytic anemia Monitor hematocrit and hemoglobin.   Estimated body mass index is 23.3 kg/m as calculated from the following:   Height as of 04/23/23: 5' 5 (1.651 m).   Weight as of 04/23/23: 63.5 kg.  DVT prophylaxis: lovenox  Code Status: dnr Family Communication: dw daughter and SIL Disposition Plan:  Status is: Observation   Consultants: NONE  Procedures:MBS Antimicrobials UNASYN   Subjective:  Patient is resting in bed no overnight events reported by the nursing staff Objective: Vitals:   10/30/24 1301 10/30/24 1725 10/30/24 1932 10/31/24 0550  BP: (!) 163/76 (!) 166/81 (!) 168/98 (!) 145/78  Pulse: 94  97 87  Resp: 20  18 17  Temp: 98.3 F (36.8 C)  98.3 F (36.8 C) 97.8 F (36.6 C)  TempSrc:      SpO2: 96%  97% 98%    Intake/Output Summary (Last 24 hours) at 10/31/2024 1000 Last data filed at 10/31/2024 0551 Gross per 24 hour  Intake 240 ml  Output 750 ml  Net -510 ml   There were no vitals filed for this visit.  Examination:  General exam: Appears in no acute distress Respiratory system: Clear to auscultation. Respiratory effort normal. Cardiovascular system: tachy Gastrointestinal system:  Abdomen is nondistended, soft and nontender. No organomegaly or masses felt. Normal bowel sounds heard. Central nervous system: agitated. Extremities: no edema   Data Reviewed: I have personally reviewed following labs and imaging studies  CBC: Recent Labs  Lab 10/26/24 2303 10/28/24 0616  WBC 7.3 9.3  NEUTROABS 5.0  --   HGB 12.0* 12.0*  HCT 36.3* 37.0*  MCV 100.0 99.7  PLT 201 190   Basic Metabolic Panel: Recent Labs  Lab 10/26/24 2303 10/28/24 0616  NA 140 143  K 4.4 3.9  CL 106 107  CO2 27 22  GLUCOSE 122* 103*  BUN 24* 15  CREATININE 1.11 0.98  CALCIUM  9.2 9.7   GFR: CrCl cannot be calculated (Unknown ideal weight.). Liver Function Tests: Recent Labs  Lab 10/26/24 2303 10/28/24 0616  AST 27 27  ALT 24 18  ALKPHOS 77 76  BILITOT 0.7 0.9  PROT 6.8 7.0  ALBUMIN 4.0 4.0   No results for input(s): LIPASE, AMYLASE in the last 168 hours. No results for input(s): AMMONIA in the last 168 hours. Coagulation Profile: No results for input(s): INR, PROTIME in the last 168 hours. Cardiac Enzymes: No results for input(s): CKTOTAL, CKMB, CKMBINDEX, TROPONINI in the last 168 hours. BNP (last 3 results) No results for input(s): PROBNP in the last 8760 hours. HbA1C: No results for input(s): HGBA1C in the last 72 hours. CBG: Recent Labs  Lab 10/29/24 1654 10/30/24 0706 10/30/24 1149 10/30/24 2345 10/31/24 0548  GLUCAP 101* 91 99 101* 107*   Lipid Profile: No results for input(s): CHOL, HDL, LDLCALC, TRIG, CHOLHDL, LDLDIRECT in the last 72 hours. Thyroid  Function Tests: No results for input(s): TSH, T4TOTAL, FREET4, T3FREE, THYROIDAB in the last 72 hours. Anemia Panel: No results for input(s): VITAMINB12, FOLATE, FERRITIN, TIBC, IRON, RETICCTPCT in the last 72 hours. Sepsis Labs: Recent Labs  Lab 10/27/24 0120  LATICACIDVEN 0.7    Recent Results (from the past 240 hours)  Resp panel by RT-PCR  (RSV, Flu A&B, Covid) Anterior Nasal Swab     Status: None   Collection Time: 10/27/24  1:05 AM   Specimen: Anterior Nasal Swab  Result Value Ref Range Status   SARS Coronavirus 2 by RT PCR NEGATIVE NEGATIVE Final    Comment: (NOTE) SARS-CoV-2 target nucleic acids are NOT DETECTED.  The SARS-CoV-2 RNA is generally detectable in upper respiratory specimens during the acute phase of infection. The lowest concentration of SARS-CoV-2 viral copies this assay can detect is 138 copies/mL. A negative result does not preclude SARS-Cov-2 infection and should not be used as the sole basis for treatment or other patient management decisions. A negative result may occur with  improper specimen collection/handling, submission of specimen other than nasopharyngeal swab, presence of viral mutation(s) within the areas targeted by this assay, and inadequate number of viral copies(<138 copies/mL). A negative result must be combined with clinical observations, patient history, and epidemiological information. The expected result is Negative.  Fact Sheet for Patients:  bloggercourse.com  Fact Sheet for Healthcare Providers:  seriousbroker.it  This test is no t yet approved or cleared by the [place]  FDA and  has been authorized for detection and/or diagnosis of SARS-CoV-2 by FDA under an Emergency Use Authorization (EUA). This EUA will remain  in effect (meaning this test can be used) for the duration of the COVID-19 declaration under Section 564(b)(1) of the Act, 21 U.S.C.section 360bbb-3(b)(1), unless the authorization is terminated  or revoked sooner.       Influenza A by PCR NEGATIVE NEGATIVE Final   Influenza B by PCR NEGATIVE NEGATIVE Final    Comment: (NOTE) The Xpert Xpress SARS-CoV-2/FLU/RSV plus assay is intended as an aid in the diagnosis of influenza from Nasopharyngeal swab specimens and should not be used as a sole basis for  treatment. Nasal washings and aspirates are unacceptable for Xpert Xpress SARS-CoV-2/FLU/RSV testing.  Fact Sheet for Patients: bloggercourse.com  Fact Sheet for Healthcare Providers: seriousbroker.it  This test is not yet approved or cleared by the [place]  FDA and has been authorized for detection and/or diagnosis of SARS-CoV-2 by FDA under an Emergency Use Authorization (EUA). This EUA will remain in effect (meaning this test can be used) for the duration of the COVID-19 declaration under Section 564(b)(1) of the Act, 21 U.S.C. section 360bbb-3(b)(1), unless the authorization is terminated or revoked.     Resp Syncytial Virus by PCR NEGATIVE NEGATIVE Final    Comment: (NOTE) Fact Sheet for Patients: bloggercourse.com  Fact Sheet for Healthcare Providers: seriousbroker.it  This test is not yet approved or cleared by the [place]  FDA and has been authorized for detection and/or diagnosis of SARS-CoV-2 by FDA under an Emergency Use Authorization (EUA). This EUA will remain in effect (meaning this test can be used) for the duration of the COVID-19 declaration under Section 564(b)(1) of the Act, 21 U.S.C. section 360bbb-3(b)(1), unless the authorization is terminated or revoked.  Performed at [facility], [address], [place], [place] [address]   Blood Culture (routine x 2)     Status: None (Preliminary result)   Collection Time: 10/27/24  1:14 AM   Specimen: BLOOD  Result Value Ref Range Status   Specimen Description   Final    BLOOD LEFT ANTECUBITAL Performed at [facility], [address], [place], [place] [address]    Special Requests   Final    BOTTLES DRAWN AEROBIC AND ANAEROBIC Blood Culture results may not be optimal due to an inadequate volume of blood received in culture bottles Performed at [facility], [address] [place], [place] [address]    Culture   Final    NO GROWTH 3 DAYS Performed at [facility], [address], [place], [place] [address]    Report Status PENDING  Incomplete  Blood Culture (routine x 2)     Status: None (Preliminary result)   Collection Time: 10/27/24  2:05 PM   Specimen: BLOOD RIGHT HAND  Result Value Ref Range Status   Specimen Description   Final    BLOOD RIGHT HAND Performed at [facility], [address] [place], [place] [address]    Special Requests   Final    BOTTLES DRAWN AEROBIC ONLY Blood Culture adequate volume Performed at [facility], [address], [place], [place] [address]    Culture   Final    NO GROWTH 3 DAYS Performed at [facility], [address] [place], [place] [address]    Report Status PENDING  Incomplete         Radiology Studies: No results found.   Scheduled Meds:  acetaminophen   1,000 mg Oral Q8H   aspirin  EC  81 mg Oral Daily   carvedilol   6.25 mg Oral BID WC   enoxaparin  (LOVENOX ) injection  40 mg Subcutaneous Q24H   ezetimibe   10 mg Oral Daily   gabapentin   100 mg Oral BID   QUEtiapine   12.5 mg Oral QHS   rosuvastatin   20 mg Oral Daily   Continuous Infusions:  ampicillin -sulbactam (UNASYN ) IV 3 g (10/31/24 0301)     LOS: 3 days    Almarie KANDICE Hoots, MD  10/31/2024, 10:00 AM   "

## 2024-10-31 NOTE — TOC Transition Note (Signed)
 Transition of Care [facility]) - Discharge Note   Patient Details  Name: Marcus Powers MRN: [id number] Date of Birth: [birth year]  Transition of Care [facility]) CM/SW Contact:  Heather DELENA Saltness, LCSW Phone Number: 10/31/2024, 1:16 PM   Clinical Narrative:    Pt discharging home today with [facility] PT/OT services through [place], spoke to [facility] who confirmed ability to accept pt for services. HH orders placed. Pt discharging home via [facility]. [facility] called at 1350. Pt's daughter, Arya Boxley [phone], and son-in-law, Nada Pizza [phone] made aware and in agreement with discharge plan. No further TOC needs at this time.   Final next level of care: Home w Home Health Services Barriers to Discharge: Barriers Resolved   Patient Goals and CMS Choice Patient states their goals for this hospitalization and ongoing recovery are:: To return home CMS Medicare.gov Compare Post Acute Care list provided to:: Patient Represenative (must comment) Choice offered to / list presented to : Adult Children [place] ownership interest in [facility].provided to:: Adult Children    Discharge Placement Home  Patient to be transferred to facility by: [facility] Name of family member notified: Kyra Milnes-White Patient and family notified of of transfer: 10/31/24  Discharge Plan and Services Additional resources added to the After Visit Summary for  Follow Up   Discharge Planning Services: CM Consult            DME Arranged: N/A DME Agency: NA       HH Arranged: PT, OT HH Agency: [facility] Date HH Agency Contacted: 10/31/24 Time HH Agency Contacted: 1312 Representative spoke with at [facility] Agency: [place]  Social Drivers of Health (SDOH) Interventions SDOH Screenings   Food Insecurity: No Food Insecurity (10/28/2024)  Housing: Low Risk (10/28/2024)  Transportation Needs: No Transportation Needs (10/28/2024)  Utilities: Not At Risk (10/28/2024)  Tobacco Use: Medium Risk (10/26/2024)      Readmission Risk Interventions    10/31/2024    1:16 PM  Readmission Risk Prevention Plan  Post Dischage Appt Complete  Medication Screening Complete  Transportation Screening Complete    Signed: Heather Saltness, MSW, LCSW Clinical Social Worker Inpatient Care Management 10/31/2024 1:56 PM

## 2024-10-31 NOTE — Plan of Care (Signed)
  Problem: Activity: Goal: Ability to tolerate increased activity will improve Outcome: Adequate for Discharge   Problem: Clinical Measurements: Goal: Ability to maintain a body temperature in the normal range will improve Outcome: Adequate for Discharge   Problem: Respiratory: Goal: Ability to maintain adequate ventilation will improve Outcome: Adequate for Discharge Goal: Ability to maintain a clear airway will improve Outcome: Adequate for Discharge   Problem: Education: Goal: Knowledge of General Education information will improve Description: Including pain rating scale, medication(s)/side effects and non-pharmacologic comfort measures Outcome: Adequate for Discharge   Problem: Health Behavior/Discharge Planning: Goal: Ability to manage health-related needs will improve Outcome: Adequate for Discharge   Problem: Clinical Measurements: Goal: Ability to maintain clinical measurements within normal limits will improve Outcome: Adequate for Discharge Goal: Will remain free from infection Outcome: Adequate for Discharge Goal: Diagnostic test results will improve Outcome: Adequate for Discharge Goal: Respiratory complications will improve Outcome: Adequate for Discharge Goal: Cardiovascular complication will be avoided Outcome: Adequate for Discharge   Problem: Activity: Goal: Risk for activity intolerance will decrease Outcome: Adequate for Discharge   Problem: Nutrition: Goal: Adequate nutrition will be maintained Outcome: Adequate for Discharge   Problem: Coping: Goal: Level of anxiety will decrease Outcome: Adequate for Discharge   Problem: Elimination: Goal: Will not experience complications related to bowel motility Outcome: Adequate for Discharge Goal: Will not experience complications related to urinary retention Outcome: Adequate for Discharge   Problem: Pain Managment: Goal: General experience of comfort will improve and/or be controlled Outcome:  Adequate for Discharge   Problem: Safety: Goal: Ability to remain free from injury will improve Outcome: Adequate for Discharge   Problem: Skin Integrity: Goal: Risk for impaired skin integrity will decrease Outcome: Adequate for Discharge

## 2024-10-31 NOTE — Progress Notes (Signed)
 Speech Language Pathology Treatment: Dysphagia  Patient Details Name: Keyvin Rison MRN: [id number] DOB: 1928/08/24 Today's Date: 10/31/2024 Time: 8869-8845 SLP Time Calculation (min) (ACUTE ONLY): 24 min  Assessment / Plan / Recommendation Clinical Impression  Downgraded diet to minced and moist solids due to pt not tolerating regular solids. Pt has missing dentition. Called daughter and asked her what she was aware of as recommendations. Reinforced oral care and rationale, MBS findings and known aspiration, second swallow is beneficial if possible, minced and moist solids suggested, but may need puree ultimately. Daughter aware. IDDSI recommendations at bedside for d/c.   HPI HPI: 89 yo male admitted from home 1/21 with AMS, decreased PO intake, cough, congestion. Pt found lying on the floor. CXR: no active disease, CTChest: possible aspiration. CTHead: no acute abnormality. SLE ordered 2023 but not completed. PMH: HTN, aortic stenosis, chronic diastolic heart failure, HLD, s/p TAVR (2023). Lungs coarse, afebrile.      SLP Plan  All goals met        Swallow Evaluation Recommendations   Recommendations: PO diet PO Diet Recommendation: Dysphagia 2 (Finely chopped);Dysphagia 1 (Pureed);Thin liquids (Level 0) Liquid Administration via: Straw Medication Administration: Crushed with puree Supervision: Full assist for feeding Postural changes: Position pt fully upright for meals Oral care recommendations: Oral care before PO     Recommendations                     Oral care before and after PO   Frequent or constant Supervision/Assistance       All goals met     Janeah Kovacich, Consuelo Fitch  10/31/2024, 11:57 AM

## 2024-10-31 NOTE — Progress Notes (Signed)
 Discharge medications delivered to patient at the bedside in a secure bag.

## 2024-11-01 LAB — CULTURE, BLOOD (ROUTINE X 2)
Culture: NO GROWTH
Culture: NO GROWTH
Special Requests: ADEQUATE

## 2024-11-01 NOTE — Discharge Summary (Addendum)
 Physician Discharge Summary  Marcus Powers FMW:[id number] DOB: Oct 07, 1927 DOA: 10/26/2024  PCP: Charlott Dorn LABOR, MD  Admit date: 10/26/2024 Discharge date: 10/31/2024 Admitted From: home Disposition: home Recommendations for Outpatient Follow-up:  Follow up with PCP in 1-2 weeks Please obtain BMP/CBC in one week  Home Health:pt Equipment/Devicesnone Discharge Condition:stable CODE STATUS:full Diet recommendation: cardiac Brief/Interim Summary:  89 yo male with a history of hypertension severe aortic stenosis TAVR for spinal stenosis chronic back pain chronic systolic heart failure admitted with cough and congestion. At his baseline he is able to ambulate inside the house with a walker he is able to eat although they have noticed that he coughs more at dinner while eating. He just lost his wife last month with dementia. Since he has been sitting around more than before and complains of chronic back pain. CT on admission was concerning for right main bronchus layering of fluid with right lower lobe as well as posterior basal subsegmental bronchial consolidation consistent with aspiration. Patient has been seen by speech therapy and modified barium swallow has been done and recommending a regular diet with thin liquids. Patient is DNR DNI. Family was also concerned about if patient had a stroke as he was trying to eat at home he would take the food to the side of his face instead of to his mouth and some disorientation. No focal weakness at home. CT of the head this admission shows cortical atrophy with no acute changes. MRI MRA in 2023 noted with the same changes.   Discharge Diagnoses:  Principal Problem:   CAP (community acquired pneumonia) Active Problems:   Chronic heart failure with preserved ejection fraction (HCC)   Essential hypertension   Hyperlipidemia   Normocytic anemia   Aspiration pneumonia of right lung (HCC)   Delirium     #1 pneumonia likely aspiration, patient  admitted with congestion and cough.  Per family he coughs with dinner every night  MBS noted with moderate oropharyngeal dysphagia suspected to be chronic in nature due to protruding extensions from his cervical spine and prominent cricopharyngeus that limited bolus transit through the pharynx.   He was not hypoxic on admission.  No leukocytosis or lactic acidosis on admit Treated with Unasyn  3 g IV Q6.  Discharged on augmentin .   #2 Chronic heart failure with preserved ejection fraction (HCC) Seems to be euvolemic. Continue carvedilol  6.25 mg p.o. twice daily.    # 3 Essential hypertension Continue coreg      # 4 Hyperlipidemia Continue ezetimibe  10 mg p.o. daily.   # 5  Normocytic anemia Monitor hematocrit and hemoglobin.    Estimated body mass index is 23.3 kg/m as calculated from the following:   Height as of 04/23/23: 5' 5 (1.651 m).   Weight as of 04/23/23: 63.5 kg.  Discharge Instructions  Discharge Instructions     Face-to-face encounter (required for Medicare/Medicaid patients)   Complete by: As directed    I Almarie KANDICE Hoots certify that this patient is under my care and that I, or a nurse practitioner or physician's assistant working with me, had a face-to-face encounter that meets the physician face-to-face encounter requirements with this patient on 10/31/2024. The encounter with the patient was in whole, or in part for the following medical condition(s) which is the primary reason for home health care (List medical condition): CAP   The encounter with the patient was in whole, or in part, for the following medical condition, which is the primary reason for home health care: CAP  I certify that, based on my findings, the following services are medically necessary home health services: Physical therapy   Reason for Medically Necessary Home Health Services: Therapy- Investment Banker, Operational, Patent Examiner   My clinical findings support the need for the above  services: Unable to leave home safely without assistance and/or assistive device   Further, I certify that my clinical findings support that this patient is homebound due to: Unable to leave home safely without assistance   Home Health   Complete by: As directed    To provide the following care/treatments:  PT OT     Increase activity slowly   Complete by: As directed       Allergies as of 10/31/2024       Reactions   Benazepril Hcl Cough        Medication List     STOP taking these medications    torsemide  20 MG tablet Commonly known as: DEMADEX        TAKE these medications    acetaminophen  650 MG CR tablet Commonly known as: TYLENOL  Take 1,300 mg by mouth in the morning and at bedtime.   amoxicillin -clavulanate 875-125 MG tablet Commonly known as: AUGMENTIN  Take 1 tablet by mouth 2 (two) times daily.   aspirin  81 MG chewable tablet Chew 1 tablet (81 mg total) by mouth daily.   carvedilol  6.25 MG tablet Commonly known as: COREG  Take 1 tablet (6.25 mg total) by mouth 2 (two) times daily with a meal.   ezetimibe  10 MG tablet Commonly known as: ZETIA  Take 10 mg by mouth daily.   gabapentin  100 MG capsule Commonly known as: NEURONTIN  Take 100 mg by mouth 2 (two) times daily.   guaifenesin 100 MG/5ML syrup Commonly known as: ROBITUSSIN Take by mouth daily as needed for cough or congestion. 1 dose oral daily as needed   rosuvastatin  20 MG tablet Commonly known as: CRESTOR  Take 1 tablet (20 mg total) by mouth daily.        Follow-up Information     [facility]) Follow up.   Specialty: Home Health Services Why: This provider will reach out to you to begin Home Health PT/OT services. Contact information: [address] [place]  [address] [phone]               Allergies[1]  Consultations:none   Procedures/Studies: DG Swallowing Func-Speech Pathology Result Date:  10/27/2024 Table formatting from the original result was not included. Modified Barium Swallow Study Patient Details Name: Marcus Powers MRN: [id number] Date of Birth: [birth year] Today's Date: 10/27/2024 HPI/PMH: HPI: 89 yo male admitted from home 1/21 with AMS, decreased PO intake, cough, congestion. Pt found lying on the floor. CXR: no active disease, CTChest: possible aspiration. CTHead: no acute abnormality. SLE ordered 2023 but not completed. PMH: HTN, aortic stenosis, chronic diastolic heart failure, HLD, s/p TAVR (2023). Lungs coarse, afebrile. Clinical Impression: Pt exhibits moderate oropharyngeal dysphagia, primarily impacted by efficiency. This is suspected to be chronic in nature due to protruding extensions from his cervical spine and a prominent cricopharyngeus that limit bolus transit through the pharynx. In addition to reduced BOT retraction and incomplete epiglottic inversion, this leads to diffuse pharyngeal residue. While there is penetration during the swallow with thin and nectar thick liquids, this increases in volume and progresses to the vocal folds after the swallow (PAS 5). As he continues swallowing and thus, more residue accumulates and spills over the epiglottis, trace aspiration occurs (  PAS 7). Cueing to use multiple swallows was effective in clearing the majority of pharyngeal residue when pt was attentive and able to follow commands. Thickened liquids significantly increase the amount of residue and while aspiration of nectar or honey thick liquids was not visualized, anticipate occurrence after the swallow throughout the course of a meal. Aspiration of thicker materials is thought to be more harmful to pulmonary health. Recommend he resume regular diet with thin liquids. Discussed with pt's family and MD. Encourage mobility and frequent oral care as well as full supervision for meals to cue pt to swallow multiple times.  DIGEST Swallow Severity Rating*             Safety: 2              Efficiency: 3             Overall Pharyngeal Swallow Severity: 2 (moderate) 1: mild; 2: moderate; 3: severe; 4: profound *The Dynamic Imaging Grade of Swallowing Toxicity is standardized for the head and neck cancer population, however, demonstrates promising clinical applications across populations to standardize the clinical rating of pharyngeal swallow safety and severity. Factors that may increase risk of adverse event in presence of aspiration Noe & Lianne 2021): Factors that may increase risk of adverse event in presence of aspiration Noe & Lianne 2021): Frail or deconditioned Recommendations/Plan: Swallowing Evaluation Recommendations Swallowing Evaluation Recommendations Recommendations: PO diet PO Diet Recommendation: Regular; Thin liquids (Level 0) Liquid Administration via: Cup; Straw Medication Administration: Whole meds with puree Supervision: Full assist for feeding; Full supervision/cueing for swallowing strategies Swallowing strategies  : Minimize environmental distractions; Slow rate; Small bites/sips Postural changes: Position pt fully upright for meals Oral care recommendations: Oral care BID (2x/day); Oral care before PO Recommended consults: Consider Palliative care Treatment Plan Treatment Plan Treatment recommendations: Therapy as outlined in treatment plan below Follow-up recommendations: Follow physicians's recommendations for discharge plan and follow up therapies Functional status assessment: Patient has had a recent decline in their functional status and demonstrates the ability to make significant improvements in function in a reasonable and predictable amount of time. Treatment frequency: Min 2x/week Treatment duration: 1 week Interventions: Aspiration precaution training; Patient/family education; Diet toleration management by SLP Recommendations Recommendations for follow up therapy are one component of a multi-disciplinary discharge planning process, led by the attending  physician.  Recommendations may be updated based on patient status, additional functional criteria and insurance authorization. Assessment: Orofacial Exam: Orofacial Exam Oral Cavity: Oral Hygiene: WFL Oral Cavity - Dentition: Missing dentition Orofacial Anatomy: WFL Oral Motor/Sensory Function: WFL Anatomy: Anatomy: Suspected cervical osteophytes; Prominent cricopharyngeus Boluses Administered: Boluses Administered Boluses Administered: Thin liquids (Level 0); Mildly thick liquids (Level 2, nectar thick); Moderately thick liquids (Level 3, honey thick); Puree; Solid  Oral Impairment Domain: Oral Impairment Domain Lip Closure: Escape progressing to mid-chin Tongue control during bolus hold: Not tested Bolus preparation/mastication: Timely and efficient chewing and mashing Bolus transport/lingual motion: Brisk tongue motion Oral residue: Trace residue lining oral structures Location of oral residue : Tongue Initiation of pharyngeal swallow : Pyriform sinuses  Pharyngeal Impairment Domain: Pharyngeal Impairment Domain Soft palate elevation: No bolus between soft palate (SP)/pharyngeal wall (PW) Laryngeal elevation: Complete superior movement of thyroid  cartilage with complete approximation of arytenoids to epiglottic petiole Anterior hyoid excursion: Complete anterior movement Epiglottic movement: Partial inversion Laryngeal vestibule closure: Incomplete, narrow column air/contrast in laryngeal vestibule Pharyngeal stripping wave : Present - diminished Pharyngeal contraction (A/P view only): N/A Pharyngoesophageal segment opening: Partial distention/partial duration,  partial obstruction of flow Tongue base retraction: Wide column of contrast or air between tongue base and PPW Pharyngeal residue: Collection of residue within or on pharyngeal structures Location of pharyngeal residue: Tongue base; Valleculae; Pharyngeal wall; Pyriform sinuses; Diffuse (>3 areas)  Esophageal Impairment Domain: No data recorded Pill: Pill  Consistency administered: Thin liquids (Level 0) Thin liquids (Level 0): Impaired (see clinical impressions) Penetration/Aspiration Scale Score: Penetration/Aspiration Scale Score 1.  Material does not enter airway: Moderately thick liquids (Level 3, honey thick); Puree; Solid; Pill 3.  Material enters airway, remains ABOVE vocal cords and not ejected out: Mildly thick liquids (Level 2, nectar thick) 7.  Material enters airway, passes BELOW cords and not ejected out despite cough attempt by patient: Thin liquids (Level 0) Compensatory Strategies: Compensatory Strategies Compensatory strategies: Yes Multiple swallows: Effective   General Information: Caregiver present: No  Diet Prior to this Study: NPO   Temperature : Normal   Respiratory Status: WFL   Supplemental O2: None (Room air)   History of Recent Intubation: No  Behavior/Cognition: Alert; Cooperative; Pleasant mood; Confused; Requires cueing; Distractible Self-Feeding Abilities: Needs set-up for self-feeding Baseline vocal quality/speech: Normal Volitional Cough: Able to elicit Volitional Swallow: Able to elicit Exam Limitations: No limitations Goal Planning: Prognosis for improved oropharyngeal function: Fair Barriers to Reach Goals: Time post onset No data recorded Patient/Family Stated Goal: none stated Consulted and agree with results and recommendations: Patient; Family member/caregiver; Physician Pain: Pain Assessment Pain Assessment: No/denies pain End of Session: Start Time:SLP Start Time (ACUTE ONLY): 1041 Stop Time: SLP Stop Time (ACUTE ONLY): 1057 Time Calculation:SLP Time Calculation (min) (ACUTE ONLY): 16 min Charges: SLP Evaluations $ SLP Speech Visit: 1 Visit SLP Evaluations $BSS Swallow: 1 Procedure $MBS Swallow: 1 Procedure SLP visit diagnosis: SLP Visit Diagnosis: Dysphagia, unspecified (R13.10) Past Medical History: Past Medical History: Diagnosis Date  Chronic diastolic heart failure (HCC)   Hyperlipidemia   Hypertension   S/P TAVR  (transcatheter aortic valve replacement) 01/28/2022  s/p TAVR with a 26 mm Edwards S3UR via the TF approach by Dr. Verlin & Dr. Lucas  Severe aortic stenosis   Spinal stenosis  Past Surgical History: Past Surgical History: Procedure Laterality Date  BIOPSY BREAST    EYE SURGERY Bilateral   RIGHT HEART CATH AND CORONARY ANGIOGRAPHY N/A 12/13/2021  Procedure: RIGHT HEART CATH AND CORONARY ANGIOGRAPHY;  Surgeon: Elmira Newman PARAS, MD;  Location: [facility];  Service: Cardiovascular;  Laterality: N/A;  TEE WITHOUT CARDIOVERSION N/A 01/28/2022  Procedure: TRANSESOPHAGEAL ECHOCARDIOGRAM (TEE);  Surgeon: Verlin Lonni BIRCH, MD;  Location: [facility] OR;  Service: Open Heart Surgery;  Laterality: N/A;  TONSILLECTOMY    TRANSCATHETER AORTIC VALVE REPLACEMENT, TRANSFEMORAL N/A 01/28/2022  Procedure: Transcatheter Aortic Valve Replacement, Transfemoral;  Surgeon: Verlin Lonni BIRCH, MD;  Location: [facility] OR;  Service: Open Heart Surgery;  Laterality: N/A;  ULTRASOUND GUIDANCE FOR VASCULAR ACCESS Bilateral 01/28/2022  Procedure: ULTRASOUND GUIDANCE FOR VASCULAR ACCESS;  Surgeon: Verlin Lonni BIRCH, MD;  Location: [facility] OR;  Service: Open Heart Surgery;  Laterality: Bilateral; Damien Blumenthal, M.A., CCC-SLP Speech Language Pathology, Acute Rehabilitation Services Secure Chat preferred [phone] 10/27/2024, 1:19 PM  CT Chest Wo Contrast Result Date: 10/27/2024 EXAM: CT CHEST WITHOUT CONTRAST 10/27/2024 02:36:24 AM TECHNIQUE: CT of the chest was performed without the administration of intravenous contrast. Multiplanar reformatted images are provided for review. Automated exposure control, iterative reconstruction, and/or weight based adjustment of the mA/kV was utilized to reduce the radiation dose to as low as reasonably achievable. COMPARISON: Portable chest  from today, portable chest 03/27/2022, CTA chest 12/13/2021. CLINICAL HISTORY: Productive cough for 5 days with shortness of breath. FINDINGS: MEDIASTINUM:  Cardiac size is normal. There is no substantial pericardial fluid. A TAVR has been inserted since the previous exam. There are left main and scattered 3-vessel coronary artery calcifications. The pulmonary veins are prominent but unchanged. The pulmonary arteries are normal caliber. There is moderate calcific plaque of the thoracic aorta and mild calcifications in the great vessels. No aortic aneurysm is seen. There is a mildly patulous esophagus, with scattered retained fluid but no wall thickening. This is unchanged. LYMPH NODES: No mediastinal, hilar or axillary lymphadenopathy. LUNGS AND PLEURA: There is diffuse bronchial thickening. There are mild paraseptal and centrilobular emphysematous changes in the lung apices. There is a stable 5 mm subpleural right upper lobe nodule on series 3 axial 38. Scarring changes are noted in the bases, greatest in the right middle lobe base. There is layering fluid in the right main bronchus extending into the right lower lobe bronchus, occasional small branch posterior basal subsegmental bronchial impactions. There is no consolidation. Trace pleural effusions. No pneumothorax. SOFT TISSUES/BONES: Osteopenia and degenerative change of the thoracic spine. There is an interval new but probably chronic mild upper plate anterior wedge compression fracture deformity of the T12 vertebral body. Mild thoracic kyphosis. Moderately advanced bilateral shoulder arthrosis. No acute abnormality of the soft tissues. UPPER ABDOMEN: There is a 6 cm cyst in the upper pole of the right kidney. No acute findings are seen in the upper abdomen. IMPRESSION: 1. Diffuse bronchial thickening with layering fluid in the right main bronchus extending into the right lower lobe bronchus and occasional posterior basal subsegmental bronchial impactions, without consolidation. Consider aspiration etiology. Aspiration precautions are suggested. 2. Trace pleural effusions. 3. Mild paraseptal and centrilobular  emphysema in the lung apices. 4. Stable 5 mm subpleural right upper lobe nodule, with no routine follow-up imaging recommended as per Fleischner Society Guidelines. 5. Basilar scarring, greatest at the right middle lobe base. 6. Chronic prominence in the pulmonary veins. No acute edema. Electronically signed by: Francis Quam MD 10/27/2024 03:10 AM EST RP Workstation: [id number]   CT Head Wo Contrast Result Date: 10/26/2024 EXAM: CT HEAD WITHOUT CONTRAST 10/26/2024 11:50:17 PM TECHNIQUE: CT of the head was performed without the administration of intravenous contrast. Automated exposure control, iterative reconstruction, and/or weight based adjustment of the mA/kV was utilized to reduce the radiation dose to as low as reasonably achievable. COMPARISON: 03/27/2022 CLINICAL HISTORY: Delirium FINDINGS: BRAIN AND VENTRICLES: No acute hemorrhage. No evidence of acute infarct. No hydrocephalus. No extra-axial collection. No mass effect or midline shift. Global cortical atrophy. Subcortical and periventricular small vessel ischemic changes. Atherosclerotic calcifications within the cavernous internal carotid and vertebral arteries. ORBITS: No acute abnormality. Bilateral lens replacement. SINUSES: No acute abnormality. SOFT TISSUES AND SKULL: No acute soft tissue abnormality. No skull fracture. IMPRESSION: 1. No acute intracranial abnormality. Electronically signed by: Pinkie Pebbles MD 10/26/2024 11:55 PM EST RP Workstation: [id number]   DG Chest Port 1 View Result Date: 10/26/2024 CLINICAL DATA:  Possible sepsis EXAM: PORTABLE CHEST 1 VIEW COMPARISON:  03/27/2022 FINDINGS: No focal opacity or pleural effusion. Valve prosthesis. Stable cardiomediastinal silhouette with aortic atherosclerosis. No pneumothorax IMPRESSION: No active disease. Electronically Signed   By: Luke Bun M.D.   On: 10/26/2024 23:27   (Echo, Carotid, EGD, Colonoscopy, ERCP)    Subjective:  Resting in bed no new events Discharge  Exam: Vitals:   10/30/24 1932 10/31/24 0550  BP: (!) 168/98 (!) 145/78  Pulse: 97 87  Resp: 18 17  Temp: 98.3 F (36.8 C) 97.8 F (36.6 C)  SpO2: 97% 98%   Vitals:   10/30/24 1301 10/30/24 1725 10/30/24 1932 10/31/24 0550  BP: (!) 163/76 (!) 166/81 (!) 168/98 (!) 145/78  Pulse: 94  97 87  Resp: 20  18 17   Temp: 98.3 F (36.8 C)  98.3 F (36.8 C) 97.8 F (36.6 C)  TempSrc:      SpO2: 96%  97% 98%    General: Pt is alert, awake, not in acute distress Cardiovascular: RRR, S1/S2 +, no rubs, no gallops Respiratory: CTA bilaterally, no wheezing, no rhonchi Abdominal: Soft, NT, ND, bowel sounds + Extremities: no edema, no cyanosis    The results of significant diagnostics from this hospitalization (including imaging, microbiology, ancillary and laboratory) are listed below for reference.     Microbiology: Recent Results (from the past 240 hours)  Resp panel by RT-PCR (RSV, Flu A&B, Covid) Anterior Nasal Swab     Status: None   Collection Time: 10/27/24  1:05 AM   Specimen: Anterior Nasal Swab  Result Value Ref Range Status   SARS Coronavirus 2 by RT PCR NEGATIVE NEGATIVE Final    Comment: (NOTE) SARS-CoV-2 target nucleic acids are NOT DETECTED.  The SARS-CoV-2 RNA is generally detectable in upper respiratory specimens during the acute phase of infection. The lowest concentration of SARS-CoV-2 viral copies this assay can detect is 138 copies/mL. A negative result does not preclude SARS-Cov-2 infection and should not be used as the sole basis for treatment or other patient management decisions. A negative result may occur with  improper specimen collection/handling, submission of specimen other than nasopharyngeal swab, presence of viral mutation(s) within the areas targeted by this assay, and inadequate number of viral copies(<138 copies/mL). A negative result must be combined with clinical observations, patient history, and epidemiological information. The expected  result is Negative.  Fact Sheet for Patients:  bloggercourse.com  Fact Sheet for Healthcare Providers:  seriousbroker.it  This test is no t yet approved or cleared by the [place]  FDA and  has been authorized for detection and/or diagnosis of SARS-CoV-2 by FDA under an Emergency Use Authorization (EUA). This EUA will remain  in effect (meaning this test can be used) for the duration of the COVID-19 declaration under Section 564(b)(1) of the Act, 21 U.S.C.section 360bbb-3(b)(1), unless the authorization is terminated  or revoked sooner.       Influenza A by PCR NEGATIVE NEGATIVE Final   Influenza B by PCR NEGATIVE NEGATIVE Final    Comment: (NOTE) The Xpert Xpress SARS-CoV-2/FLU/RSV plus assay is intended as an aid in the diagnosis of influenza from Nasopharyngeal swab specimens and should not be used as a sole basis for treatment. Nasal washings and aspirates are unacceptable for Xpert Xpress SARS-CoV-2/FLU/RSV testing.  Fact Sheet for Patients: bloggercourse.com  Fact Sheet for Healthcare Providers: seriousbroker.it  This test is not yet approved or cleared by the [place]  FDA and has been authorized for detection and/or diagnosis of SARS-CoV-2 by FDA under an Emergency Use Authorization (EUA). This EUA will remain in effect (meaning this test can be used) for the duration of the COVID-19 declaration under Section 564(b)(1) of the Act, 21 U.S.C. section 360bbb-3(b)(1), unless the authorization is terminated or revoked.     Resp Syncytial Virus by PCR NEGATIVE NEGATIVE Final    Comment: (NOTE) Fact Sheet for Patients: bloggercourse.com  Fact Sheet for Healthcare Providers: seriousbroker.it  This test is not yet approved or cleared by the [place]  FDA and has been authorized for detection and/or diagnosis of  SARS-CoV-2 by FDA under an Emergency Use Authorization (EUA). This EUA will remain in effect (meaning this test can be used) for the duration of the COVID-19 declaration under Section 564(b)(1) of the Act, 21 U.S.C. section 360bbb-3(b)(1), unless the authorization is terminated or revoked.  Performed at [facility], [address] [place], [place] [address]   Blood Culture (routine x 2)     Status: None   Collection Time: 10/27/24  1:14 AM   Specimen: BLOOD  Result Value Ref Range Status   Specimen Description   Final    BLOOD LEFT ANTECUBITAL Performed at [facility], [address], [place], [place] [address]    Special Requests   Final    BOTTLES DRAWN AEROBIC AND ANAEROBIC Blood Culture results may not be optimal due to an inadequate volume of blood received in culture bottles Performed at [facility], [address] [place], [place] [address]    Culture   Final    NO GROWTH 5 DAYS Performed at [facility], [address], [place], [place] [address]    Report Status 11/01/2024 FINAL  Final  Blood Culture (routine x 2)     Status: None   Collection Time: 10/27/24  2:05 PM   Specimen: BLOOD RIGHT HAND  Result Value Ref Range Status   Specimen Description   Final    BLOOD RIGHT HAND Performed at [facility], [address], [place], [place] [address]    Special Requests   Final    BOTTLES DRAWN AEROBIC ONLY Blood Culture adequate volume Performed at [facility], [address] [place], [place] [address]    Culture   Final    NO GROWTH 5 DAYS Performed at [facility], [address], [place], [place] [address]    Report Status 11/01/2024 FINAL  Final     Labs: BNP (last 3 results) No results for input(s): BNP in the last 8760 hours. Basic Metabolic Panel: Recent Labs  Lab 10/26/24 2303 10/28/24 0616 10/31/24 1025  NA 140 143 143  K 4.4 3.9 4.0  CL 106 107  108  CO2 27 22 23   GLUCOSE 122* 103* 87  BUN 24* 15 14  CREATININE 1.11 0.98 1.05  CALCIUM  9.2 9.7 9.3   Liver Function Tests: Recent Labs  Lab 10/26/24 2303 10/28/24 0616 10/31/24 1025  AST 27 27 44*  ALT 24 18 23   ALKPHOS 77 76 75  BILITOT 0.7 0.9 1.2  PROT 6.8 7.0 6.7  ALBUMIN 4.0 4.0 3.8   No results for input(s): LIPASE, AMYLASE in the last 168 hours. No results for input(s): AMMONIA in the last 168 hours. CBC: Recent Labs  Lab 10/26/24 2303 10/28/24 0616 10/31/24 1025  WBC 7.3 9.3 7.8  NEUTROABS 5.0  --   --   HGB 12.0* 12.0* 12.1*  HCT 36.3* 37.0* 37.1*  MCV 100.0 99.7 99.2  PLT 201 190 172   Cardiac Enzymes: No results for input(s): CKTOTAL, CKMB, CKMBINDEX, TROPONINI in the last 168 hours. BNP: Invalid input(s): POCBNP CBG: Recent Labs  Lab 10/30/24 0706 10/30/24 1149 10/30/24 2345 10/31/24 0548 10/31/24 1239  GLUCAP 91 99 101* 107* 91   D-Dimer No results for input(s): DDIMER in the last 72 hours. Hgb A1c No results for input(s): HGBA1C in the last  72 hours. Lipid Profile No results for input(s): CHOL, HDL, LDLCALC, TRIG, CHOLHDL, LDLDIRECT in the last 72 hours. Thyroid  function studies No results for input(s): TSH, T4TOTAL, T3FREE, THYROIDAB in the last 72 hours.  Invalid input(s): FREET3 Anemia work up No results for input(s): VITAMINB12, FOLATE, FERRITIN, TIBC, IRON, RETICCTPCT in the last 72 hours. Urinalysis    Component Value Date/Time   COLORURINE YELLOW 10/27/2024 0105   APPEARANCEUR CLEAR 10/27/2024 0105   LABSPEC 1.018 10/27/2024 0105   PHURINE 5.0 10/27/2024 0105   GLUCOSEU NEGATIVE 10/27/2024 0105   HGBUR NEGATIVE 10/27/2024 0105   BILIRUBINUR NEGATIVE 10/27/2024 0105   KETONESUR NEGATIVE 10/27/2024 0105   PROTEINUR NEGATIVE 10/27/2024 0105   NITRITE NEGATIVE 10/27/2024 0105   LEUKOCYTESUR NEGATIVE 10/27/2024 0105   Sepsis Labs Recent Labs  Lab 10/26/24 2303  10/28/24 0616 10/31/24 1025  WBC 7.3 9.3 7.8   Microbiology Recent Results (from the past 240 hours)  Resp panel by RT-PCR (RSV, Flu A&B, Covid) Anterior Nasal Swab     Status: None   Collection Time: 10/27/24  1:05 AM   Specimen: Anterior Nasal Swab  Result Value Ref Range Status   SARS Coronavirus 2 by RT PCR NEGATIVE NEGATIVE Final    Comment: (NOTE) SARS-CoV-2 target nucleic acids are NOT DETECTED.  The SARS-CoV-2 RNA is generally detectable in upper respiratory specimens during the acute phase of infection. The lowest concentration of SARS-CoV-2 viral copies this assay can detect is 138 copies/mL. A negative result does not preclude SARS-Cov-2 infection and should not be used as the sole basis for treatment or other patient management decisions. A negative result may occur with  improper specimen collection/handling, submission of specimen other than nasopharyngeal swab, presence of viral mutation(s) within the areas targeted by this assay, and inadequate number of viral copies(<138 copies/mL). A negative result must be combined with clinical observations, patient history, and epidemiological information. The expected result is Negative.  Fact Sheet for Patients:  bloggercourse.com  Fact Sheet for Healthcare Providers:  seriousbroker.it  This test is no t yet approved or cleared by the [place]  FDA and  has been authorized for detection and/or diagnosis of SARS-CoV-2 by FDA under an Emergency Use Authorization (EUA). This EUA will remain  in effect (meaning this test can be used) for the duration of the COVID-19 declaration under Section 564(b)(1) of the Act, 21 U.S.C.section 360bbb-3(b)(1), unless the authorization is terminated  or revoked sooner.       Influenza A by PCR NEGATIVE NEGATIVE Final   Influenza B by PCR NEGATIVE NEGATIVE Final    Comment: (NOTE) The Xpert Xpress SARS-CoV-2/FLU/RSV plus assay is  intended as an aid in the diagnosis of influenza from Nasopharyngeal swab specimens and should not be used as a sole basis for treatment. Nasal washings and aspirates are unacceptable for Xpert Xpress SARS-CoV-2/FLU/RSV testing.  Fact Sheet for Patients: bloggercourse.com  Fact Sheet for Healthcare Providers: seriousbroker.it  This test is not yet approved or cleared by the [place]  FDA and has been authorized for detection and/or diagnosis of SARS-CoV-2 by FDA under an Emergency Use Authorization (EUA). This EUA will remain in effect (meaning this test can be used) for the duration of the COVID-19 declaration under Section 564(b)(1) of the Act, 21 U.S.C. section 360bbb-3(b)(1), unless the authorization is terminated or revoked.     Resp Syncytial Virus by PCR NEGATIVE NEGATIVE Final    Comment: (NOTE) Fact Sheet for Patients: bloggercourse.com  Fact Sheet for Healthcare Providers: seriousbroker.it  This test  is not yet approved or cleared by the [place]  FDA and has been authorized for detection and/or diagnosis of SARS-CoV-2 by FDA under an Emergency Use Authorization (EUA). This EUA will remain in effect (meaning this test can be used) for the duration of the COVID-19 declaration under Section 564(b)(1) of the Act, 21 U.S.C. section 360bbb-3(b)(1), unless the authorization is terminated or revoked.  Performed at [facility], [address] [place], [place] [address]   Blood Culture (routine x 2)     Status: None   Collection Time: 10/27/24  1:14 AM   Specimen: BLOOD  Result Value Ref Range Status   Specimen Description   Final    BLOOD LEFT ANTECUBITAL Performed at [facility], [address] [place], [place] [address]    Special Requests   Final    BOTTLES DRAWN AEROBIC AND ANAEROBIC Blood Culture results may not be  optimal due to an inadequate volume of blood received in culture bottles Performed at [facility], [address], [place], [place] [address]    Culture   Final    NO GROWTH 5 DAYS Performed at [facility], [address], [place], [place] [address]    Report Status 11/01/2024 FINAL  Final  Blood Culture (routine x 2)     Status: None   Collection Time: 10/27/24  2:05 PM   Specimen: BLOOD RIGHT HAND  Result Value Ref Range Status   Specimen Description   Final    BLOOD RIGHT HAND Performed at [facility], [address] [place], [place] [address]    Special Requests   Final    BOTTLES DRAWN AEROBIC ONLY Blood Culture adequate volume Performed at [facility], [address], [place], [place] [address]    Culture   Final    NO GROWTH 5 DAYS Performed at [facility], [address], [place], [place] [address]    Report Status 11/01/2024 FINAL  Final     Time coordinating discharge: 40 minutes  SIGNED:   Almarie KANDICE Hoots, MD  [facility] 11/01/2024, 3:31 PM     [1]  Allergies Allergen Reactions   Benazepril Hcl Cough
